# Patient Record
Sex: Male | Born: 1970
Health system: Southern US, Community
[De-identification: ages and names within clinical notes are randomized; demographics above are authoritative.]

## PROBLEM LIST (undated history)

## (undated) DIAGNOSIS — I10 Essential (primary) hypertension: Secondary | ICD-10-CM

## (undated) DIAGNOSIS — D696 Thrombocytopenia, unspecified: Secondary | ICD-10-CM

## (undated) DIAGNOSIS — I509 Heart failure, unspecified: Secondary | ICD-10-CM

## (undated) DIAGNOSIS — E669 Obesity, unspecified: Secondary | ICD-10-CM

## (undated) DIAGNOSIS — E119 Type 2 diabetes mellitus without complications: Secondary | ICD-10-CM

---

## 2006-12-29 ENCOUNTER — Emergency Department (HOSPITAL_COMMUNITY): Admission: EM | Admit: 2006-12-29 | Discharge: 2006-12-29 | Payer: Self-pay | Admitting: Emergency Medicine

## 2008-01-18 ENCOUNTER — Emergency Department (HOSPITAL_COMMUNITY): Admission: EM | Admit: 2008-01-18 | Discharge: 2008-01-18 | Payer: Self-pay | Admitting: Emergency Medicine

## 2008-08-02 ENCOUNTER — Emergency Department (HOSPITAL_COMMUNITY): Admission: EM | Admit: 2008-08-02 | Discharge: 2008-08-03 | Payer: Self-pay | Admitting: Emergency Medicine

## 2008-08-05 ENCOUNTER — Emergency Department (HOSPITAL_COMMUNITY): Admission: EM | Admit: 2008-08-05 | Discharge: 2008-08-05 | Payer: Self-pay | Admitting: Emergency Medicine

## 2008-08-06 ENCOUNTER — Emergency Department (HOSPITAL_COMMUNITY): Admission: EM | Admit: 2008-08-06 | Discharge: 2008-08-06 | Payer: Self-pay | Admitting: Emergency Medicine

## 2010-03-15 ENCOUNTER — Emergency Department (HOSPITAL_COMMUNITY)
Admission: EM | Admit: 2010-03-15 | Discharge: 2010-03-15 | Payer: Self-pay | Source: Home / Self Care | Admitting: Emergency Medicine

## 2010-04-22 ENCOUNTER — Emergency Department (HOSPITAL_COMMUNITY)
Admission: EM | Admit: 2010-04-22 | Discharge: 2010-04-22 | Payer: Self-pay | Source: Home / Self Care | Admitting: Emergency Medicine

## 2010-04-25 ENCOUNTER — Emergency Department (HOSPITAL_COMMUNITY)
Admission: EM | Admit: 2010-04-25 | Discharge: 2010-04-25 | Payer: Self-pay | Source: Home / Self Care | Admitting: Emergency Medicine

## 2010-04-27 ENCOUNTER — Emergency Department (HOSPITAL_COMMUNITY)
Admission: EM | Admit: 2010-04-27 | Discharge: 2010-04-27 | Payer: Self-pay | Source: Home / Self Care | Admitting: Emergency Medicine

## 2010-07-09 LAB — CBC
HCT: 43.2 % (ref 39.0–52.0)
MCHC: 34.9 g/dL (ref 30.0–36.0)
Platelets: 108 10*3/uL — ABNORMAL LOW (ref 150–400)
RDW: 13.2 % (ref 11.5–15.5)

## 2010-07-09 LAB — DIFFERENTIAL
Basophils Absolute: 0 10*3/uL (ref 0.0–0.1)
Basophils Relative: 0 % (ref 0–1)
Eosinophils Relative: 0 % (ref 0–5)
Lymphocytes Relative: 26 % (ref 12–46)
Neutro Abs: 4.9 10*3/uL (ref 1.7–7.7)

## 2010-07-09 LAB — POCT I-STAT, CHEM 8
BUN: 17 mg/dL (ref 6–23)
Calcium, Ion: 1.1 mmol/L — ABNORMAL LOW (ref 1.12–1.32)
Chloride: 105 mEq/L (ref 96–112)
Creatinine, Ser: 0.9 mg/dL (ref 0.4–1.5)
Glucose, Bld: 86 mg/dL (ref 70–99)
TCO2: 28 mmol/L (ref 0–100)

## 2010-07-09 LAB — WOUND CULTURE

## 2010-07-09 LAB — CULTURE, BLOOD (ROUTINE X 2)

## 2010-08-13 NOTE — Op Note (Signed)
Dave Arnold, Dave Arnold NO.:  [id number]   MEDICAL RECORD NO.:  [id number]          PATIENT TYPE:  EMS   LOCATION:  MAJO                         FACILITY:  [facility]   PHYSICIAN:  Dionne Ano. Gramig, M.D.DATE OF BIRTH:  1971/01/18   DATE OF PROCEDURE:  DATE OF DISCHARGE:  08/05/2008                               OPERATIVE REPORT   PREOPERATIVE DIAGNOSIS:  Status post dog bite on Aug 03, 2008, with  subsequent abscess formation and ascending cellulitis.   POSTOPERATIVE DIAGNOSIS:  Status post dog bite on Aug 03, 2008, with  subsequent abscess formation and ascending cellulitis.   PROCEDURE:  Incision and drainage abscess x3.  This is due to 3 deep  lacerations secondary to a dog bite with ascending cellulitis.   SURGEON:  Dionne Ano. [name], MD   ASSISTANT:  None.   COMPLICATIONS:  None.   INDICATIONS FOR THE PROCEDURE:  The patient is a 40 year old male who  presents with the above-mentioned diagnosis.  I have counseled him in  regards to risks and benefits of the surgery including risk of  infection, bleeding, anesthesia, damage to normal structures, and  failure of surgery to accomplish its intended goals of relieving  symptoms and restoring function.  With this in mind, he decided to  proceed.  All questions have been encouraged and answered  preoperatively.   OPERATIVE PROCEDURE:  The patient was seen by myself and counseled  extensively.  He was then taken to the procedure suite and underwent  anesthetic in the form of a field block anesthesia with lidocaine  without epinephrine.  He tolerated this well.  Once this was done, he  was scrubbed with Betadine scrub and painted x2 with separate scrubs.  Once this was done, I then performed I&D of skin, subcutaneous tissue  and muscle about 3 distinct and separate dog bites, abscess was noted,  this was cultured and the culture was sent for aerobic and anaerobic  cultures.  Following this, I irrigated with greater  than 2 L of saline  and then packed the wounds open with Iodoform gauze.  He tolerated the  procedure well.  He was dressed with Neosporin on the skin, gauze,  Kerlix, and an Ace wrap.  Following this, I discussed with him Augmentin  875 mg 1 p.o. b.i.d.  I do not feel that he should substitute this and  he clearly understands this.  This is specific organisms such as  Pasteurella multocida and other noxious bacteria in a dog's mouth.  I  have discussed with the patient.  He should return to see me immediately  tomorrow morning.  Given his childcare issues and other issues, we are  going to allow him to have IV Zosyn in the ER and then follow up with [place]  tomorrow.  I have his name and number and we will call him to arrange  for followup.  We have gone over these issues in detail, the do's and  dont's etc.   The patient tolerated the procedure well with no complicating features.  This was I&D of 3 separate deep abscesses including skin, subcutaneous  tissue and muscle.      Dionne Ano. [name], M.D.  Electronically Signed     WMG/MEDQ  D:  08/05/2008  T:  08/06/2008  Job:  [id number]

## 2010-08-13 NOTE — Consult Note (Signed)
NAMEMASTER, TOUCHET NO.:  [id number]   MEDICAL RECORD NO.:  [id number]          PATIENT TYPE:  EMS   LOCATION:  MAJO                         FACILITY:  [facility]   PHYSICIAN:  Dionne Ano. Gramig, M.D.DATE OF BIRTH:  08/14/1970   DATE OF CONSULTATION:  DATE OF DISCHARGE:  08/05/2008                                 CONSULTATION   I had the pleasure of seeing Dave Arnold in the emergency room.  The patient was seen by emergency room staff.  I was asked to see and  take over his care given his upper extremity predicament.  He was bitten  by a dog on Aug 03, 2008.  He was seen in the emergency room.  He was  given Augmentin at that time, but later called back and was changed to  doxycycline as he was trying to find a cheaper antibiotic.  This was  reviewed with the ER staff.  Today, Dr. [name] saw the patient,  was concerned about his arm and asked me to review this.  On review, the  patient had obvious purulence in the forearm and I have discussed this  with the patient.  He denies numbness in the fingertips.  He is alert  and oriented.  The dog was a Engineer, water and has been picked up for  observation.  The patient does complain of pain and worsening with  ascending erythema and cellulitis.   PAST MEDICAL HISTORY:  None.   PAST SURGICAL HISTORY:  None.   MEDICINES:  None.   ALLERGIES:  None.   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  He does not smoke  or drink.  He has a 40 and a 84 year old daughter.  He is not married.   On exam, he is alert and oriented in no acute distress.  Vital signs  stable.  The patient has normal sensation to the hand, left upper  extremity.  He has 3 large wounds with purulence about the volar forearm  and multiple abrasions and scratches.  He has no evidence of instability  about the bony architecture.  The patient has ascending erythema and  cellulitis of the arm to the area  just proximal to the  antecubital  crease.  I have reviewed this with him at length and its findings.  Elbow is stable with ligamentous exam.  Right upper extremity is  neurovascularly intact, normal alignment and good range of motion.   IMPRESSION:  Status post dog bite on Aug 03, 2008, with abscess, volar  left forearm x3, separate bite wounds.   PLAN:  I have discussed with the patient his findings.  I have consented  him for I&D and repair as necessary.  He understands the risk and  benefits, and desires to proceed.      Dionne Ano. [name], M.D.  Electronically Signed    WMG/MEDQ  D:  08/05/2008  T:  08/06/2008  Job:  [id number]

## 2010-08-13 NOTE — Op Note (Signed)
[name], [name] NO.:  [id number]   MEDICAL RECORD NO.:  [id number]          PATIENT TYPE:  EMS   LOCATION:  MAJO                         FACILITY:  [facility]   PHYSICIAN:  Dionne Ano. Gramig, M.D.DATE OF BIRTH:  Aug 24, 1970   DATE OF PROCEDURE:  DATE OF DISCHARGE:                               OPERATIVE REPORT   Dave Arnold was seen at the [facility].  He was seen  yesterday and underwent I and D'd of the dog bite, IV antibiotics  secondary to ascending cellulitis and abscess.  He presents to the  emergency room this morning for repeat evaluation and I and D.   On repeat evaluation, erythema is less than significantly.  He is taking  the Augmentin as prescribed.   I have gone ahead and cleansed 3 separate dog bite wounds with skin,  subcutaneous tissue.  Excisional debridement of all three wounds, packed  this with Iodoform and cleansed the area nicely.  He is going to see me  back in my office tomorrow morning for additional wound check at 11 a.m.  Continue his Augmentin, elevation, and general postop care plan has been  instituted.   We will continue aggressive course of care for him in hopes to rid him  of his infection.  He has made ample improvement over the last 24 hours  and thus we will continue with outpatient postsurgical treatment plan.       Dionne Ano. [name], M.D.  Electronically Signed     WMG/MEDQ  D:  08/06/2008  T:  08/07/2008  Job:  [id number]

## 2010-12-31 LAB — DIFFERENTIAL
Eosinophils Absolute: 0
Eosinophils Relative: 0
Lymphocytes Relative: 23
Lymphs Abs: 2.6
Monocytes Relative: 4
Neutrophils Relative %: 73

## 2010-12-31 LAB — URINALYSIS, ROUTINE W REFLEX MICROSCOPIC
Leukocytes, UA: NEGATIVE
Nitrite: NEGATIVE
Specific Gravity, Urine: 1.03
Urobilinogen, UA: 1
pH: 6

## 2010-12-31 LAB — COMPREHENSIVE METABOLIC PANEL
ALT: 21
AST: 18
CO2: 26
Calcium: 8.7
Creatinine, Ser: 1.07
GFR calc Af Amer: >60
GFR calc non Af Amer: >60
Sodium: 141
Total Protein: 6.4

## 2010-12-31 LAB — CBC
MCHC: 34.6
MCV: 87
RBC: 4.96
RDW: 13.2

## 2010-12-31 LAB — URINE MICROSCOPIC-ADD ON

## 2010-12-31 LAB — LIPASE, BLOOD: Lipase: 12

## 2011-06-30 DIAGNOSIS — I509 Heart failure, unspecified: Secondary | ICD-10-CM

## 2011-06-30 DIAGNOSIS — I5021 Acute systolic (congestive) heart failure: Secondary | ICD-10-CM | POA: Diagnosis present

## 2011-06-30 HISTORY — DX: Heart failure, unspecified: I50.9

## 2011-07-18 ENCOUNTER — Inpatient Hospital Stay (HOSPITAL_COMMUNITY)
Admission: EM | Admit: 2011-07-18 | Discharge: 2011-07-21 | DRG: 292 | Disposition: A | Discharge status: Home / Self Care | Payer: Self-pay | Attending: Internal Medicine | Admitting: Internal Medicine

## 2011-07-18 ENCOUNTER — Encounter (HOSPITAL_COMMUNITY): Payer: Self-pay | Admitting: Adult Health

## 2011-07-18 ENCOUNTER — Emergency Department (HOSPITAL_COMMUNITY): Payer: Self-pay

## 2011-07-18 DIAGNOSIS — Z6841 Body Mass Index (BMI) 40.0 and over, adult: Secondary | ICD-10-CM

## 2011-07-18 DIAGNOSIS — R7309 Other abnormal glucose: Secondary | ICD-10-CM | POA: Diagnosis present

## 2011-07-18 DIAGNOSIS — I509 Heart failure, unspecified: Secondary | ICD-10-CM | POA: Diagnosis present

## 2011-07-18 DIAGNOSIS — I110 Hypertensive heart disease with heart failure: Principal | ICD-10-CM | POA: Diagnosis present

## 2011-07-18 DIAGNOSIS — I10 Essential (primary) hypertension: Secondary | ICD-10-CM | POA: Diagnosis present

## 2011-07-18 DIAGNOSIS — I5021 Acute systolic (congestive) heart failure: Secondary | ICD-10-CM | POA: Diagnosis present

## 2011-07-18 DIAGNOSIS — E876 Hypokalemia: Secondary | ICD-10-CM | POA: Diagnosis present

## 2011-07-18 DIAGNOSIS — G4733 Obstructive sleep apnea (adult) (pediatric): Secondary | ICD-10-CM | POA: Diagnosis present

## 2011-07-18 DIAGNOSIS — D696 Thrombocytopenia, unspecified: Secondary | ICD-10-CM | POA: Diagnosis present

## 2011-07-18 DIAGNOSIS — E669 Obesity, unspecified: Secondary | ICD-10-CM | POA: Diagnosis present

## 2011-07-18 DIAGNOSIS — I517 Cardiomegaly: Secondary | ICD-10-CM | POA: Diagnosis present

## 2011-07-18 HISTORY — DX: Heart failure, unspecified: I50.9

## 2011-07-18 HISTORY — DX: Obesity, unspecified: E66.9

## 2011-07-18 HISTORY — DX: Essential (primary) hypertension: I10

## 2011-07-18 HISTORY — DX: Thrombocytopenia, unspecified: D69.6

## 2011-07-18 LAB — DIFFERENTIAL
Basophils Relative: 1 % (ref 0–1)
Eosinophils Absolute: 0.1 10*3/uL (ref 0.0–0.7)
Eosinophils Relative: 1 % (ref 0–5)
Lymphs Abs: 1.2 10*3/uL (ref 0.7–4.0)
Monocytes Absolute: 0.7 10*3/uL (ref 0.1–1.0)

## 2011-07-18 LAB — BASIC METABOLIC PANEL
CO2: 27 mEq/L (ref 19–32)
Calcium: 9 mg/dL (ref 8.4–10.5)
Creatinine, Ser: 0.95 mg/dL (ref 0.50–1.35)
GFR calc non Af Amer: >90 mL/min (ref >90)
Glucose, Bld: 144 mg/dL — ABNORMAL HIGH (ref 70–99)
Sodium: 140 mEq/L (ref 135–145)

## 2011-07-18 LAB — PRO B NATRIURETIC PEPTIDE: Pro B Natriuretic peptide (BNP): 1756 pg/mL — ABNORMAL HIGH (ref 0–125)

## 2011-07-18 LAB — COMPREHENSIVE METABOLIC PANEL
BUN: 13 mg/dL (ref 6–23)
CO2: 28 mEq/L (ref 19–32)
Calcium: 9.2 mg/dL (ref 8.4–10.5)
Creatinine, Ser: 0.95 mg/dL (ref 0.50–1.35)
GFR calc Af Amer: >90 mL/min (ref >90)
GFR calc non Af Amer: >90 mL/min (ref >90)
Glucose, Bld: 125 mg/dL — ABNORMAL HIGH (ref 70–99)
Total Protein: 7.3 g/dL (ref 6.0–8.3)

## 2011-07-18 LAB — CBC
HCT: 39.6 % (ref 39.0–52.0)
Hemoglobin: 13.6 g/dL (ref 13.0–17.0)
Hemoglobin: 13.5 g/dL (ref 13.0–17.0)
MCH: 27.7 pg (ref 26.0–34.0)
MCHC: 33.6 g/dL (ref 30.0–36.0)
MCV: 82.5 fL (ref 78.0–100.0)
MCV: 82.4 fL (ref 78.0–100.0)
RBC: 4.88 MIL/uL (ref 4.22–5.81)
WBC: 5.3 10*3/uL (ref 4.0–10.5)

## 2011-07-18 LAB — MAGNESIUM: Magnesium: 2 mg/dL (ref 1.5–2.5)

## 2011-07-18 LAB — POCT I-STAT TROPONIN I: Troponin i, poc: 0.06 ng/mL (ref 0.00–0.08)

## 2011-07-18 LAB — D-DIMER, QUANTITATIVE: D-Dimer, Quant: 0.22 ug/mL-FEU (ref 0.00–0.48)

## 2011-07-18 MED ORDER — POTASSIUM CHLORIDE 10 MEQ/100ML IV SOLN
10.0000 meq | Freq: Once | INTRAVENOUS | Status: AC
  Administered 2011-07-18: 10 meq via INTRAVENOUS
  Filled 2011-07-18: qty 100

## 2011-07-18 MED ORDER — FUROSEMIDE 10 MG/ML IJ SOLN
40.0000 mg | Freq: Once | INTRAMUSCULAR | Status: AC
  Administered 2011-07-18: 40 mg via INTRAVENOUS
  Filled 2011-07-18: qty 4

## 2011-07-18 MED ORDER — ASPIRIN 81 MG PO CHEW
324.0000 mg | CHEWABLE_TABLET | Freq: Once | ORAL | Status: AC
  Administered 2011-07-18: 324 mg via ORAL
  Filled 2011-07-18: qty 4

## 2011-07-18 MED ORDER — NITROGLYCERIN 2 % TD OINT
1.0000 [in_us] | TOPICAL_OINTMENT | Freq: Once | TRANSDERMAL | Status: AC
  Administered 2011-07-18: 1 [in_us] via TOPICAL
  Filled 2011-07-18: qty 30

## 2011-07-18 MED ORDER — POTASSIUM CHLORIDE CRYS ER 20 MEQ PO TBCR
20.0000 meq | EXTENDED_RELEASE_TABLET | Freq: Once | ORAL | Status: AC
  Administered 2011-07-18: 20 meq via ORAL
  Filled 2011-07-18: qty 1

## 2011-07-18 NOTE — H&P (Addendum)
PCP:  No primary provider on file.  He does not have any doctors   Chief Complaint:  Dyspnea on exertion  HPI: 40yoM with h/o obesity, HTN, now presents with new massive cardiomegaly.   Pt is decent historian, although requires frequent redirection. He states that for the  past several months he has progressively gotten more dyspneic with less and less  exertion, very wiped out and fatigued with minimal exertion like walking into and out of  a gas station. There is no clear exertional angina or chest pain, but more so a feeling  of fullness in his abdomen and lower chest, but not clear angina. He does get dizzy and  possibly presyncopal with exertion. He gets the sensation of choking in his throat when  he lays down flat and sleeps with 3 pillows to keep himself upright, or has to sleep on  his stomach. There has been minimal ankle swelling but his abdomen he notes feels very  distended and uncomfortable.   In the ED, HR max 107, HTN up to 178/95, otherwise stable. Labs with hypoK 2.9-3.2,  normal renal, normal LFT's. BNP 1756 and negative Trop. CBC normal except plts low at  103. DDimer was negative. CXR showed marked cardiomegaly and mild interstitial edema. He  was given 325 ASA, 40 mg IV Lasix, 1 inch nitro paste, 10 mEq KCl IV and 20 PO.   On further questioning, he endorses frequent sweatiness but no frank fevers and hasn't  measured his temp. He denies any flu-like symptoms in the past several months, denies any  n/v/d/abd pain, just abd distention. States when he was in prison years ago he was taking  his BP meds and potassium orally, but when released in 2008 he "let himself go" and got  more obese, not taking BP meds, not caring for himself. He has been under a lot of stress  with fighting with his wife. He denies any drinking or drugs, had negative HIV test years  ago and denies IVDU, risky sex behavior. Denies any prior AMI symptoms, no know cardiac  issues. ROS otherwise  negative.    Past Medical History  Diagnosis Date  . Hypertension   . Obesity   . Heart failure of unknown type 06/2011  . Thrombocytopenia     History reviewed. No pertinent past surgical history.  Medications:  HOME MEDS: He does not take any daily meds  Prior to Admission medications   Medication Sig Start Date End Date Taking? Authorizing Provider  dextromethorphan-guaiFENesin (MUCINEX DM) 30-600 MG per 12 hr tablet Take 1 tablet by mouth every 12 (twelve) hours.   Yes Historical Provider, MD  diphenhydrAMINE (BENADRYL) 25 MG tablet Take 25 mg by mouth every 6 (six) hours as needed. For seasonal allergies   Yes Historical Provider, MD  ibuprofen (ADVIL,MOTRIN) 400 MG tablet Take 400 mg by mouth every 6 (six) hours as needed. For pain   Yes Historical Provider, MD    Allergies:  No Known Allergies  Social History:   reports that he has never smoked. He does not have any smokeless tobacco history on file. He reports that he does not drink alcohol or use illicit drugs. He is married and has two daughters, was in prison years ago but released in 2008. Denies any smoking, drinking, or drugs.   Family History: History reviewed. No pertinent family history.  Physical Exam: Filed Vitals:   07/18/11 2130 07/18/11 2145 07/18/11 2200 07/18/11 2209  BP: 170/99 171/86 168/80 168/80  Pulse: 99 94 91 104  Temp:    98.9 F (37.2 C)  TempSrc:    Oral  Resp:    20  SpO2:    99%   Blood pressure 168/80, pulse 104, temperature 98.9 F (37.2 C), temperature source Oral, resp. rate 20, SpO2 99.00%. Gen: Squat, quite obese M who is diaphoretic, tachypneic but not distressed or using  accessory muscles, no increased WOB, just breathing fast, but can speak full sentences  without breathlessness. No distress, moderately good historian HEENT: Pupils round, reactive, EOMI, sclera clear and normal appearing,  irises/conjunctivae normal. Mouth moist but full appearing Neck: Thick and round.  Quite difficult to discern jugular pulsations, do note pulsations  about 7-8 cm up, but these are carotid Lungs: CTAB no w/c/r, good air movement, they do not sound frankly wet at all Heart: S1/2 appreciated and I do not heard adventitious S3/4 or any m/g, borderline  tachycardic and regular Abd: Soft, quite distended, but not rigid or very tight, no tenderness or facial  grimacing.  Extrem: Increased bulkiness, but normal tone. Wamr hands and feet, radials a bit hard to  palpate due to habitus but palpable. BLE's have small amt of hyperpigmented venous stasis  changes and minimal amt of peri-ankle soft edema but not much Neuro: Alert, attentive conversant, CN 2-12 intact, moves extremities on his own, grossly  non-focal    Labs & Imaging Results for orders placed during the hospital encounter of 07/18/11 (from the past 48 hour(s))  CBC     Status: Abnormal   Collection Time   07/18/11  7:00 PM      Component Value Range Comment   WBC 5.3  4.0 - 10.5 (K/uL)    RBC 4.80  4.22 - 5.81 (MIL/uL)    Hemoglobin 13.6  13.0 - 17.0 (g/dL)    HCT 16.1  09.6 - 04.5 (%)    MCV 82.5  78.0 - 100.0 (fL)    MCH 28.3  26.0 - 34.0 (pg)    MCHC 34.3  30.0 - 36.0 (g/dL)    RDW 40.9  81.1 - 91.4 (%)    Platelets 103 (*) 150 - 400 (K/uL)   BASIC METABOLIC PANEL     Status: Abnormal   Collection Time   07/18/11  7:00 PM      Component Value Range Comment   Sodium 140  135 - 145 (mEq/L)    Potassium 3.2 (*) 3.5 - 5.1 (mEq/L)    Chloride 103  96 - 112 (mEq/L)    CO2 27  19 - 32 (mEq/L)    Glucose, Bld 144 (*) 70 - 99 (mg/dL)    BUN 12  6 - 23 (mg/dL)    Creatinine, Ser 7.82  0.50 - 1.35 (mg/dL)    Calcium 9.0  8.4 - 10.5 (mg/dL)    GFR calc non Af Amer >90  >90 (mL/min)    GFR calc Af Amer >90  >90 (mL/min)   PRO B NATRIURETIC PEPTIDE     Status: Abnormal   Collection Time   07/18/11  7:00 PM      Component Value Range Comment   Pro B Natriuretic peptide (BNP) 1756.0 (*) 0 - 125 (pg/mL)   CBC      Status: Abnormal   Collection Time   07/18/11  7:38 PM      Component Value Range Comment   WBC 5.0  4.0 - 10.5 (K/uL)    RBC 4.88  4.22 - 5.81 (MIL/uL)  Hemoglobin 13.5  13.0 - 17.0 (g/dL)    HCT 40.9  81.1 - 91.4 (%)    MCV 82.4  78.0 - 100.0 (fL)    MCH 27.7  26.0 - 34.0 (pg)    MCHC 33.6  30.0 - 36.0 (g/dL)    RDW 78.2  95.6 - 21.3 (%)    Platelets 104 (*) 150 - 400 (K/uL) PLATELET COUNT CONFIRMED BY SMEAR  DIFFERENTIAL     Status: Abnormal   Collection Time   07/18/11  7:38 PM      Component Value Range Comment   Neutrophils Relative 61  43 - 77 (%)    Lymphocytes Relative 24  12 - 46 (%)    Monocytes Relative 13 (*) 3 - 12 (%)    Eosinophils Relative 1  0 - 5 (%)    Basophils Relative 1  0 - 1 (%)    Neutro Abs 2.9  1.7 - 7.7 (K/uL)    Lymphs Abs 1.2  0.7 - 4.0 (K/uL)    Monocytes Absolute 0.7  0.1 - 1.0 (K/uL)    Eosinophils Absolute 0.1  0.0 - 0.7 (K/uL)    Basophils Absolute 0.1  0.0 - 0.1 (K/uL)    Smear Review PLATELET COUNT CONFIRMED BY SMEAR     COMPREHENSIVE METABOLIC PANEL     Status: Abnormal   Collection Time   07/18/11  7:38 PM      Component Value Range Comment   Sodium 141  135 - 145 (mEq/L)    Potassium 2.9 (*) 3.5 - 5.1 (mEq/L)    Chloride 103  96 - 112 (mEq/L)    CO2 28  19 - 32 (mEq/L)    Glucose, Bld 125 (*) 70 - 99 (mg/dL)    BUN 13  6 - 23 (mg/dL)    Creatinine, Ser 0.86  0.50 - 1.35 (mg/dL)    Calcium 9.2  8.4 - 10.5 (mg/dL)    Total Protein 7.3  6.0 - 8.3 (g/dL)    Albumin 4.0  3.5 - 5.2 (g/dL)    AST 26  0 - 37 (U/L)    ALT 24  0 - 53 (U/L)    Alkaline Phosphatase 79  39 - 117 (U/L)    Total Bilirubin 0.7  0.3 - 1.2 (mg/dL)    GFR calc non Af Amer >90  >90 (mL/min)    GFR calc Af Amer >90  >90 (mL/min)   D-DIMER, QUANTITATIVE     Status: Normal   Collection Time   07/18/11  7:38 PM      Component Value Range Comment   D-Dimer, Quant 0.22  0.00 - 0.48 (ug/mL-FEU)   MAGNESIUM     Status: Normal   Collection Time   07/18/11  7:38 PM       Component Value Range Comment   Magnesium 2.0  1.5 - 2.5 (mg/dL)   POCT I-STAT TROPONIN I     Status: Normal   Collection Time   07/18/11  7:45 PM      Component Value Range Comment   Troponin i, poc 0.06  0.00 - 0.08 (ng/mL)    Comment 3             Dg Chest 2 View  07/18/2011  *RADIOLOGY REPORT*  Clinical Data: Right-sided chest pain for 1 week.  Shortness of breath, hypertension, pleurisy.  CHEST - 2 VIEW  Comparison: 01/18/2008  Findings: The heart is enlarged.  There is prominence of interstitial markings, consistent with  interstitial edema.  There are no focal consolidations.  No overt alveolar edema. Degenerative changes are seen in the spine.  IMPRESSION: Marked cardiomegaly and mild interstitial edema.  Original Report Authenticated By: Patterson Hammersmith, M.D.    ECG: NSR 113 bpm, LAD and LAFB, LAE in V1, normal PR, narrow QRS with late RWP. No Q  waves. No ischemic ST changes. Flat T waves laterally/high lateral. Otherwise normal.    Impression Present on Admission:  .Heart failure of unknown type .Obesity .Thrombocytopenia .Hypertension  40yoM with h/o obesity, HTN, now presents with new massive cardiomegaly.   1. New diagnosis of heart failure: Unclear if systolic or diastolic. Overall, suspect  etiology is diastolic due to body habitus/obesity, longstanding HTN, and probably  undiagnosed obstructive sleep apnea too. He may have diabetes as well. Alternatively he  may have systolic failure due to chronic coronary ischemia, but he does not have history  or ECG findings to suggest prior acute infarctions. Nothing overwhelming to suggest prior  myopericarditis or other viral syndromes. He denies any risk factors for HIV and had  prior negative test. He denies any alcohol intake. Does endorse a lot of stress, so  Takotsubo's considered.   - Admit CHF protocol. Diuresis, although pt without a lot of volume overload, may help  with cardiopulmonary decongestion, lasix 20 mg  IV q8 x3 doses, MD titrate after. Start  carvedilol and lisinopril. ASA 325 for now, decrease if rules out  - A1c, lipid panel, TSH, iron studies, HIV test, ANA, trend enzymes and ECG. Hold on SPEP  for now but can consider. 2d echo. Needs cardiology consultation and suspect he may need  catheterization to r/o coronary obstruction.   2. Hypokalemia: Not sure how this relates to above. He was taking PO potassium when he  was in prison, so this is apparently chronic. No apparent GI losses. Repletion with IV and PO - Urine potassium, if > 15 mEq/L suggests renal losses.   3. Thrombocytopenia: Not clear this etiology either. Doesn't take any daily medications.  Hepatic/splenic congestion from #1 possible. Trending for now, will get abdominal  ultrasound given complaints of abd distention.   Lovenox prophylaxis  Telemetry, [facility] team 5 Full code, discussed with pt   Other plans as per orders.  Andretta Ergle 07/18/2011, 11:18 PM

## 2011-07-18 NOTE — ED Notes (Signed)
PA, [place] at bedside.

## 2011-07-18 NOTE — ED Notes (Signed)
C/o right sided chest pain that is sharp, SOB and pain with deep inspiration ongoing for 3 months. EKG completed in triage. C/o inability to lay flat due to SOB.

## 2011-07-18 NOTE — ED Notes (Signed)
MD at bedside. Dr. Bono at bedside.  

## 2011-07-18 NOTE — ED Provider Notes (Signed)
History     CSN: [id number]  Arrival date & time 07/18/11  3086   First MD Initiated Contact with Patient 07/18/11 2002      Chief Complaint  Patient presents with  . Chest Pain  . Pleurisy    (Consider location/radiation/quality/duration/timing/severity/associated sxs/prior treatment) HPI Comments: Patient here with a several month history of right anterior chest wall pain - states has gotten much worse over the past 4 days with now the inability to lie flat without severe shortness of breath.  States the pain increases with deep inspiration and cough, reports progressive worsening of pain and lower extremity edema.  States that he has a history of HTN but has not been on medication for this for years.  No fever, chills, productive cough, long trip, calf pain.  States that he has been trying to work out to reduce his weight but his shortness of breath makes this difficult.  States that at night he awakens with acute shortness of breath.  Patient is a 41 y.o. male presenting with chest pain. The history is provided by the patient. No language interpreter was used.  Chest Pain The chest pain began more  than 1 month ago. Chest pain occurs constantly. The chest pain is unchanged. The pain is associated with breathing, coughing and exertion. At its most intense, the pain is at 5/10. The pain is currently at 5/10. The severity of the pain is moderate. The quality of the pain is described as sharp. Chest pain is worsened by certain positions. Primary symptoms include fatigue, shortness of breath and cough. Pertinent negatives for primary symptoms include no fever, no syncope, no wheezing, no palpitations, no abdominal pain, no nausea, no vomiting, no dizziness and no altered mental status.  Associated symptoms include lower extremity edema, near-syncope, orthopnea, paroxysmal nocturnal dyspnea and weakness.  Pertinent negatives for associated symptoms include no claudication, no diaphoresis and no  numbness. He tried nothing for the symptoms. Risk factors include lack of exercise, male gender and obesity.  His past medical history is significant for hypertension.     Past Medical History  Diagnosis Date  . Hypertension     History reviewed. No pertinent past surgical history.  History reviewed. No pertinent family history.  History  Substance Use Topics  . Smoking status: Never Smoker   . Smokeless tobacco: Not on file  . Alcohol Use: No      Review of Systems  Constitutional: Positive for fatigue. Negative for fever and diaphoresis.  Respiratory: Positive for cough and shortness of breath. Negative for wheezing.   Cardiovascular: Positive for chest pain, orthopnea, leg swelling and near-syncope. Negative for palpitations, claudication and syncope.  Gastrointestinal: Negative for nausea, vomiting and abdominal pain.  Neurological: Positive for weakness. Negative for dizziness and numbness.  Psychiatric/Behavioral: Negative for altered mental status.  All other systems reviewed and are negative.    Allergies  Review of patient's allergies indicates no known allergies.  Home Medications   Current Outpatient Rx  Name Route Sig Dispense Refill  . DM-GUAIFENESIN ER 30-600 MG PO TB12 Oral Take 1 tablet by mouth every 12 (twelve) hours.    Marland Kitchen DIPHENHYDRAMINE HCL 25 MG PO TABS Oral Take 25 mg by mouth every 6 (six) hours as needed. For seasonal allergies    . IBUPROFEN 400 MG PO TABS Oral Take 400 mg by mouth every 6 (six) hours as needed. For pain      There were no vitals taken for this visit.  Physical Exam  Nursing note and vitals reviewed. Constitutional: He is oriented to person, place, and time. He appears well-developed and well-nourished. No distress.  HENT:  Head: Normocephalic and atraumatic.  Right Ear: External ear normal.  Left Ear: External ear normal.  Nose: Nose normal.  Mouth/Throat: Oropharynx is clear and moist. No oropharyngeal exudate.    Eyes: Conjunctivae are normal. Pupils are equal, round, and reactive to light. No scleral icterus.  Neck: Normal range of motion. Neck supple. No JVD present.  Cardiovascular: Normal rate, regular rhythm and normal heart sounds.  Exam reveals no gallop and no friction rub.   No murmur heard. Pulmonary/Chest: Effort normal and breath sounds normal. No respiratory distress. He has no wheezes. He has no rales. He exhibits no tenderness.  Abdominal: Soft. Bowel sounds are normal. He exhibits no distension. There is no tenderness.  Musculoskeletal: Normal range of motion. He exhibits no edema and no tenderness.  Lymphadenopathy:    He has no cervical adenopathy.  Neurological: He is alert and oriented to person, place, and time. No cranial nerve deficit.  Skin: Skin is warm and dry. No rash noted. No erythema. No pallor.  Psychiatric: He has a normal mood and affect. His behavior is normal. Judgment and thought content normal.    ED Course  Procedures (including critical care time)  Labs Reviewed  CBC - Abnormal; Notable for the following:    Platelets 103 (*)    All other components within normal limits  BASIC METABOLIC PANEL - Abnormal; Notable for the following:    Potassium 3.2 (*)    Glucose, Bld 144 (*)    All other components within normal limits  PRO B NATRIURETIC PEPTIDE - Abnormal; Notable for the following:    Pro B Natriuretic peptide (BNP) 1756.0 (*)    All other components within normal limits  CBC - Abnormal; Notable for the following:    Platelets 104 (*) PLATELET COUNT CONFIRMED BY SMEAR   All other components within normal limits  COMPREHENSIVE METABOLIC PANEL - Abnormal; Notable for the following:    Potassium 2.9 (*)    Glucose, Bld 125 (*)    All other components within normal limits  POCT I-STAT TROPONIN I  DIFFERENTIAL  D-DIMER, QUANTITATIVE  MAGNESIUM   Dg Chest 2 View  07/18/2011  *RADIOLOGY REPORT*  Clinical Data: Right-sided chest pain for 1 week.   Shortness of breath, hypertension, pleurisy.  CHEST - 2 VIEW  Comparison: 01/18/2008  Findings: The heart is enlarged.  There is prominence of interstitial markings, consistent with interstitial edema.  There are no focal consolidations.  No overt alveolar edema. Degenerative changes are seen in the spine.  IMPRESSION: Marked cardiomegaly and mild interstitial edema.  Original Report Authenticated By: Patterson Hammersmith, M.D.   Results for orders placed during the hospital encounter of 07/18/11  CBC      Component Value Range   WBC 5.3  4.0 - 10.5 (K/uL)   RBC 4.80  4.22 - 5.81 (MIL/uL)   Hemoglobin 13.6  13.0 - 17.0 (g/dL)   HCT 40.9  81.1 - 91.4 (%)   MCV 82.5  78.0 - 100.0 (fL)   MCH 28.3  26.0 - 34.0 (pg)   MCHC 34.3  30.0 - 36.0 (g/dL)   RDW 78.2  95.6 - 21.3 (%)   Platelets 103 (*) 150 - 400 (K/uL)  BASIC METABOLIC PANEL      Component Value Range   Sodium 140  135 - 145 (mEq/L)   Potassium 3.2 (*)  3.5 - 5.1 (mEq/L)   Chloride 103  96 - 112 (mEq/L)   CO2 27  19 - 32 (mEq/L)   Glucose, Bld 144 (*) 70 - 99 (mg/dL)   BUN 12  6 - 23 (mg/dL)   Creatinine, Ser 1.61  0.50 - 1.35 (mg/dL)   Calcium 9.0  8.4 - 09.6 (mg/dL)   GFR calc non Af Amer >90  >90 (mL/min)   GFR calc Af Amer >90  >90 (mL/min)  PRO B NATRIURETIC PEPTIDE      Component Value Range   Pro B Natriuretic peptide (BNP) 1756.0 (*) 0 - 125 (pg/mL)  POCT I-STAT TROPONIN I      Component Value Range   Troponin i, poc 0.06  0.00 - 0.08 (ng/mL)   Comment 3           CBC      Component Value Range   WBC 5.0  4.0 - 10.5 (K/uL)   RBC 4.88  4.22 - 5.81 (MIL/uL)   Hemoglobin 13.5  13.0 - 17.0 (g/dL)   HCT 04.5  40.9 - 81.1 (%)   MCV 82.4  78.0 - 100.0 (fL)   MCH 27.7  26.0 - 34.0 (pg)   MCHC 33.6  30.0 - 36.0 (g/dL)   RDW 91.4  78.2 - 95.6 (%)   Platelets 104 (*) 150 - 400 (K/uL)  DIFFERENTIAL      Component Value Range   Neutrophils Relative 61  43 - 77 (%)   Lymphocytes Relative 24  12 - 46 (%)   Monocytes Relative  13 (*) 3 - 12 (%)   Eosinophils Relative 1  0 - 5 (%)   Basophils Relative 1  0 - 1 (%)   Neutro Abs 2.9  1.7 - 7.7 (K/uL)   Lymphs Abs 1.2  0.7 - 4.0 (K/uL)   Monocytes Absolute 0.7  0.1 - 1.0 (K/uL)   Eosinophils Absolute 0.1  0.0 - 0.7 (K/uL)   Basophils Absolute 0.1  0.0 - 0.1 (K/uL)   Smear Review PLATELET COUNT CONFIRMED BY SMEAR    COMPREHENSIVE METABOLIC PANEL      Component Value Range   Sodium 141  135 - 145 (mEq/L)   Potassium 2.9 (*) 3.5 - 5.1 (mEq/L)   Chloride 103  96 - 112 (mEq/L)   CO2 28  19 - 32 (mEq/L)   Glucose, Bld 125 (*) 70 - 99 (mg/dL)   BUN 13  6 - 23 (mg/dL)   Creatinine, Ser 2.13  0.50 - 1.35 (mg/dL)   Calcium 9.2  8.4 - 08.6 (mg/dL)   Total Protein 7.3  6.0 - 8.3 (g/dL)   Albumin 4.0  3.5 - 5.2 (g/dL)   AST 26  0 - 37 (U/L)   ALT 24  0 - 53 (U/L)   Alkaline Phosphatase 79  39 - 117 (U/L)   Total Bilirubin 0.7  0.3 - 1.2 (mg/dL)   GFR calc non Af Amer >90  >90 (mL/min)   GFR calc Af Amer >90  >90 (mL/min)  D-DIMER, QUANTITATIVE      Component Value Range   D-Dimer, Quant 0.22  0.00 - 0.48 (ug/mL-FEU)  MAGNESIUM      Component Value Range   Magnesium 2.0  1.5 - 2.5 (mg/dL)   Dg Chest 2 View  5/78/4696  *RADIOLOGY REPORT*  Clinical Data: Right-sided chest pain for 1 week.  Shortness of breath, hypertension, pleurisy.  CHEST - 2 VIEW  Comparison: 01/18/2008  Findings: The heart is  enlarged.  There is prominence of interstitial markings, consistent with interstitial edema.  There are no focal consolidations.  No overt alveolar edema. Degenerative changes are seen in the spine.  IMPRESSION: Marked cardiomegaly and mild interstitial edema.  Original Report Authenticated By: Patterson Hammersmith, M.D.     Date: 07/18/2011  Rate: 113  Rhythm: sinus tachycardia  QRS Axis: left  Intervals: normal  ST/T Wave abnormalities: nonspecific T wave changes  Conduction Disutrbances:none  Narrative Interpretation: Reviewed by Dr. Karma Ganja  Old EKG Reviewed: changes  noted    New onset CHF Hypokalemia     MDM  Patient here with worsening shortness of breath with pleuritic chest pain, though the chest pain may be significant, we are more concerned regarding the new congestive failure.  I have spoken with Dr. Angus Palms with [facility] and we will admit the patient for new onset CHF.  I have ordered lasix and potassium replacement, as well as, placing the patient on an inch of nitropaste.        Izola Price [place], [place] 07/18/11 2145

## 2011-07-18 NOTE — ED Notes (Signed)
Patient returned from X-ray 

## 2011-07-19 LAB — IRON AND TIBC
Iron: 36 ug/dL — ABNORMAL LOW (ref 42–135)
Saturation Ratios: 13 % — ABNORMAL LOW (ref 20–55)
UIBC: 246 ug/dL (ref 125–400)

## 2011-07-19 LAB — HIV ANTIBODY (ROUTINE TESTING W REFLEX): HIV: NONREACTIVE

## 2011-07-19 LAB — BASIC METABOLIC PANEL
BUN: 13 mg/dL (ref 6–23)
Creatinine, Ser: 1.04 mg/dL (ref 0.50–1.35)
GFR calc Af Amer: >90 mL/min (ref >90)
GFR calc non Af Amer: 90 mL/min — ABNORMAL LOW (ref >90)

## 2011-07-19 LAB — NA AND K (SODIUM & POTASSIUM), RAND UR
Potassium Urine: 62 meq/L
Sodium, Ur: 101 meq/L

## 2011-07-19 LAB — CARDIAC PANEL(CRET KIN+CKTOT+MB+TROPI)
CK, MB: 3.4 ng/mL (ref 0.3–4.0)
CK, MB: 3.3 ng/mL (ref 0.3–4.0)
Total CK: 114 U/L (ref 7–232)
Troponin I: <0.3 ng/mL (ref <0.30)

## 2011-07-19 LAB — FERRITIN: Ferritin: 107 ng/mL (ref 22–322)

## 2011-07-19 LAB — HEMOGLOBIN A1C: Mean Plasma Glucose: 111 mg/dL (ref <117)

## 2011-07-19 MED ORDER — CARVEDILOL 6.25 MG PO TABS
6.2500 mg | ORAL_TABLET | Freq: Two times a day (BID) | ORAL | Status: DC
  Administered 2011-07-19 (×2): 6.25 mg via ORAL
  Filled 2011-07-19 (×3): qty 1

## 2011-07-19 MED ORDER — SODIUM CHLORIDE 0.9 % IJ SOLN: 3.0000 mL | INTRAMUSCULAR | Status: DC | PRN

## 2011-07-19 MED ORDER — POTASSIUM CHLORIDE 10 MEQ/100ML IV SOLN
10.0000 meq | INTRAVENOUS | Status: AC
  Administered 2011-07-19 (×6): 10 meq via INTRAVENOUS
  Filled 2011-07-19 (×6): qty 100

## 2011-07-19 MED ORDER — CARVEDILOL 12.5 MG PO TABS
12.5000 mg | ORAL_TABLET | Freq: Two times a day (BID) | ORAL | Status: DC
  Administered 2011-07-19 – 2011-07-20 (×3): 12.5 mg via ORAL
  Filled 2011-07-19 (×7): qty 1

## 2011-07-19 MED ORDER — SPIRONOLACTONE 25 MG PO TABS
25.0000 mg | ORAL_TABLET | Freq: Two times a day (BID) | ORAL | Status: DC
  Administered 2011-07-20: 25 mg via ORAL
  Filled 2011-07-19 (×3): qty 1

## 2011-07-19 MED ORDER — ENOXAPARIN SODIUM 40 MG/0.4ML ~~LOC~~ SOLN
40.0000 mg | SUBCUTANEOUS | Status: DC
  Administered 2011-07-19 – 2011-07-21 (×3): 40 mg via SUBCUTANEOUS
  Filled 2011-07-19 (×4): qty 0.4

## 2011-07-19 MED ORDER — ASPIRIN EC 325 MG PO TBEC
325.0000 mg | DELAYED_RELEASE_TABLET | Freq: Every day | ORAL | Status: DC
  Administered 2011-07-19 – 2011-07-20 (×2): 325 mg via ORAL
  Filled 2011-07-19 (×4): qty 1

## 2011-07-19 MED ORDER — POTASSIUM CHLORIDE CRYS ER 20 MEQ PO TBCR
40.0000 meq | EXTENDED_RELEASE_TABLET | Freq: Two times a day (BID) | ORAL | Status: AC
  Administered 2011-07-19 (×2): 40 meq via ORAL
  Filled 2011-07-19 (×2): qty 2

## 2011-07-19 MED ORDER — ONDANSETRON HCL 4 MG/2ML IJ SOLN: 4.0000 mg | Freq: Four times a day (QID) | INTRAMUSCULAR | Status: DC | PRN

## 2011-07-19 MED ORDER — SODIUM CHLORIDE 0.9 % IV SOLN: 250.0000 mL | INTRAVENOUS | Status: DC | PRN

## 2011-07-19 MED ORDER — ACETAMINOPHEN 325 MG PO TABS
ORAL_TABLET | ORAL | Status: AC
  Administered 2011-07-19: 650 mg via ORAL
  Filled 2011-07-19: qty 2

## 2011-07-19 MED ORDER — FUROSEMIDE 10 MG/ML IJ SOLN
20.0000 mg | Freq: Three times a day (TID) | INTRAMUSCULAR | Status: DC
  Administered 2011-07-19: 20 mg via INTRAVENOUS
  Filled 2011-07-19 (×3): qty 2

## 2011-07-19 MED ORDER — ACETAMINOPHEN 325 MG PO TABS
650.0000 mg | ORAL_TABLET | ORAL | Status: DC | PRN
  Administered 2011-07-19: 650 mg via ORAL

## 2011-07-19 MED ORDER — LISINOPRIL 10 MG PO TABS
10.0000 mg | ORAL_TABLET | Freq: Every day | ORAL | Status: DC
  Administered 2011-07-19 – 2011-07-20 (×2): 10 mg via ORAL
  Filled 2011-07-19 (×4): qty 1

## 2011-07-19 MED ORDER — SODIUM CHLORIDE 0.9 % IJ SOLN
3.0000 mL | Freq: Two times a day (BID) | INTRAMUSCULAR | Status: DC
  Administered 2011-07-19: 3 mL via INTRAVENOUS

## 2011-07-19 NOTE — ED Provider Notes (Signed)
Medical screening examination/treatment/procedure(s) were performed by non-physician practitioner and as supervising physician I was immediately available for consultation/collaboration.  Ethelda Chick, MD 07/19/11 0001

## 2011-07-19 NOTE — Consult Note (Signed)
Reason for Consult: Chest pain/congestive heart failure Referring Physician: Patient is 41 year old male with past medical history significant for hypertension hypertensive heart disease with systolic dysfunction history of congestive heart failure secondary to systolic dysfunction morbid obesity chronic thrombocytopenia was admitted yesterday because of progressive increasing shortness of breath associated with right-sided chest pain for last few months. States his breathing has become worst lately . Patient denies any anginal chest pain nausea or vomiting diaphoresis. States chest pain occasionally increases with deep breathing. Denies any palpitation lightheadedness or syncope. Patient had 2-D echo done today which showed EF of approximately 35% with mild MR and AR and TR. Wall motion abnormalities could not be evaluated because of poor windows and body habitus. Patient denies any cardiac workup in the past. States he was incarcerated for approximately 8 years and was taking her blood pressure medication but has stopped on his own for last for 6 years. Patient denies any history of the PND orthopnea or leg swelling but states uses 2-3 pillows at night and occasionally feels choking sensation in the throat. Patient denies any cough fever chills. States feeling better since her yesterday    Past Medical History  Diagnosis Date  . Hypertension   . Obesity   . Heart failure of unknown type 06/2011  . Thrombocytopenia     History reviewed. No pertinent past surgical history.  History reviewed. No pertinent family history.  Social History:  reports that he has never smoked. He does not have any smokeless tobacco history on file. He reports that he does not drink alcohol or use illicit drugs.  Allergies: No Known Allergies  Medications: I have reviewed the patient's current medications.  Results for orders placed during the hospital encounter of 07/18/11 (from the past 48 hour(s))  CBC     Status:  Abnormal   Collection Time   07/18/11  7:00 PM      Component Value Range Comment   WBC 5.3  4.0 - 10.5 (K/uL)    RBC 4.80  4.22 - 5.81 (MIL/uL)    Hemoglobin 13.6  13.0 - 17.0 (g/dL)    HCT 62.9  52.8 - 41.3 (%)    MCV 82.5  78.0 - 100.0 (fL)    MCH 28.3  26.0 - 34.0 (pg)    MCHC 34.3  30.0 - 36.0 (g/dL)    RDW 24.4  01.0 - 27.2 (%)    Platelets 103 (*) 150 - 400 (K/uL)   BASIC METABOLIC PANEL     Status: Abnormal   Collection Time   07/18/11  7:00 PM      Component Value Range Comment   Sodium 140  135 - 145 (mEq/L)    Potassium 3.2 (*) 3.5 - 5.1 (mEq/L)    Chloride 103  96 - 112 (mEq/L)    CO2 27  19 - 32 (mEq/L)    Glucose, Bld 144 (*) 70 - 99 (mg/dL)    BUN 12  6 - 23 (mg/dL)    Creatinine, Ser 5.36  0.50 - 1.35 (mg/dL)    Calcium 9.0  8.4 - 10.5 (mg/dL)    GFR calc non Af Amer >90  >90 (mL/min)    GFR calc Af Amer >90  >90 (mL/min)   PRO B NATRIURETIC PEPTIDE     Status: Abnormal   Collection Time   07/18/11  7:00 PM      Component Value Range Comment   Pro B Natriuretic peptide (BNP) 1756.0 (*) 0 - 125 (pg/mL)  CBC     Status: Abnormal   Collection Time   07/18/11  7:38 PM      Component Value Range Comment   WBC 5.0  4.0 - 10.5 (K/uL)    RBC 4.88  4.22 - 5.81 (MIL/uL)    Hemoglobin 13.5  13.0 - 17.0 (g/dL)    HCT 40.9  81.1 - 91.4 (%)    MCV 82.4  78.0 - 100.0 (fL)    MCH 27.7  26.0 - 34.0 (pg)    MCHC 33.6  30.0 - 36.0 (g/dL)    RDW 78.2  95.6 - 21.3 (%)    Platelets 104 (*) 150 - 400 (K/uL) PLATELET COUNT CONFIRMED BY SMEAR  DIFFERENTIAL     Status: Abnormal   Collection Time   07/18/11  7:38 PM      Component Value Range Comment   Neutrophils Relative 61  43 - 77 (%)    Lymphocytes Relative 24  12 - 46 (%)    Monocytes Relative 13 (*) 3 - 12 (%)    Eosinophils Relative 1  0 - 5 (%)    Basophils Relative 1  0 - 1 (%)    Neutro Abs 2.9  1.7 - 7.7 (K/uL)    Lymphs Abs 1.2  0.7 - 4.0 (K/uL)    Monocytes Absolute 0.7  0.1 - 1.0 (K/uL)    Eosinophils Absolute  0.1  0.0 - 0.7 (K/uL)    Basophils Absolute 0.1  0.0 - 0.1 (K/uL)    Smear Review PLATELET COUNT CONFIRMED BY SMEAR     COMPREHENSIVE METABOLIC PANEL     Status: Abnormal   Collection Time   07/18/11  7:38 PM      Component Value Range Comment   Sodium 141  135 - 145 (mEq/L)    Potassium 2.9 (*) 3.5 - 5.1 (mEq/L)    Chloride 103  96 - 112 (mEq/L)    CO2 28  19 - 32 (mEq/L)    Glucose, Bld 125 (*) 70 - 99 (mg/dL)    BUN 13  6 - 23 (mg/dL)    Creatinine, Ser 0.86  0.50 - 1.35 (mg/dL)    Calcium 9.2  8.4 - 10.5 (mg/dL)    Total Protein 7.3  6.0 - 8.3 (g/dL)    Albumin 4.0  3.5 - 5.2 (g/dL)    AST 26  0 - 37 (U/L)    ALT 24  0 - 53 (U/L)    Alkaline Phosphatase 79  39 - 117 (U/L)    Total Bilirubin 0.7  0.3 - 1.2 (mg/dL)    GFR calc non Af Amer >90  >90 (mL/min)    GFR calc Af Amer >90  >90 (mL/min)   D-DIMER, QUANTITATIVE     Status: Normal   Collection Time   07/18/11  7:38 PM      Component Value Range Comment   D-Dimer, Quant 0.22  0.00 - 0.48 (ug/mL-FEU)   MAGNESIUM     Status: Normal   Collection Time   07/18/11  7:38 PM      Component Value Range Comment   Magnesium 2.0  1.5 - 2.5 (mg/dL)   POCT I-STAT TROPONIN I     Status: Normal   Collection Time   07/18/11  7:45 PM      Component Value Range Comment   Troponin i, poc 0.06  0.00 - 0.08 (ng/mL)    Comment 3  BASIC METABOLIC PANEL     Status: Abnormal   Collection Time   07/19/11  2:17 AM      Component Value Range Comment   Sodium 137  135 - 145 (mEq/L)    Potassium 3.0 (*) 3.5 - 5.1 (mEq/L)    Chloride 99  96 - 112 (mEq/L)    CO2 28  19 - 32 (mEq/L)    Glucose, Bld 145 (*) 70 - 99 (mg/dL)    BUN 13  6 - 23 (mg/dL)    Creatinine, Ser 1.30  0.50 - 1.35 (mg/dL)    Calcium 9.0  8.4 - 10.5 (mg/dL)    GFR calc non Af Amer 90 (*) >90 (mL/min)    GFR calc Af Amer >90  >90 (mL/min)   TSH     Status: Normal   Collection Time   07/19/11  2:17 AM      Component Value Range Comment   TSH 1.853  0.350 - 4.500  (uIU/mL)   CARDIAC PANEL(CRET KIN+CKTOT+MB+TROPI)     Status: Abnormal   Collection Time   07/19/11  2:17 AM      Component Value Range Comment   Total CK 148  7 - 232 (U/L)    CK, MB 3.1  0.3 - 4.0 (ng/mL)    Troponin I 0.31 (*) <0.30 (ng/mL)    Relative Index 2.1  0.0 - 2.5    MAGNESIUM     Status: Normal   Collection Time   07/19/11  2:17 AM      Component Value Range Comment   Magnesium 1.9  1.5 - 2.5 (mg/dL)   PROTIME-INR     Status: Normal   Collection Time   07/19/11  2:17 AM      Component Value Range Comment   Prothrombin Time 14.5  11.6 - 15.2 (seconds)    INR 1.11  0.00 - 1.49    HEMOGLOBIN A1C     Status: Normal   Collection Time   07/19/11  2:17 AM      Component Value Range Comment   Hemoglobin A1C 5.5  <5.7 (%)    Mean Plasma Glucose 111  <117 (mg/dL)   IRON AND TIBC     Status: Abnormal   Collection Time   07/19/11  2:17 AM      Component Value Range Comment   Iron 36 (*) 42 - 135 (ug/dL)    TIBC 865  784 - 696 (ug/dL)    Saturation Ratios 13 (*) 20 - 55 (%)    UIBC 246  125 - 400 (ug/dL)   FERRITIN     Status: Normal   Collection Time   07/19/11  2:17 AM      Component Value Range Comment   Ferritin 107  22 - 322 (ng/mL)   NA AND K (SODIUM & POTASSIUM), RAND UR     Status: Normal   Collection Time   07/19/11  5:12 AM      Component Value Range Comment   Sodium, Ur 101      Potassium Urine Timed 62     CARDIAC PANEL(CRET KIN+CKTOT+MB+TROPI)     Status: Normal   Collection Time   07/19/11  9:40 AM      Component Value Range Comment   Total CK 137  7 - 232 (U/L)    CK, MB 3.4  0.3 - 4.0 (ng/mL)    Troponin I <0.30  <0.30 (ng/mL)    Relative Index 2.5  0.0 -  2.5      Dg Chest 2 View  07/18/2011  *RADIOLOGY REPORT*  Clinical Data: Right-sided chest pain for 1 week.  Shortness of breath, hypertension, pleurisy.  CHEST - 2 VIEW  Comparison: 01/18/2008  Findings: The heart is enlarged.  There is prominence of interstitial markings, consistent with interstitial  edema.  There are no focal consolidations.  No overt alveolar edema. Degenerative changes are seen in the spine.  IMPRESSION: Marked cardiomegaly and mild interstitial edema.  Original Report Authenticated By: Patterson Hammersmith, M.D.    Review of Systems  Constitutional: Negative for fever, chills and weight loss.  HENT: Negative for neck pain.   Eyes: Negative for blurred vision, double vision and photophobia.  Respiratory: Positive for cough and shortness of breath. Negative for hemoptysis and sputum production.   Cardiovascular: Positive for chest pain. Negative for palpitations, orthopnea and claudication.  Gastrointestinal: Negative for nausea, vomiting and abdominal pain.  Genitourinary: Negative for dysuria and urgency.  Neurological: Negative for dizziness and headaches.   Blood pressure 130/84, pulse 80, temperature 99.3 F (37.4 C), temperature source Oral, resp. rate 20, height 6' (1.829 m), weight 138.891 kg (306 lb 3.2 oz), SpO2 97.00%. Physical Exam  Constitutional: He is oriented to person, place, and time. He appears well-developed and well-nourished. No distress.  HENT:  Head: Normocephalic and atraumatic.  Eyes: Conjunctivae are normal. Pupils are equal, round, and reactive to light. Left eye exhibits no discharge. No scleral icterus.  Neck: Normal range of motion. Neck supple. No JVD present. No tracheal deviation present. No thyromegaly present.  Cardiovascular: Normal rate and regular rhythm.        Soft systolic and diastolic murmur at left lower sternal border and faint S3 gallop noted  Respiratory: Effort normal and breath sounds normal. No respiratory distress. He has no wheezes. He has no rales.  GI: Soft. Bowel sounds are normal. There is no tenderness. There is no rebound.  Musculoskeletal:       No clubbing cyanosis trace edema noted  Lymphadenopathy:    He has no cervical adenopathy.  Neurological: He is alert and oriented to person, place, and time.     Assessment/Plan: Status post atypical chest pain MI ruled out Resolving decompensated systolic heart failure rule out ischemia Hypertensive heart disease with systolic dysfunction Morbid obesity Probable obstructive sleep apnea/obesity hypoventilation syndrome Elevated blood sugar rule out diabetes mellitus Hypokalemia Plan DC IV Lasix changed to by mouth Aldactone per orders  Agree with beta blockers and ACE inhibitor use Check lipid panel Schedule for nuclear stress test today protocol resting tomorrow Consider dietitian consult Discussed with patient regarding diet and weight loss and strict monitoring of weight blood pressure and compliance with medication etc.  Robynn Pane 07/19/2011, 2:51 PM

## 2011-07-19 NOTE — Progress Notes (Signed)
Subjective: Patient feels slightly improved with diuresis.  Reports breathing is improved from yesterday.  Objective: Vital signs in last 24 hours: Filed Vitals:   07/19/11 0045 07/19/11 0100 07/19/11 0149 07/19/11 0459  BP: 142/78 152/78 161/97 150/94  Pulse: 90  90 78  Temp:   98.7 F (37.1 C) 98 F (36.7 C)  TempSrc:   Oral Oral  Resp: 27 22 20 20   Height:   6' (1.829 m)   Weight:   138.891 kg (306 lb 3.2 oz)   SpO2:   90% 97%   Weight change:   Intake/Output Summary (Last 24 hours) at 07/19/11 1114 Last data filed at 07/19/11 0653  Gross per 24 hour  Intake    400 ml  Output   2525 ml  Net  -2125 ml    Physical Exam: General: Awake, Oriented, No acute distress. HEENT: EOMI. Neck: Supple CV: S1 and S2 Lungs: Clear to ascultation bilaterally Abdomen: Soft, Nontender, Nondistended, +bowel sounds. Ext: Good pulses. 1+ LE edema.  Lab Results:  [facility] 07/19/11 0217 07/18/11 1938  NA 137 141  K 3.0* 2.9*  CL 99 103  CO2 28 28  GLUCOSE 145* 125*  BUN 13 13  CREATININE 1.04 0.95  CALCIUM 9.0 9.2  MG 1.9 2.0  PHOS -- --    Basename 07/18/11 1938  AST 26  ALT 24  ALKPHOS 79  BILITOT 0.7  PROT 7.3  ALBUMIN 4.0   No results found for this basename: LIPASE:2,AMYLASE:2 in the last 72 hours  Basename 07/18/11 1938 07/18/11 1900  WBC 5.0 5.3  NEUTROABS 2.9 --  HGB 13.5 13.6  HCT 40.2 39.6  MCV 82.4 82.5  PLT 104* 103*    Basename 07/19/11 0940 07/19/11 0217  CKTOTAL 137 148  CKMB 3.4 3.1  CKMBINDEX -- --  TROPONINI <0.30 0.31*   No components found with this basename: POCBNP:3  Basename 07/18/11 1938  DDIMER 0.22   No results found for this basename: HGBA1C:2 in the last 72 hours No results found for this basename: CHOL:2,HDL:2,LDLCALC:2,TRIG:2,CHOLHDL:2,LDLDIRECT:2 in the last 72 hours No results found for this basename: TSH,T4TOTAL,FREET3,T3FREE,THYROIDAB in the last 72 hours No results found for this basename:  VITAMINB12:2,FOLATE:2,FERRITIN:2,TIBC:2,IRON:2,RETICCTPCT:2 in the last 72 hours  Micro Results: No results found for this or any previous visit (from the past 240 hour(s)).  Studies/Results: Dg Chest 2 View  07/18/2011  *RADIOLOGY REPORT*  Clinical Data: Right-sided chest pain for 1 week.  Shortness of breath, hypertension, pleurisy.  CHEST - 2 VIEW  Comparison: 01/18/2008  Findings: The heart is enlarged.  There is prominence of interstitial markings, consistent with interstitial edema.  There are no focal consolidations.  No overt alveolar edema. Degenerative changes are seen in the spine.  IMPRESSION: Marked cardiomegaly and mild interstitial edema.  Original Report Authenticated By: Patterson Hammersmith, M.D.    Medications: I have reviewed the patient's current medications. Scheduled Meds:   . aspirin  324 mg Oral Once  . aspirin EC  325 mg Oral Daily  . carvedilol  12.5 mg Oral Q12H  . enoxaparin  40 mg Subcutaneous Q24H  . furosemide  20 mg Intravenous Q8H  . furosemide  40 mg Intravenous Once  . lisinopril  10 mg Oral Daily  . nitroGLYCERIN  1 inch Topical Once  . potassium chloride  10 mEq Intravenous Once  . potassium chloride  10 mEq Intravenous Q1 Hr x 6  . potassium chloride  20 mEq Oral Once  . potassium chloride  40  mEq Oral BID  . sodium chloride  3 mL Intravenous Q12H  . DISCONTD: carvedilol  6.25 mg Oral Q12H   Continuous Infusions:  PRN Meds:.sodium chloride, acetaminophen, ondansetron (ZOFRAN) IV, sodium chloride  Assessment/Plan: Presumed new diagnosis of heart failure, unclear if systolic or diastolic Suspect patient's obesity, long-standing hypertension and possible underlying sleep apnea may be causing cor pulmonale.  Workup pending.  2-D echocardiogram pending.  HIV pending.  ANA pending.  Continue diuresis on IV Lasix 20 mg every 8 hours x3 doses.  Start the patient on carvedilol and lisinopril.  D-dimer normal at the time of admission.  Initial troponin only  mildly elevated, subsequent troponin negative.  TSH normal at 1.853.  Continue aspirin.  Appreciate Dr. Sharyn Lull, cardiology, input.  Uncontrolled hypertension Started on carvedilol and lisinopril.  Further titration depending on patient's blood pressures.  Hyperglycemia Hemoglobin A1c pending.  Suspect patient may have underlying diabetes.  Morbid obesity Discussed diet and exercise.  Will request dietary consultation for diet education.  Suspected sleep apnea Will arrange for outpatient sleep study.  Hypokalemia Replace as needed.  Patient has had history of requiring potassium replacements the past.  Thrombocytopenia Etiology unclear.  Ultrasound of abdomen pending to evaluate for hepatic/splenic congestion.  Prophylaxis Lovenox  CODE STATUS Full code  Disposition Pending   LOS: 1 day  Daron Breeding A, MD 07/19/2011, 11:14 AM

## 2011-07-19 NOTE — ED Notes (Signed)
Attempted to call report. RN unable to take report at this time. Will call back.  

## 2011-07-19 NOTE — Progress Notes (Signed)
[name] spoke with pt concerning CM consult for HF home screen. Per pt choice [facility] to provide [facility] services. Pt eligible for indigent funds. Heart Failure Medication Assistance program application placed on shadow chart for MD. Pt given information concerning [name], [facility], and [facility] generic drug list.    Dave Arnold [phone]

## 2011-07-19 NOTE — Progress Notes (Signed)
*  PRELIMINARY RESULTS* Echocardiogram 2D Echocardiogram has been performed.  Dave Arnold 07/19/2011, 9:44 AM

## 2011-07-20 ENCOUNTER — Inpatient Hospital Stay (HOSPITAL_COMMUNITY): Payer: Self-pay

## 2011-07-20 LAB — CBC
Hemoglobin: 12.5 g/dL — ABNORMAL LOW (ref 13.0–17.0)
MCHC: 32.9 g/dL (ref 30.0–36.0)
Platelets: 98 10*3/uL — ABNORMAL LOW (ref 150–400)
RDW: 13.9 % (ref 11.5–15.5)

## 2011-07-20 LAB — LIPID PANEL
HDL: 20 mg/dL — ABNORMAL LOW (ref >39)
LDL Cholesterol: 98 mg/dL (ref 0–99)
VLDL: 24 mg/dL (ref 0–40)

## 2011-07-20 LAB — BASIC METABOLIC PANEL
BUN: 16 mg/dL (ref 6–23)
CO2: 28 mEq/L (ref 19–32)
GFR calc non Af Amer: >90 mL/min (ref >90)
Glucose, Bld: 103 mg/dL — ABNORMAL HIGH (ref 70–99)
Potassium: 3.6 mEq/L (ref 3.5–5.1)

## 2011-07-20 LAB — PRO B NATRIURETIC PEPTIDE: Pro B Natriuretic peptide (BNP): 692.1 pg/mL — ABNORMAL HIGH (ref 0–125)

## 2011-07-20 MED ORDER — SPIRONOLACTONE 50 MG PO TABS
50.0000 mg | ORAL_TABLET | Freq: Two times a day (BID) | ORAL | Status: DC
  Administered 2011-07-20 – 2011-07-21 (×2): 50 mg via ORAL
  Filled 2011-07-20 (×5): qty 1

## 2011-07-20 NOTE — Progress Notes (Signed)
Chart reviewed.  Pt with BMI 42.5 Meets criteria for extreme obesity.  Weight gain over the last 5 years.  Eats out often and other processed foods.  Increased amount of caffeine containing soda.    Instructed pt on low sodium, weight loss diet.  Nutrition dx:  Nutrition-related knowledge deficit r/t diet therapy AEB MD/nursing request  Intervention:  Brief education;  Provided.  Goals of nutrition therapy discussed.  Understanding confirmed.  RD contact information provided.  Monitoring:  Knowledge; for questions.  Please consult RD if new questions present.  Pager:  [phone] Jeoffrey Massed

## 2011-07-20 NOTE — Progress Notes (Signed)
Subjective:  Patient denies any chest pain states breathing is improved  Objective:  Vital Signs in the last 24 hours: Temp:  [98 F (36.7 C)-99.3 F (37.4 C)] 98 F (36.7 C) (04/21 0500) Pulse Rate:  [72-92] 72  (04/21 0500) Resp:  [19-20] 19  (04/21 0500) BP: (124-154)/(78-100) 145/87 mmHg (04/21 0905) SpO2:  [95 %-98 %] 95 % (04/21 0500) Weight:  [142.52 kg (314 lb 3.2 oz)] 142.52 kg (314 lb 3.2 oz) (04/21 0500)  Intake/Output from previous day: 04/20 0701 - 04/21 0700 In: 920 [P.O.:720; IV Piggyback:200] Out: 720 [Urine:720] Intake/Output from this shift:    Physical Exam: Neck supple no JVD or bruit  Lungs clear to auscultation without rhonchi or rales Cardiovascular exam normal rate and rhythm soft systolic murmur and diastolic murmur noted and faint S3 gallop noted Extremities there is no clubbing cyanosis and trace edema noted   Basename 07/20/11 0508 07/18/11 1938  WBC 5.9 5.0  HGB 12.5* 13.5  PLT 98* 104*    Basename 07/20/11 0508 07/19/11 0217  NA 141 137  K 3.6 3.0*  CL 105 99  CO2 28 28  GLUCOSE 103* 145*  BUN 16 13  CREATININE 0.96 1.04    Basename 07/19/11 1752 07/19/11 0940  TROPONINI <0.30 <0.30   Hepatic Function Panel  Basename 07/18/11 1938  PROT 7.3  ALBUMIN 4.0  AST 26  ALT 24  ALKPHOS 79  BILITOT 0.7  BILIDIR --  IBILI --   No results found for this basename: CHOL in the last 72 hours No results found for this basename: PROTIME in the last 72 hours  Imaging: Imaging results have been reviewed and Dg Chest 2 View  07/18/2011  *RADIOLOGY REPORT*  Clinical Data: Right-sided chest pain for 1 week.  Shortness of breath, hypertension, pleurisy.  CHEST - 2 VIEW  Comparison: 01/18/2008  Findings: The heart is enlarged.  There is prominence of interstitial markings, consistent with interstitial edema.  There are no focal consolidations.  No overt alveolar edema. Degenerative changes are seen in the spine.  IMPRESSION: Marked cardiomegaly  and mild interstitial edema.  Original Report Authenticated By: Patterson Hammersmith, M.D.    Cardiac Studies:  Assessment/Plan:  Status post atypical chest pain MI ruled out  Resolving decompensated systolic heart failure rule out ischemia  Hypertensive heart disease with systolic dysfunction  Morbid obesity  Probable obstructive sleep apnea/obesity hypoventilation syndrome  Elevated blood sugar rule out diabetes mellitus  Hypokalemia Plan Increase Aldactone per orders Scheduled for nuclear stress test today   LOS: 2 days    Dave Arnold N 07/20/2011, 9:59 AM
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
## 2011-07-21 ENCOUNTER — Inpatient Hospital Stay (HOSPITAL_COMMUNITY): Payer: Self-pay

## 2011-07-21 ENCOUNTER — Ambulatory Visit (HOSPITAL_COMMUNITY): Payer: Self-pay

## 2011-07-21 LAB — BASIC METABOLIC PANEL
Calcium: 9.3 mg/dL (ref 8.4–10.5)
GFR calc Af Amer: >90 mL/min (ref >90)
GFR calc non Af Amer: >90 mL/min (ref >90)
Glucose, Bld: 114 mg/dL — ABNORMAL HIGH (ref 70–99)
Potassium: 3.5 mEq/L (ref 3.5–5.1)
Sodium: 142 mEq/L (ref 135–145)

## 2011-07-21 LAB — CBC
Hemoglobin: 13 g/dL (ref 13.0–17.0)
Platelets: 132 10*3/uL — ABNORMAL LOW (ref 150–400)
RBC: 4.76 MIL/uL (ref 4.22–5.81)
WBC: 6.8 10*3/uL (ref 4.0–10.5)

## 2011-07-21 LAB — ANA: Anti Nuclear Antibody(ANA): NEGATIVE

## 2011-07-21 MED ORDER — REGADENOSON 0.4 MG/5ML IV SOLN
0.4000 mg | Freq: Once | INTRAVENOUS | Status: DC
  Filled 2011-07-21: qty 5

## 2011-07-21 MED ORDER — ASPIRIN 325 MG PO TBEC: 325.0000 mg | DELAYED_RELEASE_TABLET | Freq: Every day | ORAL | Status: AC

## 2011-07-21 MED ORDER — REGADENOSON 0.4 MG/5ML IV SOLN: 0.4000 mg | Freq: Once | INTRAVENOUS | Status: DC

## 2011-07-21 MED ORDER — TECHNETIUM TC 99M TETROFOSMIN IV KIT
30.0000 | PACK | Freq: Once | INTRAVENOUS | Status: AC | PRN
  Administered 2011-07-21: 30 via INTRAVENOUS

## 2011-07-21 MED ORDER — TECHNETIUM TC 99M TETROFOSMIN IV KIT
30.0000 | PACK | Freq: Once | INTRAVENOUS | Status: AC | PRN
  Administered 2011-07-20: 30 via INTRAVENOUS

## 2011-07-21 MED ORDER — CARVEDILOL 12.5 MG PO TABS: 12.5000 mg | ORAL_TABLET | Freq: Two times a day (BID) | ORAL | Status: DC

## 2011-07-21 MED ORDER — REGADENOSON 0.4 MG/5ML IV SOLN
0.4000 mg | Freq: Once | INTRAVENOUS | Status: AC
  Administered 2011-07-21: 0.4 mg via INTRAVENOUS

## 2011-07-21 MED ORDER — SPIRONOLACTONE 50 MG PO TABS: 50.0000 mg | ORAL_TABLET | Freq: Two times a day (BID) | ORAL | Status: DC

## 2011-07-21 MED ORDER — LISINOPRIL 10 MG PO TABS: 10.0000 mg | ORAL_TABLET | Freq: Every day | ORAL | Status: DC

## 2011-07-21 NOTE — Progress Notes (Signed)
Subjective: Patient breathing better.  Wondering if he can be discharged home today.  Objective: Vital signs in last 24 hours: Filed Vitals:   07/20/11 0905 07/20/11 1422 07/20/11 2154 07/21/11 0555  BP: 145/87 137/87 149/96 129/84  Pulse:  78 81 66  Temp:  98.1 F (36.7 C) 97.6 F (36.4 C) 97.4 F (36.3 C)  TempSrc:  Oral Oral Oral  Resp:  20 20 20   Height:      Weight:    143.019 kg (315 lb 4.8 oz)  SpO2:  95% 94% 95%   Weight change: 0.499 kg (1 lb 1.6 oz)  Intake/Output Summary (Last 24 hours) at 07/21/11 1610 Last data filed at 07/20/11 2145  Gross per 24 hour  Intake   1200 ml  Output    400 ml  Net    800 ml    Physical Exam: General: Awake, Oriented, No acute distress. HEENT: EOMI. Neck: Supple CV: S1 and S2 Lungs: Clear to ascultation bilaterally Abdomen: Soft, Nontender, Nondistended, +bowel sounds. Ext: Good pulses. 1+ LE edema.  Lab Results:  Basename 07/21/11 0430 07/20/11 0508 07/19/11 0217 07/18/11 1938  NA 142 141 -- --  K 3.5 3.6 -- --  CL 105 105 -- --  CO2 27 28 -- --  GLUCOSE 114* 103* -- --  BUN 20 16 -- --  CREATININE 0.97 0.96 -- --  CALCIUM 9.3 9.1 -- --  MG -- -- 1.9 2.0  PHOS -- -- -- --    Basename 07/18/11 1938  AST 26  ALT 24  ALKPHOS 79  BILITOT 0.7  PROT 7.3  ALBUMIN 4.0   No results found for this basename: LIPASE:2,AMYLASE:2 in the last 72 hours  Basename 07/21/11 0430 07/20/11 0508 07/18/11 1938  WBC 6.8 5.9 --  NEUTROABS -- -- 2.9  HGB 13.0 12.5* --  HCT 39.5 38.0* --  MCV 83.0 84.4 --  PLT 132* 98* --    Basename 07/19/11 1752 07/19/11 0940 07/19/11 0217  CKTOTAL 114 137 148  CKMB 3.3 3.4 3.1  CKMBINDEX -- -- --  TROPONINI <0.30 <0.30 0.31*   No components found with this basename: POCBNP:3  Basename 07/18/11 1938  DDIMER 0.22    Basename 07/19/11 0217  HGBA1C 5.5    Basename 07/20/11 0508  CHOL 142  HDL 20*  LDLCALC 98  TRIG 960  CHOLHDL 7.1  LDLDIRECT --    Basename 07/19/11 0217    TSH 1.853  T4TOTAL --  T3FREE --  THYROIDAB --    Basename 07/19/11 0217  VITAMINB12 --  FOLATE --  FERRITIN 107  TIBC 282  IRON 36*  RETICCTPCT --    Micro Results: No results found for this or any previous visit (from the past 240 hour(s)).  Studies/Results: US Abdomen Complete  07/20/2011  *RADIOLOGY REPORT*  Clinical Data:  Abdominal distention.  Thrombocytopenia.  Evaluate liver/spleen.  Question ascites.  History of obesity. Hypertension.  Heart failure.  COMPLETE ABDOMINAL ULTRASOUND  Comparison:  Acute abdomen series of 01/18/2008  Findings:  Gallbladder:  Gallstones, measuring up to 1.6 cm.  No wall thickening or pericholecystic fluid. Sonographic Murphy's sign was not elicited.  Common bile duct: Partially obscured by bowel gas.  Upper normal, 6 mm.  Liver: Increasing echogenicity.  No focal lesion.  IVC: Negative  Pancreas:  Poorly visualized due to overlying bowel gas.  Spleen:  Upper normal in size.  13.1 cm maximal cranial caudal dimension.  Right Kidney:  15.6 cm. No hydronephrosis.  Left Kidney:  15.9 cm. No hydronephrosis.  Abdominal aorta:  Partially obscured distally.  No proximal aneurysm.  No ascites.  Exam degraded by patient body habitus and overlying bowel gas.  IMPRESSION:  1.  Degraded exam secondary to patient body habitus and overlying bowel gas. 2.  Cholelithiasis without cholecystitis. 3.  Hepatic steatosis. 4.  Splenic size upper normal. 5.  No ascites.  Original Report Authenticated By: Consuello Bossier, M.D.    Medications: I have reviewed the patient's current medications. Scheduled Meds:    . aspirin EC  325 mg Oral Daily  . carvedilol  12.5 mg Oral Q12H  . enoxaparin  40 mg Subcutaneous Q24H  . lisinopril  10 mg Oral Daily  . sodium chloride  3 mL Intravenous Q12H  . spironolactone  50 mg Oral BID  . DISCONTD: spironolactone  25 mg Oral BID   Continuous Infusions:  PRN Meds:.sodium chloride, acetaminophen, ondansetron (ZOFRAN) IV, sodium  chloride  Assessment/Plan: Acute systolic congestive heart failure 2-D echocardiogram on 07/19/2011 showed cavity size was mildly dilated, systolic function was moderately to severely reduced, ejection fraction 30-35%, aortic valve showed mild regurgitation, left atrium dilated, tricuspid valve showed mild regurgitation.  HIV nonreactive.  ANA negative.  Initially diuresed on furosemide which has been transitioned to spironolactone twice a day.  Continue carvedilol and lisinopril.  D-dimer normal at the time of admission.  Initial troponin only mildly elevated, troponin negative x3.  TSH normal at 1.853.  Continue aspirin.  Myocardial perfusion scan on April 22 of 2013 showed no evidence for inducible ischemia.  Appreciate Dr. Sharyn Lull, cardiology, input.  Discharge the patient today.  Uncontrolled hypertension Improved.  Continue carvedilol, lisinopril, spironolactone.  Further titration depending on patient's blood pressures.  Hyperglycemia Hemoglobin A1c 5.5.   Morbid obesity Discussed diet and exercise.  Appreciate dietary input in patient education.  Suspected sleep apnea Will arrange for outpatient sleep study.  Hypokalemia Resolved with replacement.  Patient has had history of requiring potassium replacements in the past.  Thrombocytopenia Etiology unclear.  Ultrasound of abdomen pending to evaluate for hepatic/splenic congestion.  Further workup/management as outpatient.  Prophylaxis Lovenox  CODE STATUS Full code  Disposition Discharge the patient today.   LOS: 3 days  Gunda Maqueda A, MD 07/21/2011, 9:23 AM

## 2011-07-21 NOTE — Progress Notes (Signed)
   CARE MANAGEMENT NOTE 07/21/2011  Patient:  Dave Arnold, Dave Arnold   Account Number:  [id number]  Date Initiated:  07/21/2011  Documentation initiated by:  Jiles Crocker  Subjective/Objective Assessment:   ADMITTED WITH CHF, CHEST PAIN, CARDIOMYOPATHY     Action/Plan:   NO PCP; INDEPENDENT PRIOR TO ADMISSION   Anticipated DC Date:  07/28/2011   Anticipated DC Plan:  HOME/SELF CARE           Status of service:  In process, will continue to follow Medicare Important Message given?  NA - LOS <3 / Initial given by admissions (If response is "NO", the following Medicare IM given date fields will be blank)  Discharge Disposition:  HOME/SELF CARE  Per UR Regulation:  Reviewed for med. necessity/level of care/duration of stay  Comments:  07/21/2011- PATIENT WAS SEEN BY THE WEEKEND CM- INFORMATION GIVEN TO PATIENT FOR THE [facility]; PATIENT IS FOR OUTPATIENT STRESS TEST- CLINICAL INFORMATION FAXED AS REQUESTED; OUTPATIENT DEPT TO CONTACT CM WITH DATE OR FOR FURTHER INFORMATION; CURRENTLY RECEIVING 2ND PART OF HIS STRESS TEST; B Kiona Blume RN, BSN, MHA

## 2011-07-21 NOTE — Progress Notes (Signed)
Subjective:  Patient denies any chest pain or shortness of breath states feels better  Objective:  Vital Signs in the last 24 hours: Temp:  [97.4 F (36.3 C)-98.5 F (36.9 C)] 98.5 F (36.9 C) (04/22 0900) Pulse Rate:  [66-92] 88  (04/22 1153) Resp:  [19-20] 19  (04/22 0900) BP: (126-168)/(51-100) 158/99 mmHg (04/22 1153) SpO2:  [94 %-98 %] 98 % (04/22 0900) Weight:  [143.019 kg (315 lb 4.8 oz)] 143.019 kg (315 lb 4.8 oz) (04/22 0555)  Intake/Output from previous day: 04/21 0701 - 04/22 0700 In: 1200 [P.O.:960; I.V.:240] Out: 400 [Urine:400] Intake/Output from this shift:    Physical Exam: Neck: no adenopathy, no carotid bruit, no JVD and supple, symmetrical, trachea midline Lungs: clear to auscultation bilaterally Heart: regular rate and rhythm, S1, S2 normal and Soft systolic and diastolic murmur noted Abdomen: soft, non-tender; bowel sounds normal; no masses,  no organomegaly Extremities: extremities normal, atraumatic, no cyanosis or edema  Lab Results:  Basename 07/21/11 0430 07/20/11 0508  WBC 6.8 5.9  HGB 13.0 12.5*  PLT 132* 98*    Basename 07/21/11 0430 07/20/11 0508  NA 142 141  K 3.5 3.6  CL 105 105  CO2 27 28  GLUCOSE 114* 103*  BUN 20 16  CREATININE 0.97 0.96    Basename 07/19/11 1752 07/19/11 0940  TROPONINI <0.30 <0.30   Hepatic Function Panel  Basename 07/18/11 1938  PROT 7.3  ALBUMIN 4.0  AST 26  ALT 24  ALKPHOS 79  BILITOT 0.7  BILIDIR --  IBILI --    Basename 07/20/11 0508  CHOL 142   No results found for this basename: PROTIME in the last 72 hours  Imaging: Imaging results have been reviewed and US Abdomen Complete  07/20/2011  *RADIOLOGY REPORT*  Clinical Data:  Abdominal distention.  Thrombocytopenia.  Evaluate liver/spleen.  Question ascites.  History of obesity. Hypertension.  Heart failure.  COMPLETE ABDOMINAL ULTRASOUND  Comparison:  Acute abdomen series of 01/18/2008  Findings:  Gallbladder:  Gallstones, measuring up to  1.6 cm.  No wall thickening or pericholecystic fluid. Sonographic Murphy's sign was not elicited.  Common bile duct: Partially obscured by bowel gas.  Upper normal, 6 mm.  Liver: Increasing echogenicity.  No focal lesion.  IVC: Negative  Pancreas:  Poorly visualized due to overlying bowel gas.  Spleen:  Upper normal in size.  13.1 cm maximal cranial caudal dimension.  Right Kidney:  15.6 cm. No hydronephrosis.  Left Kidney:  15.9 cm. No hydronephrosis.  Abdominal aorta:  Partially obscured distally.  No proximal aneurysm.  No ascites.  Exam degraded by patient body habitus and overlying bowel gas.  IMPRESSION:  1.  Degraded exam secondary to patient body habitus and overlying bowel gas. 2.  Cholelithiasis without cholecystitis. 3.  Hepatic steatosis. 4.  Splenic size upper normal. 5.  No ascites.  Original Report Authenticated By: Consuello Bossier, M.D.    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

## 2011-07-21 NOTE — Progress Notes (Signed)
Clinical information faxed to Outpatient [facility] Study for an apt. Patient completing the second half of the stress test; Information left in room for medication assistance program and the [facility]; Outpatient sleep study will call CM with a date and time for the test. B Ave Filter RN, BSN, [place]

## 2011-07-21 NOTE — Discharge Summary (Addendum)
Discharge Summary  Dave Arnold MR#: [id number]  DOB:1970-09-25  Date of Admission: 07/18/2011 Date of Discharge: 07/21/2011  Patient's PCP: No primary provider on file. Patient was given resources to followup with [facility].  Attending Physician:Kaysea Raya A  Consults: Dr. Sharyn Lull, Cardiology  Discharge Diagnoses: Principal Problem:  *Acute systolic heart failure Active Problems:  Obesity  Hypertension  Thrombocytopenia  Brief Admitting History and Physical Dave Arnold is a 41 y/o with h/o obesity, HTN, now presents with new massive cardiomegaly who presented on 07/18/2011 with dyspnea on exertion.   Discharge Medications Medication List  As of 07/21/2011  3:28 PM   STOP taking these medications         ibuprofen 400 MG tablet         TAKE these medications         aspirin 325 MG EC tablet   Take 1 tablet (325 mg total) by mouth daily.      carvedilol 12.5 MG tablet   Commonly known as: COREG   Take 1 tablet (12.5 mg total) by mouth every 12 (twelve) hours.      dextromethorphan-guaiFENesin 30-600 MG per 12 hr tablet   Commonly known as: MUCINEX DM   Take 1 tablet by mouth every 12 (twelve) hours.      diphenhydrAMINE 25 MG tablet   Commonly known as: BENADRYL   Take 25 mg by mouth every 6 (six) hours as needed. For seasonal allergies      lisinopril 10 MG tablet   Commonly known as: PRINIVIL,ZESTRIL   Take 1 tablet (10 mg total) by mouth daily.      spironolactone 50 MG tablet   Commonly known as: ALDACTONE   Take 1 tablet (50 mg total) by mouth 2 (two) times daily.            Hospital Course: Acute systolic congestive heart failure  Initially diuresed on furosemide which has been transitioned to spironolactone twice a day by Dr. Sharyn Lull. 2-D echocardiogram on 07/19/2011 showed cavity size was mildly dilated, systolic function was moderately to severely reduced, ejection fraction 30-35%, aortic valve showed mild regurgitation, left  atrium dilated, tricuspid valve showed mild regurgitation. HIV nonreactive. ANA negative. Patient was started on carvedilol and lisinopril and titrated up as tolerated with improvement in blood pressure. D-dimer normal at the time of admission. Initial troponin only mildly elevated, troponin negative x3. TSH normal at 1.853. Continue aspirin at discharge. Myocardial perfusion scan on April 22 of 2013 showed no evidence for inducible ischemia.  Patient to followup with Dr. Sharyn Lull, cardiology, as outpatient for further care and management.   Uncontrolled hypertension  Improved. Continue carvedilol, lisinopril, spironolactone. Further titration depending on patient's blood pressures as outpatient.   Hyperglycemia  Hemoglobin A1c 5.5.  Not suggestive of diabetes.  Morbid obesity  Discussed diet and exercise.  Dietary met with the patient and discussed appropriate diet options for the patient.    Suspected sleep apnea  Will arrange for outpatient sleep study.   Hypokalemia  Resolved with replacement. Patient has had history of requiring potassium replacements in the past.   Thrombocytopenia  Etiology unclear. Ultrasound of abdomen pending to evaluate for hepatic/splenic congestion. Further workup/management as outpatient.   Day of Discharge BP 158/99  Pulse 88  Temp(Src) 98.5 F (36.9 C) (Oral)  Resp 19  Ht 6' (1.829 m)  Wt 143.019 kg (315 lb 4.8 oz)  BMI 42.76 kg/m2  SpO2 98%  Results for orders placed during the hospital encounter of 07/18/11 (  from the past 48 hour(s))  CARDIAC PANEL(CRET KIN+CKTOT+MB+TROPI)     Status: Abnormal   Collection Time   07/19/11  5:52 PM      Component Value Range Comment   Total CK 114  7 - 232 (U/L)    CK, MB 3.3  0.3 - 4.0 (ng/mL)    Troponin I <0.30  <0.30 (ng/mL)    Relative Index 2.9 (*) 0.0 - 2.5    BASIC METABOLIC PANEL     Status: Abnormal   Collection Time   07/20/11  5:08 AM      Component Value Range Comment   Sodium 141  135 - 145  (mEq/L)    Potassium 3.6  3.5 - 5.1 (mEq/L)    Chloride 105  96 - 112 (mEq/L)    CO2 28  19 - 32 (mEq/L)    Glucose, Bld 103 (*) 70 - 99 (mg/dL)    BUN 16  6 - 23 (mg/dL)    Creatinine, Ser 1.61  0.50 - 1.35 (mg/dL)    Calcium 9.1  8.4 - 10.5 (mg/dL)    GFR calc non Af Amer >90  >90 (mL/min)    GFR calc Af Amer >90  >90 (mL/min)   LIPID PANEL     Status: Abnormal   Collection Time   07/20/11  5:08 AM      Component Value Range Comment   Cholesterol 142  0 - 200 (mg/dL)    Triglycerides 096  <150 (mg/dL)    HDL 20 (*) >04 (mg/dL)    Total CHOL/HDL Ratio 7.1      VLDL 24  0 - 40 (mg/dL)    LDL Cholesterol 98  0 - 99 (mg/dL)   PRO B NATRIURETIC PEPTIDE     Status: Abnormal   Collection Time   07/20/11  5:08 AM      Component Value Range Comment   Pro B Natriuretic peptide (BNP) 692.1 (*) 0 - 125 (pg/mL)   CBC     Status: Abnormal   Collection Time   07/20/11  5:08 AM      Component Value Range Comment   WBC 5.9  4.0 - 10.5 (K/uL)    RBC 4.50  4.22 - 5.81 (MIL/uL)    Hemoglobin 12.5 (*) 13.0 - 17.0 (g/dL)    HCT 54.0 (*) 98.1 - 52.0 (%)    MCV 84.4  78.0 - 100.0 (fL)    MCH 27.8  26.0 - 34.0 (pg)    MCHC 32.9  30.0 - 36.0 (g/dL)    RDW 19.1  47.8 - 29.5 (%)    Platelets 98 (*) 150 - 400 (K/uL) CONSISTENT WITH PREVIOUS RESULT  BASIC METABOLIC PANEL     Status: Abnormal   Collection Time   07/21/11  4:30 AM      Component Value Range Comment   Sodium 142  135 - 145 (mEq/L)    Potassium 3.5  3.5 - 5.1 (mEq/L)    Chloride 105  96 - 112 (mEq/L)    CO2 27  19 - 32 (mEq/L)    Glucose, Bld 114 (*) 70 - 99 (mg/dL)    BUN 20  6 - 23 (mg/dL)    Creatinine, Ser 6.21  0.50 - 1.35 (mg/dL)    Calcium 9.3  8.4 - 10.5 (mg/dL)    GFR calc non Af Amer >90  >90 (mL/min)    GFR calc Af Amer >90  >90 (mL/min)   CBC     Status:  Abnormal   Collection Time   07/21/11  4:30 AM      Component Value Range Comment   WBC 6.8  4.0 - 10.5 (K/uL)    RBC 4.76  4.22 - 5.81 (MIL/uL)    Hemoglobin 13.0   13.0 - 17.0 (g/dL)    HCT 62.1  30.8 - 65.7 (%)    MCV 83.0  78.0 - 100.0 (fL)    MCH 27.3  26.0 - 34.0 (pg)    MCHC 32.9  30.0 - 36.0 (g/dL)    RDW 84.6  96.2 - 95.2 (%)    Platelets 132 (*) 150 - 400 (K/uL)     Dg Chest 2 View  07/18/2011  *RADIOLOGY REPORT*  Clinical Data: Right-sided chest pain for 1 week.  Shortness of breath, hypertension, pleurisy.  CHEST - 2 VIEW  Comparison: 01/18/2008  Findings: The heart is enlarged.  There is prominence of interstitial markings, consistent with interstitial edema.  There are no focal consolidations.  No overt alveolar edema. Degenerative changes are seen in the spine.  IMPRESSION: Marked cardiomegaly and mild interstitial edema.  Original Report Authenticated By: Patterson Hammersmith, M.D.   US Abdomen Complete  07/20/2011  *RADIOLOGY REPORT*  Clinical Data:  Abdominal distention.  Thrombocytopenia.  Evaluate liver/spleen.  Question ascites.  History of obesity. Hypertension.  Heart failure.  COMPLETE ABDOMINAL ULTRASOUND  Comparison:  Acute abdomen series of 01/18/2008  Findings:  Gallbladder:  Gallstones, measuring up to 1.6 cm.  No wall thickening or pericholecystic fluid. Sonographic Murphy's sign was not elicited.  Common bile duct: Partially obscured by bowel gas.  Upper normal, 6 mm.  Liver: Increasing echogenicity.  No focal lesion.  IVC: Negative  Pancreas:  Poorly visualized due to overlying bowel gas.  Spleen:  Upper normal in size.  13.1 cm maximal cranial caudal dimension.  Right Kidney:  15.6 cm. No hydronephrosis.  Left Kidney:  15.9 cm. No hydronephrosis.  Abdominal aorta:  Partially obscured distally.  No proximal aneurysm.  No ascites.  Exam degraded by patient body habitus and overlying bowel gas.  IMPRESSION:  1.  Degraded exam secondary to patient body habitus and overlying bowel gas. 2.  Cholelithiasis without cholecystitis. 3.  Hepatic steatosis. 4.  Splenic size upper normal. 5.  No ascites.  Original Report Authenticated By: Consuello Bossier, M.D.   Nm Myocar Multi W/spect W/wall Motion / Ef  07/21/2011  *RADIOLOGY REPORT*  Clinical Data:  Chest pain  MYOCARDIAL IMAGING WITH SPECT (REST AND PHARMACOLOGIC-STRESS) GATED LEFT VENTRICULAR WALL MOTION STUDY LEFT VENTRICULAR EJECTION FRACTION  Technique:  Standard myocardial SPECT imaging was performed after resting intravenous injection of 30 mCi Tc-29m tetrofosmin. Subsequently, intravenous infusion of Lexiscan was performed under the supervision of the Cardiology staff.  At peak effect of the drug, 30 mCi Tc-49m tetrofosmin was injected intravenously and standard myocardial SPECT  imaging was performed.  Quantitative gated imaging was also performed to evaluate left ventricular wall motion, and estimate left ventricular ejection fraction.  Comparison:  None.  Findings: Multiplanar SPECT imaging shows no fixed or reversible perfusion defect within the left ventricle myocardium.  Left ventricular cavity size appears grossly increased.  Images obtained using cardiac gating displayed using a surface rendering algorithm show diffuse hypokinesia.  Septum is akinetic with marked hypokinesia of the distal inferior and inferolateral wall with akinetic lateral apex.  Left ventricular end-diastolic volume is calculated at 339 ml. Left ventricular end systolic volume is calculated at 257 ml.  The derived left ventricular  ejection fraction is 24%.  IMPRESSION: No evidence for inducible ischemia.  Enlarged left ventricle with decreased left ventricular ejection fraction of 24%.  Original Report Authenticated By: ERIC A. MANSELL, M.D.   Disposition: Home, patient to arrange followup with [facility], resources provided by case management.  Diet: Heart healthy diet  Activity: Resume as tolerated   Follow-up Appts: Discharge Orders    Future Orders Please Complete By Expires   Diet - low sodium heart healthy      Increase activity slowly      Heart Failure patients record your daily weight using  the same scale at the same time of day      Discharge instructions      Comments:   Followup with [facility] clinic with results as provided by case manager to establish primary care physician. Followup with Dr. Sharyn Lull (cardiology) in 1 week.   ACE Inhibitor / ARB already ordered         TESTS THAT NEED FOLLOW-UP None  Time spent on discharge, talking to the patient, and coordinating care: 35 mins.   Signed: Cristal Ford, MD 07/21/2011, 3:28 PM

## 2011-08-13 ENCOUNTER — Ambulatory Visit (HOSPITAL_BASED_OUTPATIENT_CLINIC_OR_DEPARTMENT_OTHER): Payer: Self-pay

## 2011-08-27 ENCOUNTER — Emergency Department (HOSPITAL_COMMUNITY)
Admission: EM | Admit: 2011-08-27 | Discharge: 2011-08-27 | Disposition: A | Discharge status: Home / Self Care | Payer: Self-pay | Attending: Emergency Medicine | Admitting: Emergency Medicine

## 2011-08-27 ENCOUNTER — Encounter (HOSPITAL_COMMUNITY): Payer: Self-pay | Admitting: Emergency Medicine

## 2011-08-27 DIAGNOSIS — E669 Obesity, unspecified: Secondary | ICD-10-CM | POA: Insufficient documentation

## 2011-08-27 DIAGNOSIS — I10 Essential (primary) hypertension: Secondary | ICD-10-CM

## 2011-08-27 DIAGNOSIS — I509 Heart failure, unspecified: Secondary | ICD-10-CM | POA: Insufficient documentation

## 2011-08-27 DIAGNOSIS — Z760 Encounter for issue of repeat prescription: Secondary | ICD-10-CM | POA: Insufficient documentation

## 2011-08-27 MED ORDER — LISINOPRIL 20 MG PO TABS: 10.0000 mg | ORAL_TABLET | Freq: Every day | ORAL | Status: DC

## 2011-08-27 MED ORDER — CARVEDILOL 12.5 MG PO TABS: 12.5000 mg | ORAL_TABLET | Freq: Two times a day (BID) | ORAL | Status: DC

## 2011-08-27 MED ORDER — SPIRONOLACTONE 50 MG PO TABS: 50.0000 mg | ORAL_TABLET | Freq: Two times a day (BID) | ORAL | Status: DC

## 2011-08-27 NOTE — ED Provider Notes (Signed)
History   This chart was scribed for Flint Melter, MD by Brooks Sailors. The patient was seen in room STRE4/STRE4. Patient's care was started at 1550.   CSN: [id number]  Arrival date & time 08/27/11  1550   None     Chief Complaint  Patient presents with  . Medication Refill    (Consider location/radiation/quality/duration/timing/severity/associated sxs/prior treatment) HPI  DYON ROTERT is a 41 y.o. male who presents to the Emergency Department needing medications refilled for his heart problems. Pt admitted to [facility] one month ago with diagnosis of acute systolic heart failure, HTN, obesity. Pt says he cant get into a physician's group to be seen.    Past Medical History  Diagnosis Date  . Hypertension   . Obesity   . Heart failure of unknown type 06/2011  . Thrombocytopenia     History reviewed. No pertinent past surgical history.  No family history on file.  History  Substance Use Topics  . Smoking status: Never Smoker   . Smokeless tobacco: Not on file  . Alcohol Use: No      Review of Systems  All other systems reviewed and are negative.    Allergies  Review of patient's allergies indicates no known allergies.  Home Medications   Current Outpatient Rx  Name Route Sig Dispense Refill  . ASPIRIN EC 325 MG PO TBEC Oral Take 325 mg by mouth daily.    Marland Kitchen CARVEDILOL 12.5 MG PO TABS Oral Take 12.5 mg by mouth every 12 (twelve) hours.    Marland Kitchen LISINOPRIL 10 MG PO TABS Oral Take 10 mg by mouth daily.    Marland Kitchen SPIRONOLACTONE 50 MG PO TABS Oral Take 50 mg by mouth 2 (two) times daily.    Marland Kitchen CARVEDILOL 12.5 MG PO TABS Oral Take 1 tablet (12.5 mg total) by mouth 2 (two) times daily with a meal. 180 tablet 0  . LISINOPRIL 20 MG PO TABS Oral Take 0.5 tablets (10 mg total) by mouth daily. 90 tablet 0  . SPIRONOLACTONE 50 MG PO TABS Oral Take 1 tablet (50 mg total) by mouth 2 (two) times daily. 180 tablet 0    BP 169/72  Pulse 65  Temp(Src) 97.4 F (36.3 C) (Oral)   Resp 20  SpO2 97%  Physical Exam  Nursing note and vitals reviewed. Constitutional: He is oriented to person, place, and time. He appears well-developed and well-nourished. No distress.  HENT:  Head: Normocephalic and atraumatic.  Eyes: EOM are normal.  Neck: Neck supple. No tracheal deviation present.  Cardiovascular: Normal rate.   Pulmonary/Chest: No respiratory distress.  Abdominal: He exhibits no distension.  Musculoskeletal: Normal range of motion. He exhibits no edema.  Neurological: He is alert and oriented to person, place, and time. No sensory deficit.  Skin: Skin is warm and dry.  Psychiatric: He has a normal mood and affect. His behavior is normal.    ED Course  Procedures (including critical care time)  Pt seen at 1657 to have prescriptions written here.    Labs Reviewed - No data to display No results found.   1. Hypertension       MDM  Patient here for medication refill.Doubt metabolic instability, serious bacterial infection or impending vascular collapse; the patient is stable for discharge.      I personally performed the services described in this documentation, which was scribed in my presence. The recorded information has been reviewed and considered.     Flint Melter, MD 08/27/11  2131 

## 2011-08-27 NOTE — ED Notes (Signed)
Pt was admitted to [facility] approx 1 month ago with dx of Acute systolic heart failure, HTN, obesity, and decreased platelet count. Pt was given Rx's for same and had them filled.  Pt st's he has tried to see MD that he was referred to but was told that he is not taking new pt's.  Pt st's he feels good just needs Rx's.

## 2011-08-27 NOTE — ED Notes (Signed)
Patient state he was seen at [facility] x 1 month ago and was prescribed Carvedilol 12.5 mg and lisinopril 10 mg and spironolactone 50 mg. Patient was referred to [facility] and they are not taking any new patient until August and he needs his medication [facility]. Patient states he has been talking with Jaynee Eagles with [facility].

## 2011-08-27 NOTE — Discharge Instructions (Signed)
See the Dr. of your choice for continued primary care treatments.   Arterial Hypertension Arterial hypertension (high blood pressure) is a condition of elevated pressure in your blood vessels. Hypertension over a long period of time is a risk factor for strokes, heart attacks, and heart failure. It is also the leading cause of kidney (renal) failure.  CAUSES   In Adults -- Over 90% of all hypertension has no known cause. This is called essential or primary hypertension. In the other 10% of people with hypertension, the increase in blood pressure is caused by another disorder. This is called secondary hypertension. Important causes of secondary hypertension are:   Heavy alcohol use.   Obstructive sleep apnea.   Hyperaldosterosim (Conn's syndrome).   Steroid use.   Chronic kidney failure.   Hyperparathyroidism.   Medications.   Renal artery stenosis.   Pheochromocytoma.   Cushing's disease.   Coarctation of the aorta.   Scleroderma renal crisis.   Licorice (in excessive amounts).   Drugs (cocaine, methamphetamine).  Your caregiver can explain any items above that apply to you.  In Children -- Secondary hypertension is more common and should always be considered.   Pregnancy -- Few women of childbearing age have high blood pressure. However, up to 10% of them develop hypertension of pregnancy. Generally, this will not harm the woman. It may be a sign of 3 complications of pregnancy: preeclampsia, HELLP syndrome, and eclampsia. Follow up and control with medication is necessary.  SYMPTOMS   This condition normally does not produce any noticeable symptoms. It is usually found during a routine exam.   Malignant hypertension is a late problem of high blood pressure. It may have the following symptoms:   Headaches.   Blurred vision.   End-organ damage (this means your kidneys, heart, lungs, and other organs are being damaged).   Stressful situations can increase the blood  pressure. If a person with normal blood pressure has their blood pressure go up while being seen by their caregiver, this is often termed "white coat hypertension." Its importance is not known. It may be related with eventually developing hypertension or complications of hypertension.   Hypertension is often confused with mental tension, stress, and anxiety.  DIAGNOSIS  The diagnosis is made by 3 separate blood pressure measurements. They are taken at least 1 week apart from each other. If there is organ damage from hypertension, the diagnosis may be made without repeat measurements. Hypertension is usually identified by having blood pressure readings:  Above 140/90 mmHg measured in both arms, at 3 separate times, over a couple weeks.   Over 130/80 mmHg should be considered a risk factor and may require treatment in patients with diabetes.  Blood pressure readings over 120/80 mmHg are called "pre-hypertension" even in non-diabetic patients. To get a true blood pressure measurement, use the following guidelines. Be aware of the factors that can alter blood pressure readings.  Take measurements at least 1 hour after caffeine.   Take measurements 30 minutes after smoking and without any stress. This is another reason to quit smoking - it raises your blood pressure.   Use a proper cuff size. Ask your caregiver if you are not sure about your cuff size.   Most home blood pressure cuffs are automatic. They will measure systolic and diastolic pressures. The systolic pressure is the pressure reading at the start of sounds. Diastolic pressure is the pressure at which the sounds disappear. If you are elderly, measure pressures in multiple postures. Try  sitting, lying or standing.   Sit at rest for a minimum of 5 minutes before taking measurements.   You should not be on any medications like decongestants. These are found in many cold medications.   Record your blood pressure readings and review them  with your caregiver.  If you have hypertension:  Your caregiver may do tests to be sure you do not have secondary hypertension (see "causes" above).   Your caregiver may also look for signs of metabolic syndrome. This is also called Syndrome X or Insulin Resistance Syndrome. You may have this syndrome if you have type 2 diabetes, abdominal obesity, and abnormal blood lipids in addition to hypertension.   Your caregiver will take your medical and family history and perform a physical exam.   Diagnostic tests may include blood tests (for glucose, cholesterol, potassium, and kidney function), a urinalysis, or an EKG. Other tests may also be necessary depending on your condition.  PREVENTION  There are important lifestyle issues that you can adopt to reduce your chance of developing hypertension:  Maintain a normal weight.   Limit the amount of salt (sodium) in your diet.   Exercise often.   Limit alcohol intake.   Get enough potassium in your diet. Discuss specific advice with your caregiver.   Follow a DASH diet (dietary approaches to stop hypertension). This diet is rich in fruits, vegetables, and low-fat dairy products, and avoids certain fats.  PROGNOSIS  Essential hypertension cannot be cured. Lifestyle changes and medical treatment can lower blood pressure and reduce complications. The prognosis of secondary hypertension depends on the underlying cause. Many people whose hypertension is controlled with medicine or lifestyle changes can live a normal, healthy life.  RISKS AND COMPLICATIONS  While high blood pressure alone is not an illness, it often requires treatment due to its short- and long-term effects on many organs. Hypertension increases your risk for:  CVAs or strokes (cerebrovascular accident).   Heart failure due to chronically high blood pressure (hypertensive cardiomyopathy).   Heart attack (myocardial infarction).   Damage to the retina (hypertensive retinopathy).     Kidney failure (hypertensive nephropathy).  Your caregiver can explain list items above that apply to you. Treatment of hypertension can significantly reduce the risk of complications. TREATMENT   For overweight patients, weight loss and regular exercise are recommended. Physical fitness lowers blood pressure.   Mild hypertension is usually treated with diet and exercise. A diet rich in fruits and vegetables, fat-free dairy products, and foods low in fat and salt (sodium) can help lower blood pressure. Decreasing salt intake decreases blood pressure in a 1/3 of people.   Stop smoking if you are a smoker.  The steps above are highly effective in reducing blood pressure. While these actions are easy to suggest, they are difficult to achieve. Most patients with moderate or severe hypertension end up requiring medications to bring their blood pressure down to a normal level. There are several classes of medications for treatment. Blood pressure pills (antihypertensives) will lower blood pressure by their different actions. Lowering the blood pressure by 10 mmHg may decrease the risk of complications by as much as 25%. The goal of treatment is effective blood pressure control. This will reduce your risk for complications. Your caregiver will help you determine the best treatment for you according to your lifestyle. What is excellent treatment for one person, may not be for you. HOME CARE INSTRUCTIONS   Do not smoke.   Follow the lifestyle changes outlined  in the "Prevention" section.   If you are on medications, follow the directions carefully. Blood pressure medications must be taken as prescribed. Skipping doses reduces their benefit. It also puts you at risk for problems.   Follow up with your caregiver, as directed.   If you are asked to monitor your blood pressure at home, follow the guidelines in the "Diagnosis" section above.  SEEK MEDICAL CARE IF:   You think you are having medication  side effects.   You have recurrent headaches or lightheadedness.   You have swelling in your ankles.   You have trouble with your vision.  SEEK IMMEDIATE MEDICAL CARE IF:   You have sudden onset of ch   RESOURCE GUIDE  Dental Problems  Patients with Medicaid: [facility] [phone] [address]                                           [phone] [address] Phone:  [phone]                                                  Phone:  [phone]  If unable to pay or uninsured, contact:  [facility] or [facility]. to become qualified for the adult dental clinic.  Chronic Pain Problems Contact [facility]  [phone] Patients need to be referred by their primary care doctor.  Insufficient Money for Medicine Contact [employer]:  call "211" or [facility] [phone].  No Primary Care Doctor Call [facility]  [phone] Other agencies that provide inexpensive medical care    [facility]  [phone]    [facility]  [phone]    [facility]  [phone]    [facility]  [phone]    [facility]  [phone]    [facility]  [phone]  Psychological Services [facility]  [phone] [facility]  [phone] [facility]   [phone] (emergency services [phone])  Substance Abuse Resources [facility]  510-671-9611 [facility] (516)194-8269 [facility] [phone] Floydene Flock [phone] Residential & Outpatient Substance Abuse Program  (225)117-2020  Abuse/Neglect [facility] Child Abuse Hotline 8186985940 [facility] Child Abuse Hotline (907)268-6336 (After Hours)  Emergency Shelter [facility] 616-554-7496  [facility] [place] [facility] [place] 425-752-7844 [facility] [phone]  MRSA Hotline #:    (562)589-6123    [facility] Resources  [facility]     [employer]                          [facility] Dept. [address] [place]                       [address]      [address]  [address]  Cristobal Goldmann Phone:  [phone]                                   Phone:  [phone]                 Phone:  [phone]  [facility] Phone:  [phone]  [facility] Child Abuse Hotline 205-346-6730 (832)626-7648 (After Hours)  est pain or pressure, difficulty breathing, or other symptoms of a heart attack.   You have a severe headache.   You have symptoms of a stroke (such as sudden weakness, difficulty speaking, difficulty walking).  MAKE SURE YOU:   Understand these instructions.   Will watch your condition.   Will get help right away if you are not doing well or get worse.  Document Released: 03/17/2005 Document Revised: 03/06/2011 Document Reviewed: 10/15/2006 [facility] Patient Information 2012 [place], [place].

## 2011-08-27 NOTE — ED Notes (Signed)
Discharge instructions reviewed with pt; verbalizes understanding.  No questions asked; no further c/o's voiced.  Pt ambulatory to lobby.  NAD noted. 

## 2012-10-23 ENCOUNTER — Encounter (HOSPITAL_COMMUNITY): Payer: Self-pay | Admitting: Emergency Medicine

## 2012-10-23 ENCOUNTER — Emergency Department (INDEPENDENT_AMBULATORY_CARE_PROVIDER_SITE_OTHER): Payer: 59

## 2012-10-23 ENCOUNTER — Emergency Department (INDEPENDENT_AMBULATORY_CARE_PROVIDER_SITE_OTHER)
Admission: EM | Admit: 2012-10-23 | Discharge: 2012-10-23 | Disposition: A | Discharge status: Home / Self Care | Payer: Self-pay | Source: Home / Self Care | Attending: Emergency Medicine | Admitting: Emergency Medicine

## 2012-10-23 DIAGNOSIS — I509 Heart failure, unspecified: Secondary | ICD-10-CM

## 2012-10-23 DIAGNOSIS — E119 Type 2 diabetes mellitus without complications: Secondary | ICD-10-CM

## 2012-10-23 DIAGNOSIS — I5022 Chronic systolic (congestive) heart failure: Secondary | ICD-10-CM

## 2012-10-23 DIAGNOSIS — I10 Essential (primary) hypertension: Secondary | ICD-10-CM

## 2012-10-23 DIAGNOSIS — E669 Obesity, unspecified: Secondary | ICD-10-CM

## 2012-10-23 LAB — POCT I-STAT, CHEM 8
Creatinine, Ser: 0.9 mg/dL (ref 0.50–1.35)
HCT: 44 % (ref 39.0–52.0)
Hemoglobin: 15 g/dL (ref 13.0–17.0)
Potassium: 3.4 mEq/L — ABNORMAL LOW (ref 3.5–5.1)
Sodium: 142 mEq/L (ref 135–145)

## 2012-10-23 MED ORDER — LISINOPRIL 20 MG PO TABS: 20.0000 mg | ORAL_TABLET | Freq: Every day | ORAL | Status: DC

## 2012-10-23 MED ORDER — CARVEDILOL 12.5 MG PO TABS: 12.5000 mg | ORAL_TABLET | Freq: Two times a day (BID) | ORAL | Status: DC

## 2012-10-23 MED ORDER — SPIRONOLACTONE 25 MG PO TABS: 25.0000 mg | ORAL_TABLET | Freq: Two times a day (BID) | ORAL | Status: DC

## 2012-10-23 MED ORDER — METFORMIN HCL 500 MG PO TABS: 500.0000 mg | ORAL_TABLET | Freq: Two times a day (BID) | ORAL | Status: DC

## 2012-10-23 NOTE — ED Provider Notes (Signed)
Chief Complaint:   Chief Complaint  Patient presents with  . Shortness of Breath    History of Present Illness:   Dave Arnold is a 42 year old male with chronic systolic heart failure, obesity, and hypertension. He comes in today for refills on some medications. He is a very discursive and repetitive historian, but can be redirected and he actually can give a fairly good history. He was seeing Dr. Sharyn Lull, but because of an outstanding bill that he had incurred there, Dr. Sharyn Lull did not see him any further or refill his medications. He's been off of all his meds about 3 months and is concerned that his blood pressure might be high. He's also had a number of symptoms which she attributes to being off his medication. For about the past month he's experienced epigastric pain after meals, his abdomen is bloated, for the past week he's had chest pains that come and go. He attributes this to stress. They're located in the left submammary area. The chest pains not exertional, not associated with meals, and he denies any associated nausea or diaphoresis. He's also had pain in his lower back, and feels a little bit more short of breath than usual. He says his ankles are swollen, but to me they don't look edematous. He denies any PND orthopnea. He's had no coughing or wheezing. No fever, nausea, vomiting, diarrhea, blood in the stool, or urinary symptoms. He was hospitalized at [facility] about a year ago with congestive heart failure. He's been followed by Dr. Sharyn Lull since then up until 3 months ago. The patient states that he was doing well when he was incarcerated from [place]. He worked out every day, kept his weight under control, and got his medications. Since then he let himself go, gained weight, stopped exercising, and his diet is not as good as he would like.  Review of Systems:  Other than noted above, the patient denies any of the following symptoms. Systemic:  No fever, chills,  sweats, fatigue, myalgias, headache, or anorexia. Eye:  No redness, pain or drainage. ENT:  No earache, nasal congestion, rhinorrhea, sinus pressure, or sore throat. Lungs:  No cough, sputum production, wheezing, shortness of breath.  Cardiovascular:  No chest pain, palpitations, or syncope. GI:  No nausea, vomiting, abdominal pain or diarrhea. GU:  No dysuria, frequency, or hematuria. Skin:  No rash or pruritis.  PMFSH:  Past medical history, family history, social history, meds, and allergies were reviewed.  He has no history of diabetes. He has been taking carvedilol 12.5 mg twice a day, lisinopril 20 mg per day, and spironolactone 50 mg twice a day.  Physical Exam:   Vital signs:  BP 198/98  Pulse 69  Temp(Src) 98.7 F (37.1 C) (Oral)  Resp 18  SpO2 95% General:  Alert, in no distress. Eye:  PERRL, full EOMs.  Lids and conjunctivas were normal. ENT:  TMs and canals were normal, without erythema or inflammation.  Nasal mucosa was clear and uncongested, without drainage.  Mucous membranes were moist.  Pharynx was clear, without exudate or drainage.  There were no oral ulcerations or lesions. Neck:  Supple, no adenopathy, tenderness or mass. Thyroid was normal. No JVD. Lungs:  No respiratory distress.  Lungs were clear to auscultation, without wheezes, rales or rhonchi.  Breath sounds were clear and equal bilaterally. Heart:  Regular rhythm, without gallops, murmers or rubs. Abdomen:  Soft, flat, and non-tender to palpation.  No hepatosplenomagaly or mass. Extremities: No edema, pulses  full, foot exam was negative. Skin:  Clear, warm, and dry, without rash or lesions.  Labs:   Results for orders placed during the hospital encounter of 10/23/12  POCT I-STAT, CHEM 8      Result Value Range   Sodium 142  135 - 145 mEq/L   Potassium 3.4 (*) 3.5 - 5.1 mEq/L   Chloride 102  96 - 112 mEq/L   BUN 9  6 - 23 mg/dL   Creatinine, Ser 1.61  0.50 - 1.35 mg/dL   Glucose, Bld 096 (*) 70 - 99  mg/dL   Calcium, Ion 0.45  4.09 - 1.23 mmol/L   TCO2 27  0 - 100 mmol/L   Hemoglobin 15.0  13.0 - 17.0 g/dL   HCT 81.1  91.4 - 78.2 %     Radiology:  Dg Chest 2 View  10/23/2012   *RADIOLOGY REPORT*  Clinical Data: Shortness of breath  CHEST - 2 VIEW  Comparison: July 18, 2011  Findings: There is no edema or consolidation.  Heart is moderately enlarged with normal pulmonary vascularity.  No adenopathy.  No bone lesions.  IMPRESSION: Generalized cardiomegaly.  No edema or consolidation.   Original Report Authenticated By: Bretta Bang, M.D.   EKG Results:  Date: 10/23/2012  Rate: 69  Rhythm: normal sinus rhythm  QRS Axis: normal  Intervals: normal  ST/T Wave abnormalities: nonspecific T wave changes  Conduction Disutrbances:none  Narrative Interpretation: Normal sinus rhythm, nonspecific T wave abnormalities. These are unchanged from his previous EKGs.  Old EKG Reviewed: unchanged  Assessment:  The primary encounter diagnosis was Obesity. Diagnoses of Hypertension, CHF (congestive heart failure), chronic, systolic, and Type 2 diabetes mellitus were also pertinent to this visit.  His blood pressure is elevated and he also appears to have new-onset type 2 diabetes. I have started him on metformin and suggested a followup at our [facility] next week. I do not think that the chest pain is anginal or cardiac related in any way.  Plan:   1.  The following meds were prescribed:   Discharge Medication List as of 10/23/2012 11:16 AM    START taking these medications   Details  metFORMIN (GLUCOPHAGE) 500 MG tablet Take 1 tablet (500 mg total) by mouth 2 (two) times daily with a meal., Starting 10/23/2012, Until Discontinued, Normal       He was also given refills on his carvedilol 12.5 mg twice daily, lisinopril 20 mg once daily, and spironolactone 25 mg twice daily.  2.  The patient was instructed in symptomatic care and handouts were given. 3.  The patient  was told to return if becoming worse in any way, if no better in 3 or 4 days, and given some red flag symptoms such as worsening difficulty breathing or chest pain that would indicate earlier return. 4.  [facility] next [facility].       Reuben Likes, MD 10/23/12 [phone]

## 2012-10-23 NOTE — ED Notes (Signed)
Patient reports recurrent history of todays complaints. Reports todays complaints started about one month ago.  Patient reports feeling like he did the first time he had issues.  Patient c/o chest pain, stomach pain, back pain, bloated with eating, sob.  Patient non compliant with pcp recommendations, did not follow up with pcp, therefore meds not refilled, off meds approx 3 months.

## 2013-04-04 ENCOUNTER — Emergency Department (HOSPITAL_COMMUNITY)
Admission: EM | Admit: 2013-04-04 | Discharge: 2013-04-05 | Disposition: A | Discharge status: Home / Self Care | Payer: Self-pay | Attending: Emergency Medicine | Admitting: Emergency Medicine

## 2013-04-04 ENCOUNTER — Encounter (HOSPITAL_COMMUNITY): Payer: Self-pay | Admitting: Emergency Medicine

## 2013-04-04 ENCOUNTER — Emergency Department (HOSPITAL_COMMUNITY): Payer: Self-pay

## 2013-04-04 DIAGNOSIS — E669 Obesity, unspecified: Secondary | ICD-10-CM | POA: Insufficient documentation

## 2013-04-04 DIAGNOSIS — S0990XA Unspecified injury of head, initial encounter: Secondary | ICD-10-CM | POA: Insufficient documentation

## 2013-04-04 DIAGNOSIS — Z23 Encounter for immunization: Secondary | ICD-10-CM | POA: Insufficient documentation

## 2013-04-04 DIAGNOSIS — S069X9A Unspecified intracranial injury with loss of consciousness of unspecified duration, initial encounter: Secondary | ICD-10-CM

## 2013-04-04 DIAGNOSIS — S02109B Fracture of base of skull, unspecified side, initial encounter for open fracture: Secondary | ICD-10-CM | POA: Insufficient documentation

## 2013-04-04 DIAGNOSIS — Y990 Civilian activity done for income or pay: Secondary | ICD-10-CM | POA: Insufficient documentation

## 2013-04-04 DIAGNOSIS — S0291XB Unspecified fracture of skull, initial encounter for open fracture: Secondary | ICD-10-CM

## 2013-04-04 DIAGNOSIS — Y9389 Activity, other specified: Secondary | ICD-10-CM | POA: Insufficient documentation

## 2013-04-04 DIAGNOSIS — E119 Type 2 diabetes mellitus without complications: Secondary | ICD-10-CM | POA: Insufficient documentation

## 2013-04-04 DIAGNOSIS — S0100XA Unspecified open wound of scalp, initial encounter: Secondary | ICD-10-CM | POA: Insufficient documentation

## 2013-04-04 DIAGNOSIS — S0101XA Laceration without foreign body of scalp, initial encounter: Secondary | ICD-10-CM

## 2013-04-04 DIAGNOSIS — Z79899 Other long term (current) drug therapy: Secondary | ICD-10-CM | POA: Insufficient documentation

## 2013-04-04 DIAGNOSIS — Z7982 Long term (current) use of aspirin: Secondary | ICD-10-CM | POA: Insufficient documentation

## 2013-04-04 DIAGNOSIS — Z862 Personal history of diseases of the blood and blood-forming organs and certain disorders involving the immune mechanism: Secondary | ICD-10-CM | POA: Insufficient documentation

## 2013-04-04 DIAGNOSIS — Y9229 Other specified public building as the place of occurrence of the external cause: Secondary | ICD-10-CM | POA: Insufficient documentation

## 2013-04-04 DIAGNOSIS — W2209XA Striking against other stationary object, initial encounter: Secondary | ICD-10-CM | POA: Insufficient documentation

## 2013-04-04 DIAGNOSIS — I10 Essential (primary) hypertension: Secondary | ICD-10-CM | POA: Insufficient documentation

## 2013-04-04 HISTORY — DX: Type 2 diabetes mellitus without complications: E11.9

## 2013-04-04 LAB — GLUCOSE, CAPILLARY: GLUCOSE-CAPILLARY: 322 mg/dL — AB (ref 70–99)

## 2013-04-04 MED ORDER — METFORMIN HCL 500 MG PO TABS: 1000.0000 mg | ORAL_TABLET | Freq: Once | ORAL | Status: DC

## 2013-04-04 MED ORDER — METFORMIN HCL 500 MG PO TABS
500.0000 mg | ORAL_TABLET | Freq: Once | ORAL | Status: AC
  Administered 2013-04-05: 500 mg via ORAL
  Filled 2013-04-04: qty 1

## 2013-04-04 MED ORDER — TETANUS-DIPHTH-ACELL PERTUSSIS 5-2.5-18.5 LF-MCG/0.5 IM SUSP
0.5000 mL | Freq: Once | INTRAMUSCULAR | Status: AC
  Administered 2013-04-04: 0.5 mL via INTRAMUSCULAR
  Filled 2013-04-04: qty 0.5

## 2013-04-04 NOTE — ED Provider Notes (Signed)
CSN: [id number]     Arrival date & time 04/04/13  1957 History   First MD Initiated Contact with Patient 04/04/13 2219     Chief Complaint  Patient presents with  . Head Laceration   (Consider location/radiation/quality/duration/timing/severity/associated sxs/prior Treatment) HPI Dave Arnold is a 43 y.o. male who presents to emergency department complaining of head injury. He states that he was in the garage and a chain broke a door hit him on the head. Patient had brief loss of consciousness. He states this happened 24 hours ago. He reports large laceration to the left head with constant bleeding since. Pressure was applied at home. He states he also has had nausea, dizziness, headaches since then. He states he is slow to answer and move around. He states he "does not feel like himself." He also reports being off of his blood pressure and diabetes medicine for last 2 weeks. He states he ran out of refills and is currently does not have a primary care Dr. Patient denies any vomiting. He denies any memory loss. He denies any confusion. He did not take any medications prior to coming in. No difficulty with speech or walking.  Past Medical History  Diagnosis Date  . Hypertension   . Obesity   . Heart failure of unknown type 06/2011  . Thrombocytopenia   . Diabetes mellitus without complication    History reviewed. No pertinent past surgical history. History reviewed. No pertinent family history. History  Substance Use Topics  . Smoking status: Never Smoker   . Smokeless tobacco: Not on file  . Alcohol Use: No    Review of Systems  Constitutional: Positive for activity change and fatigue. Negative for fever and chills.  HENT: Negative for ear discharge.   Eyes: Negative for photophobia and visual disturbance.  Respiratory: Negative for cough, chest tightness and shortness of breath.   Cardiovascular: Negative for chest pain, palpitations and leg swelling.  Gastrointestinal:  Negative for nausea, vomiting, abdominal pain, diarrhea and abdominal distention.  Genitourinary: Negative for dysuria, urgency, frequency and hematuria.  Musculoskeletal: Negative for arthralgias, myalgias, neck pain and neck stiffness.  Skin: Positive for wound. Negative for rash.  Allergic/Immunologic: Negative for immunocompromised state.  Neurological: Positive for dizziness, light-headedness and headaches. Negative for weakness and numbness.  Psychiatric/Behavioral: Negative for confusion.    Allergies  Review of patient's allergies indicates no known allergies.  Home Medications   Current Outpatient Rx  Name  Route  Sig  Dispense  Refill  . aspirin EC 325 MG tablet   Oral   Take 325 mg by mouth daily.         . carvedilol (COREG) 12.5 MG tablet   Oral   Take 1 tablet (12.5 mg total) by mouth 2 (two) times daily with a meal.   60 tablet   2   . lisinopril (PRINIVIL,ZESTRIL) 20 MG tablet   Oral   Take 1 tablet (20 mg total) by mouth daily.   30 tablet   2   . metFORMIN (GLUCOPHAGE) 500 MG tablet   Oral   Take 1 tablet (500 mg total) by mouth 2 (two) times daily with a meal.   60 tablet   2   . spironolactone (ALDACTONE) 25 MG tablet   Oral   Take 1 tablet (25 mg total) by mouth 2 (two) times daily.   60 tablet   2   . EXPIRED: carvedilol (COREG) 12.5 MG tablet   Oral   Take 12.5 mg by  mouth every 12 (twelve) hours.         Marland Kitchen. EXPIRED: carvedilol (COREG) 12.5 MG tablet   Oral   Take 1 tablet (12.5 mg total) by mouth 2 (two) times daily with a meal.   180 tablet   0   . EXPIRED: lisinopril (PRINIVIL,ZESTRIL) 10 MG tablet   Oral   Take 10 mg by mouth daily.         Marland Kitchen. EXPIRED: lisinopril (PRINIVIL,ZESTRIL) 20 MG tablet   Oral   Take 0.5 tablets (10 mg total) by mouth daily.   90 tablet   0   . EXPIRED: spironolactone (ALDACTONE) 50 MG tablet   Oral   Take 50 mg by mouth 2 (two) times daily.         Marland Kitchen. EXPIRED: spironolactone (ALDACTONE) 50 MG  tablet   Oral   Take 1 tablet (50 mg total) by mouth 2 (two) times daily.   180 tablet   0    BP 149/84  Pulse 71  Temp(Src) 98.2 F (36.8 C) (Oral)  Resp 16  Ht 6' (1.829 m)  Wt 300 lb (136.079 kg)  BMI 40.68 kg/m2  SpO2 96% Physical Exam  Nursing note and vitals reviewed. Constitutional: He is oriented to person, place, and time. He appears well-developed and well-nourished. No distress.  HENT:  Head: Normocephalic.  Right Ear: External ear normal.  Left Ear: External ear normal.  Nose: Nose normal.  Mouth/Throat: Oropharynx is clear and moist.  TMs normal bilaterally. 4cm laceration to the left temple. No bleeding.   Eyes: Conjunctivae are normal. Pupils are equal, round, and reactive to light.  Neck: Neck supple.  Cardiovascular: Normal rate, regular rhythm and normal heart sounds.   Pulmonary/Chest: Effort normal. No respiratory distress. He has no wheezes. He has no rales.  Abdominal: Soft. Bowel sounds are normal. He exhibits no distension. There is no tenderness. There is no rebound.  Musculoskeletal: He exhibits no edema.  Neurological: He is alert and oriented to person, place, and time. No cranial nerve deficit. Coordination normal.  5/5 and equal upper and lower extremity strength bilaterally. Equal grip strength bilaterally. Normal finger to nose and heel to shin. No pronator drift. gait normal.  Skin: Skin is warm and dry.    ED Course  Procedures (including critical care time) Labs Review Labs Reviewed  GLUCOSE, CAPILLARY - Abnormal; Notable for the following:    Glucose-Capillary 322 (*)    All other components within normal limits  CBC WITH DIFFERENTIAL - Abnormal; Notable for the following:    Platelets 119 (*)    All other components within normal limits  COMPREHENSIVE METABOLIC PANEL - Abnormal; Notable for the following:    Sodium 136 (*)    Chloride 95 (*)    Glucose, Bld 305 (*)    All other components within normal limits   Imaging  Review Dg Cervical Spine Complete  04/05/2013   CLINICAL DATA:  Pain post trauma  EXAM: CERVICAL SPINE  4+ VIEWS  COMPARISON:  None.  FINDINGS: Frontal, lateral, open-mouth odontoid, and bilateral oblique views were obtained. There is no fracture or spondylolisthesis. Prevertebral soft tissues and predental space regions are normal. Disc spaces appear intact. There is no appreciable facet arthropathy on the oblique views. There is slight upper thoracic dextroscoliosis.  IMPRESSION: Mild upper thoracic dextroscoliosis. No fracture or spondylolisthesis.   Electronically Signed   By: Bretta BangWilliam  Woodruff M.D.   On: 04/05/2013 01:41   Ct Head Wo Contrast  04/05/2013  CLINICAL DATA:  Pain and dizziness post trauma  EXAM: CT HEAD WITHOUT CONTRAST  TECHNIQUE: Contiguous axial images were obtained from the base of the skull through the vertex without intravenous contrast.  COMPARISON:  None.  FINDINGS: There is a focal skull fracture involving the left frontal -temporal junction. There is 4 mm of depression of this fracture focally, best seen on axial slice 39, series 3. There is minimal hemorrhage in this area without mass effect from the hemorrhage. There is no intra-axial hemorrhage. There is no mass, midline shift, or focal infarct. Gray-white compartments appear normal on this study. Elsewhere, the bony calvarium is intact. The mastoid air cells are clear. There are retention cysts in the inferior maxillary antra bilaterally. There is mild mucosal thickening in the left sphenoid sinus.  IMPRESSION: There is a depressed skull fracture at the level of the left frontal -temporal junction with minimal hemorrhage in this immediate vicinity. There is no other extra-axial fluid. There is no intra-axial hemorrhage. No mass or focal infarct. Gray-white compartments appear normal. There is mild paranasal sinus disease.   Electronically Signed   By: Bretta Bang M.D.   On: 04/05/2013 00:09    EKG Interpretation   None        MDM  No diagnosis found.  Pt's CBG 322. He is not on metformin at this time. Will restart. Will also refill BP medications. Given syncope, dizziness, "not acting like himself" will get CT head. Laceration repaired with staples. Tetanus updated.   LACERATION REPAIR Performed by: Lottie Mussel Authorized by: Jaynie Crumble A Consent: Verbal consent obtained. Risks and benefits: risks, benefits and alternatives were discussed Consent given by: patient Patient identity confirmed: provided demographic data Prepped and Draped in normal sterile fashion Wound explored  Laceration Location: left temple  Laceration Length: 4cm  No Foreign Bodies seen or palpated  Anesthesia: local infiltration  Local anesthetic: lidocaine 2% w epinephrine  Anesthetic total: 3 ml  Irrigation method: syringe Amount of cleaning: standard  Skin closure: staples  Number of sutures: 5   Patient tolerance: Patient tolerated the procedure well with no immediate complications.   Is patient with depressed skull fracture as above. This incident occurred 24 hours ago. I spoke with Dr. Lovell Sheehan neurosurgery, states that patient is safe for discharge home and he will see him in the office in the morning. Patient given vancomycin and Zosyn in emergency department for open fracture. He'll be discharged home with Keflex and doxycycline. He'll be discharged home with pain medications. I discussed with patient and his family the importance of following up in the morning and taking the medications. Instructed to watch for close signs of infection. At this time he is afebrile and nontoxic appearing. He doesn't have any neuro deficits on my exam. I will refill his blood pressure medications and his metformin. He is instructed to followup.  Filed Vitals:   04/04/13 2012 04/04/13 2231 04/05/13 0207  BP: 142/66 149/84 145/68  Pulse: 61 71 79  Temp: 98.2 F (36.8 C)  98.7 F (37.1 C)  TempSrc: Oral   Oral  Resp: 20 16 20   Height: 6' (1.829 m)    Weight: 300 lb (136.079 kg)    SpO2: 97% 96% 98%     Lottie Mussel, PA-C 04/05/13 0219

## 2013-04-04 NOTE — ED Notes (Signed)
Pt states he was working in the garage and the chain came off the door and the door hit him in the head  Pt states he blacked out for a little bit  Pt has about a one inch laceration noted to the left side of his head  Bleeding controlled at this time

## 2013-04-05 ENCOUNTER — Emergency Department (HOSPITAL_COMMUNITY): Payer: Self-pay

## 2013-04-05 LAB — CBC WITH DIFFERENTIAL/PLATELET
BASOS PCT: 0 % (ref 0–1)
Basophils Absolute: 0 10*3/uL (ref 0.0–0.1)
EOS PCT: 2 % (ref 0–5)
Eosinophils Absolute: 0.2 10*3/uL (ref 0.0–0.7)
HEMATOCRIT: 41.3 % (ref 39.0–52.0)
HEMOGLOBIN: 14.7 g/dL (ref 13.0–17.0)
LYMPHS ABS: 2.8 10*3/uL (ref 0.7–4.0)
Lymphocytes Relative: 32 % (ref 12–46)
MCH: 30 pg (ref 26.0–34.0)
MCHC: 35.6 g/dL (ref 30.0–36.0)
MCV: 84.3 fL (ref 78.0–100.0)
MONO ABS: 0.6 10*3/uL (ref 0.1–1.0)
MONOS PCT: 7 % (ref 3–12)
NEUTROS ABS: 5.3 10*3/uL (ref 1.7–7.7)
Neutrophils Relative %: 59 % (ref 43–77)
PLATELETS: 119 10*3/uL — AB (ref 150–400)
RBC: 4.9 MIL/uL (ref 4.22–5.81)
RDW: 12.8 % (ref 11.5–15.5)
WBC: 8.9 10*3/uL (ref 4.0–10.5)

## 2013-04-05 LAB — COMPREHENSIVE METABOLIC PANEL
ALBUMIN: 3.8 g/dL (ref 3.5–5.2)
ALK PHOS: 101 U/L (ref 39–117)
ALT: 37 U/L (ref 0–53)
AST: 35 U/L (ref 0–37)
BILIRUBIN TOTAL: 0.7 mg/dL (ref 0.3–1.2)
BUN: 11 mg/dL (ref 6–23)
CHLORIDE: 95 meq/L — AB (ref 96–112)
CO2: 28 meq/L (ref 19–32)
CREATININE: 0.74 mg/dL (ref 0.50–1.35)
Calcium: 9.3 mg/dL (ref 8.4–10.5)
GFR calc Af Amer: >90 mL/min (ref >90)
GLUCOSE: 305 mg/dL — AB (ref 70–99)
POTASSIUM: 3.7 meq/L (ref 3.7–5.3)
Sodium: 136 mEq/L — ABNORMAL LOW (ref 137–147)
Total Protein: 7.2 g/dL (ref 6.0–8.3)

## 2013-04-05 MED ORDER — METFORMIN HCL 500 MG PO TABS
500.0000 mg | ORAL_TABLET | Freq: Two times a day (BID) | ORAL | Status: DC
Start: 2013-04-05 — End: 2013-04-15

## 2013-04-05 MED ORDER — HYDROCODONE-ACETAMINOPHEN 5-325 MG PO TABS
1.0000 | ORAL_TABLET | ORAL | Status: DC | PRN
Start: 2013-04-05 — End: 2013-07-06

## 2013-04-05 MED ORDER — DOXYCYCLINE HYCLATE 100 MG PO CAPS: 100.0000 mg | ORAL_CAPSULE | Freq: Two times a day (BID) | ORAL | Status: DC

## 2013-04-05 MED ORDER — CEPHALEXIN 500 MG PO CAPS: 500.0000 mg | ORAL_CAPSULE | Freq: Four times a day (QID) | ORAL | Status: DC

## 2013-04-05 MED ORDER — PIPERACILLIN-TAZOBACTAM 3.375 G IVPB
3.3750 g | Freq: Once | INTRAVENOUS | Status: AC
  Administered 2013-04-05: 3.375 g via INTRAVENOUS
  Filled 2013-04-05: qty 50

## 2013-04-05 MED ORDER — SPIRONOLACTONE 50 MG PO TABS: 50.0000 mg | ORAL_TABLET | Freq: Every day | ORAL | Status: DC

## 2013-04-05 MED ORDER — CARVEDILOL 12.5 MG PO TABS: 12.5000 mg | ORAL_TABLET | Freq: Two times a day (BID) | ORAL | Status: DC

## 2013-04-05 MED ORDER — LISINOPRIL 20 MG PO TABS: 20.0000 mg | ORAL_TABLET | Freq: Every day | ORAL | Status: DC

## 2013-04-05 MED ORDER — FENTANYL CITRATE 0.05 MG/ML IJ SOLN
50.0000 ug | Freq: Once | INTRAMUSCULAR | Status: AC
  Administered 2013-04-05: 50 ug via INTRAVENOUS
  Filled 2013-04-05: qty 2

## 2013-04-05 MED ORDER — VANCOMYCIN HCL IN DEXTROSE 1-5 GM/200ML-% IV SOLN
1000.0000 mg | Freq: Once | INTRAVENOUS | Status: AC
  Administered 2013-04-05: 1000 mg via INTRAVENOUS
  Filled 2013-04-05: qty 200

## 2013-04-05 NOTE — ED Provider Notes (Signed)
Medical screening examination/treatment/procedure(s) were performed by non-physician practitioner and as supervising physician I was immediately available for consultation/collaboration.  EKG Interpretation   None         Junius ArgyleForrest S Dianelys Scinto, MD 04/05/13 1124

## 2013-04-05 NOTE — Discharge Instructions (Signed)
Take doxycycline and keflex for infection. Take norco for severe pain. Ice your head several times a day. Apply bacitracin topically twice a day. See Dr. Lovell Sheehan in the office tomorrow morning. Return to ER if any problems.   Skull Fracture, Uncomplicated, Adult You have a skull fracture. This happens when one of the bones of your head cracks or breaks. Your injury does not appear serious at this time and we feel you can be observed safely at home. An injury to the head that causes a skull fracture may also cause a concussion. With a concussion you may be knocked out for a brief moment (loss of consciousness). A concussion is the mildest form of traumatic brain injury. The symptoms of a concussion are short-lived and resolve on their own. The most common symptoms are confusion and forgetting what happened (amnesia). SYMPTOMS These minor symptoms may be experienced after discharge:  Memory difficulties.  Dizziness.  Headaches.  Hearing difficulties.  Vertigo or trouble with balance.  Depression.  Tiredness.  Difficulty with concentration.  Nausea.  Vomiting. A bruise on the brain (concussion) requires a few days for recovery the same as a bruise elsewhere on your body. It is common for people with injuries such as yours to experience such problems. Usually these problems disappear without medical care. If symptoms remain for more than a few days, notify your caregiver. See your caregiver sooner if symptoms are becoming worse rather than better. HOME CARE INSTRUCTIONS   During the next 24 hours you should stay with an adult who can watch you for the above warning signs.  This person should wake you up every 02-03 hours to check on your condition, noting any of the above signs or symptoms. Problems which are getting worse mean you should call or return immediately to the facility where you were just seen, or to the nearest emergency department. In case of emergency or unconsciousness,  call for local emergency medical help.  You should take clear liquids for the rest of the day and then resume your regular diet.  You should not take sedatives or alcoholic beverages for 48 hours after discharge.  After injuries such as yours, most serious problems happen within the first 24 hours.  If x-rays or other testing were done, make sure you know how you are to get those results. It is your responsibility to call back for results when your caregiver suggests. Do not assume everything is fine if your caregiver has not been able to reach you.  Skull fractures usually do not need follow up x-rays. These fractures are not like broken arms. The position of the skull stays the same as when it was broken and usually heals without problems.  If you have a concussion be aware that symptoms may last up to a week or longer.  It is very important to keep any follow-up appointments after a head injury.  It is unlikely that serious side effects will occur. If they do occur it is usually soon after the accident but may occur as long as a week after the accident or injury. SEEK IMMEDIATE MEDICAL CARE IF:   Confusion or drowsiness develops; children frequently become drowsy after any type of trauma or injury.  A person cannot arouse the injured person.  You feel sick to your stomach (nausea) or persistent, forceful vomiting (projectile in nature).  Rapid back and forth movement of the eyes. This is called nystagmus.  Convulsions, seizures, or unconsciousness.  Severe persistent headaches not relieved by  medication. Only take over-the-counter or prescription medicines for pain, discomfort, or fever as directed by your caregiver. (Do not take aspirin as this weakens blood clotting abilities).  Inability to use arms or legs appropriately.  Changes in pupil sizes.  Clear or bloody discharge from nose or ears. Document Released: 01/16/2004 Document Revised: 06/09/2011 Document Reviewed:  05/23/2008 [facility] Patient Information 2014 [place], [place].  Concussion, Adult A concussion, or closed-head injury, is a brain injury caused by a direct blow to the head or by a quick and sudden movement (jolt) of the head or neck. Concussions are usually not life-threatening. Even so, the effects of a concussion can be serious. If you have had a concussion before, you are more likely to experience concussion-like symptoms after a direct blow to the head.  CAUSES   Direct blow to the head, such as from running into another player during a soccer game, being hit in a fight, or hitting your head on a hard surface.  A jolt of the head or neck that causes the brain to move back and forth inside the skull, such as in a car crash. SIGNS AND SYMPTOMS  The signs of a concussion can be hard to notice. Early on, they may be missed by you, family members, and health care providers. You may look fine but act or feel differently. Symptoms are usually temporary, but they may last for days, weeks, or even longer. Some symptoms may appear right away while others may not show up for hours or days. Every head injury is different. Symptoms include:   Mild to moderate headaches that will not go away.  A feeling of pressure inside your head.  Having more trouble than usual:   Learning or remembering things you have heard.  Answering questions.  Paying attention or concentrating.   Organizing daily tasks.   Making decisions and solving problems.   Slowness in thinking, acting or reacting, speaking, or reading.   Getting lost or being easily confused.   Feeling tired all the time or lacking energy (fatigued).   Feeling drowsy.   Sleep disturbances.   Sleeping more than usual.   Sleeping less than usual.   Trouble falling asleep.   Trouble sleeping (insomnia).   Loss of balance or feeling lightheaded or dizzy.   Nausea or vomiting.   Numbness or tingling.   Increased  sensitivity to:   Sounds.   Lights.   Distractions.   Vision problems or eyes that tire easily.   Diminished sense of taste or smell.   Ringing in the ears.   Mood changes such as feeling sad or anxious.   Becoming easily irritated or angry for little or no reason.   Lack of motivation.  Seeing or hearing things other people do not see or hear (hallucinations). DIAGNOSIS  Your health care provider can usually diagnose a concussion based on a description of your injury and symptoms. He or she will ask whether you passed out (lost consciousness) and whether you are having trouble remembering events that happened right before and during your injury.  Your evaluation might include:   A brain scan to look for signs of injury to the brain. Even if the test shows no injury, you may still have a concussion.   Blood tests to be sure other problems are not present. TREATMENT   Concussions are usually treated in an emergency department, in urgent care, or at a clinic. You may need to stay in the hospital overnight for further  treatment.   Tell your health care provider if you are taking any medicines, including prescription medicines, over-the-counter medicines, and natural remedies. Some medicines, such as blood thinners (anticoagulants) and aspirin, may increase the chance of complications. Also tell your health care provider whether you have had alcohol or are taking illegal drugs. This information may affect treatment.  Your health care provider will send you home with important instructions to follow.  How fast you will recover from a concussion depends on many factors. These factors include how severe your concussion is, what part of your brain was injured, your age, and how healthy you were before the concussion.  Most people with mild injuries recover fully. Recovery can take time. In general, recovery is slower in older persons. Also, persons who have had a concussion  in the past or have other medical problems may find that it takes longer to recover from their current injury. HOME CARE INSTRUCTIONS  General Instructions  Carefully follow the directions your health care provider gave you.  Only take over-the-counter or prescription medicines for pain, discomfort, or fever as directed by your health care provider.  Take only those medicines that your health care provider has approved.  Do not drink alcohol until your health care provider says you are well enough to do so. Alcohol and certain other drugs may slow your recovery and can put you at risk of further injury.  If it is harder than usual to remember things, write them down.  If you are easily distracted, try to do one thing at a time. For example, do not try to watch TV while fixing dinner.  Talk with family members or close friends when making important decisions.  Keep all follow-up appointments. Repeated evaluation of your symptoms is recommended for your recovery.  Watch your symptoms and tell others to do the same. Complications sometimes occur after a concussion. Older adults with a brain injury may have a higher risk of serious complications such as of a blood clot on the brain.  Tell your teachers, school nurse, school counselor, coach, athletic trainer, or work Production designer, theatre/television/film about your injury, symptoms, and restrictions. Tell them about what you can or cannot do. They should watch for:   Increased problems with attention or concentration.   Increased difficulty remembering or learning new information.   Increased time needed to complete tasks or assignments.   Increased irritability or decreased ability to cope with stress.   Increased symptoms.   Rest. Rest helps the brain to heal. Make sure you:  Get plenty of sleep at night. Avoid staying up late at night.  Keep the same bedtime hours on weekends and weekdays.  Rest during the day. Take daytime naps or rest breaks when  you feel tired.  Limit activities that require a lot of thought or concentration. These includes   Doing homework or job-related work.   Watching TV.   Working on the computer.  Avoid any situation where there is potential for another head injury (football, hockey, soccer, basketball, martial arts, downhill snow sports and horseback riding). Your condition will get worse every time you experience a concussion. You should avoid these activities until you are evaluated by the appropriate follow-up caregivers. Returning To Your Regular Activities You will need to return to your normal activities slowly, not all at once. You must give your body and brain enough time for recovery.  Do not return to sports or other athletic activities until your health care provider tells you it  is safe to do so.  Ask your health care provider when you can drive, ride a bicycle, or operate heavy machinery. Your ability to react may be slower after a brain injury. Never do these activities if you are dizzy.  Ask your health care provider about when you can return to work or school. Preventing Another Concussion It is very important to avoid another brain injury, especially before you have recovered. In rare cases, another injury can lead to permanent brain damage, brain swelling, or death. The risk of this is greatest during the first 7 10 days after a head injury. Avoid injuries by:   Wearing a seat belt when riding in a car.   Drinking alcohol only in moderation.   Wearing a helmet when biking, skiing, skateboarding, skating, or doing similar activities.  Avoiding activities that could lead to a second concussion, such as contact or recreational sports, until your health care provider says it is OK.  Taking safety measures in your home.   Remove clutter and tripping hazards from floors and stairways.   Use grab bars in bathrooms and handrails by stairs.   Place non-slip mats on floors and in  bathtubs.   Improve lighting in dim areas. SEEK MEDICAL CARE IF:   You have increased problems paying attention or concentrating.   You have increased difficulty remembering or learning new information.   You need more time to complete tasks or assignments than before.   You have increased irritability or decreased ability to cope with stress.  You have more symptoms than before. Seek medical care if you have any of the following symptoms for more than 2 weeks after your injury:   Lasting (chronic) headaches.   Dizziness or balance problems.   Nausea.  Vision problems.   Increased sensitivity to noise or light.   Depression or mood swings.   Anxiety or irritability.   Memory problems.   Difficulty concentrating or paying attention.   Sleep problems.   Feeling tired all the time. SEEK IMMEDIATE MEDICAL CARE IF:   You have severe or worsening headaches. These may be a sign of a blood clot in the brain.  You have weakness (even if only in one hand, leg, or part of the face).  You have numbness.  You have decreased coordination.   You vomit repeatedly.  You have increased sleepiness.  One pupil is larger than the other.   You have convulsions.   You have slurred speech.   You have increased confusion. This may be a sign of a blood clot in the brain.  You have increased restlessness, agitation, or irritability.   You are unable to recognize people or places.   You have neck pain.   It is difficult to wake you up.   You have unusual behavior changes.   You lose consciousness. MAKE SURE YOU:   Understand these instructions.  Will watch your condition.  Will get help right away if you are not doing well or get worse. Document Released: 06/07/2003 Document Revised: 11/17/2012 Document Reviewed: 10/07/2012 [facility] Patient Information 2014 [place], [place].

## 2013-04-05 NOTE — ED Notes (Signed)
Patient is alert and oriented x3.  He was given DC instructions and follow up visit instructions.  Patient gave verbal understanding.  He was DC ambulatory under his own power to home.  V/S stable.  He was not showing any signs of distress on DC 

## 2013-04-15 ENCOUNTER — Emergency Department (HOSPITAL_COMMUNITY)
Admission: EM | Admit: 2013-04-15 | Discharge: 2013-04-15 | Disposition: A | Discharge status: Home / Self Care | Payer: Self-pay | Attending: Emergency Medicine | Admitting: Emergency Medicine

## 2013-04-15 DIAGNOSIS — Z792 Long term (current) use of antibiotics: Secondary | ICD-10-CM | POA: Insufficient documentation

## 2013-04-15 DIAGNOSIS — E119 Type 2 diabetes mellitus without complications: Secondary | ICD-10-CM | POA: Insufficient documentation

## 2013-04-15 DIAGNOSIS — Z4802 Encounter for removal of sutures: Secondary | ICD-10-CM | POA: Insufficient documentation

## 2013-04-15 DIAGNOSIS — I252 Old myocardial infarction: Secondary | ICD-10-CM | POA: Insufficient documentation

## 2013-04-15 DIAGNOSIS — Z79899 Other long term (current) drug therapy: Secondary | ICD-10-CM | POA: Insufficient documentation

## 2013-04-15 DIAGNOSIS — I10 Essential (primary) hypertension: Secondary | ICD-10-CM | POA: Insufficient documentation

## 2013-04-15 DIAGNOSIS — E669 Obesity, unspecified: Secondary | ICD-10-CM | POA: Insufficient documentation

## 2013-04-15 DIAGNOSIS — Z862 Personal history of diseases of the blood and blood-forming organs and certain disorders involving the immune mechanism: Secondary | ICD-10-CM | POA: Insufficient documentation

## 2013-04-15 NOTE — ED Provider Notes (Signed)
CSN: [id number]     Arrival date & time 04/15/13  1657 History  This chart was scribed for Fayrene Helper, PA,  working with Gavin Pound. Oletta Lamas, MD, by Ardelia Mems ED Scribe. This patient was seen in room WTR6/WTR6 and the patient's care was started at 6:07 PM.    Chief Complaint  Patient presents with  . Suture / Staple Removal    The history is provided by the patient. No language interpreter was used.    HPI Comments: Dave Arnold is a 43 y.o. male who presents to the Emergency Department requesting removal of staples form his forehead. He states that a garage door hit him in the head about 9 days ago, when he sustained a laceration to the left side of his forehead. He states that he was seen for this 9 days ago, and had staples placed in his forehead. He states that the area has been healing normally. He denies worsening pain or pus drainage.   Past Medical History  Diagnosis Date  . Hypertension   . Obesity   . Heart failure of unknown type 06/2011  . Thrombocytopenia   . Diabetes mellitus without complication    No past surgical history on file. No family history on file. History  Substance Use Topics  . Smoking status: Never Smoker   . Smokeless tobacco: Not on file  . Alcohol Use: No    Review of Systems  Skin: Positive for wound.  All other systems reviewed and are negative.   Allergies  Review of patient's allergies indicates no known allergies.  Home Medications   Current Outpatient Rx  Name  Route  Sig  Dispense  Refill  . carvedilol (COREG) 12.5 MG tablet   Oral   Take 1 tablet (12.5 mg total) by mouth 2 (two) times daily with a meal.   60 tablet   2   . cephALEXin (KEFLEX) 500 MG capsule   Oral   Take 1 capsule (500 mg total) by mouth 4 (four) times daily.   28 capsule   0   . doxycycline (VIBRAMYCIN) 100 MG capsule   Oral   Take 1 capsule (100 mg total) by mouth 2 (two) times daily.   14 capsule   0   . HYDROcodone-acetaminophen  (NORCO/VICODIN) 5-325 MG per tablet   Oral   Take 1-2 tablets by mouth every 4 (four) hours as needed.   15 tablet   0   . lisinopril (PRINIVIL,ZESTRIL) 20 MG tablet   Oral   Take 1 tablet (20 mg total) by mouth daily.   30 tablet   2   . metFORMIN (GLUCOPHAGE) 500 MG tablet   Oral   Take 1 tablet (500 mg total) by mouth 2 (two) times daily with a meal.   60 tablet   2   . spironolactone (ALDACTONE) 25 MG tablet   Oral   Take 25 mg by mouth daily.          Triage Vitals: BP 137/77  Pulse 66  Temp(Src) 98 F (36.7 C) (Oral)  Resp 16  SpO2 95%  Physical Exam  Nursing note and vitals reviewed. Constitutional: He is oriented to person, place, and time. He appears well-developed and well-nourished. No distress.  HENT:  Head: Normocephalic.  2 cm superficial, well-healing laceration noted to the left temporal region with well-healing scab. Staples have been removed. No drainage, no signs of infection.  Eyes: EOM are normal.  Neck: Neck supple. No tracheal deviation present.  Cardiovascular: Normal rate.   Pulmonary/Chest: Effort normal. No respiratory distress.  Musculoskeletal: Normal range of motion.  Neurological: He is alert and oriented to person, place, and time.  Skin: Skin is warm and dry.  Psychiatric: He has a normal mood and affect. His behavior is normal.    ED Course  Procedures (including critical care time)  DIAGNOSTIC STUDIES: Oxygen Saturation is 95% on RA, normal by my interpretation.    COORDINATION OF CARE: 6:11 PM- Pt advised of plan for treatment and pt agrees.  SUTURE REMOVAL Performed by: Fayrene HelperRAN,Rusty Villella  Consent: Verbal consent obtained. Consent given by: patient Required items: required blood products, implants, devices, and special equipment available Time out: Immediately prior to procedure a "time out" was called to verify the correct patient, procedure, equipment, support staff and site/side marked as required.  Location: L  forehead  Wound Appearance: clean  Sutures/Staples Removed: staples  Patient tolerance: Patient tolerated the procedure well with no immediate complications.     Labs Review Labs Reviewed - No data to display Imaging Review No results found.  EKG Interpretation   None       MDM   1. Encounter for staple removal    BP 137/77  Pulse 66  Temp(Src) 98 F (36.7 C) (Oral)  Resp 16  SpO2 95%  I personally performed the services described in this documentation, which was scribed in my presence. The recorded information has been reviewed and is accurate.     Fayrene HelperBowie Luticia Tadros, PA-C 04/15/13 [id number]

## 2013-04-15 NOTE — Discharge Instructions (Signed)
Staple Care and Removal  Your caregiver has used staples today to repair your wound. Staples are used to help a wound heal faster by holding the edges of the wound together. The staples can be removed when the wound has healed well enough to stay together after the staples are removed. A dressing (wound covering), depending on the location of the wound, may have been applied. This may be changed once per day or as instructed. If the dressing sticks, it may be soaked off with soapy water or hydrogen peroxide.  Only take over-the-counter or prescription medicines for pain, discomfort, or fever as directed by your caregiver.   If you did not receive a tetanus shot today because you did not recall when your last one was given, check with your caregiver when you have your staples removed to determine if one is needed.  Return to your caregiver's office in 1 week or as suggested to have your staples removed.  SEEK IMMEDIATE MEDICAL CARE IF:    You have redness, swelling, or increasing pain in the wound.   You have pus coming from the wound.   You have a fever.   You notice a bad smell coming from the wound or dressing.   Your wound edges break open after staples have been removed.  Document Released: 12/10/2000 Document Revised: 06/09/2011 Document Reviewed: 12/25/2004  [employer] Patient Information 2014 [employer].

## 2013-04-15 NOTE — ED Notes (Signed)
Removed 5 staples for L side of head near forehead. Pt tolerated well. Wound edges approximated well. No signs of infection noted.

## 2013-04-15 NOTE — ED Notes (Signed)
Pt here for removal of staples to L side of head near forehead. No redness, swelling or pus drainage from wound. Pt has 5 staples. Pt has no other complaints at this time. Pt alert, with no acute distress.

## 2013-04-16 NOTE — ED Provider Notes (Signed)
Medical screening examination/treatment/procedure(s) were performed by non-physician practitioner and as supervising physician I was immediately available for consultation/collaboration.  EKG Interpretation   None         Gavin PoundMichael Y. Murle Otting, MD 04/16/13 1320

## 2013-06-01 IMAGING — CR DG CHEST 2V
3 series · 3 of 3 positions shown · non-contrast
Comparison: 01/18/2008

CLINICAL DATA: Right-sided chest pain for 1 week.  Shortness of
breath, hypertension, pleurisy.

CHEST - 2 VIEW

[w chest pa]
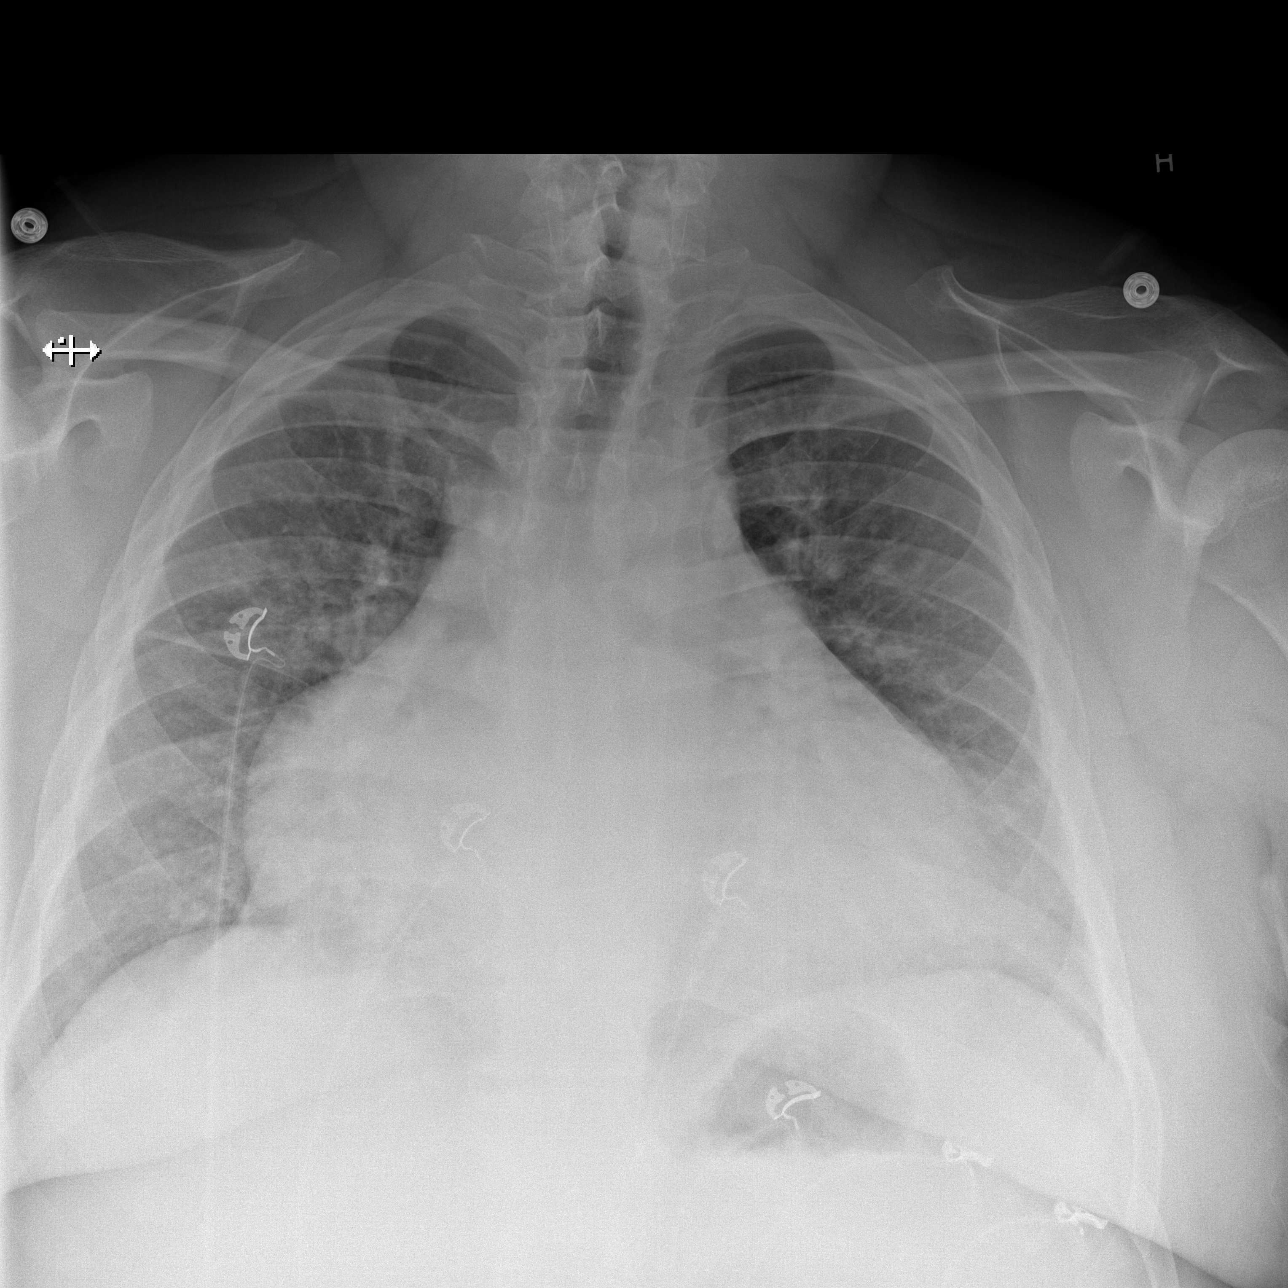

[w chest lat (1 of 2)]
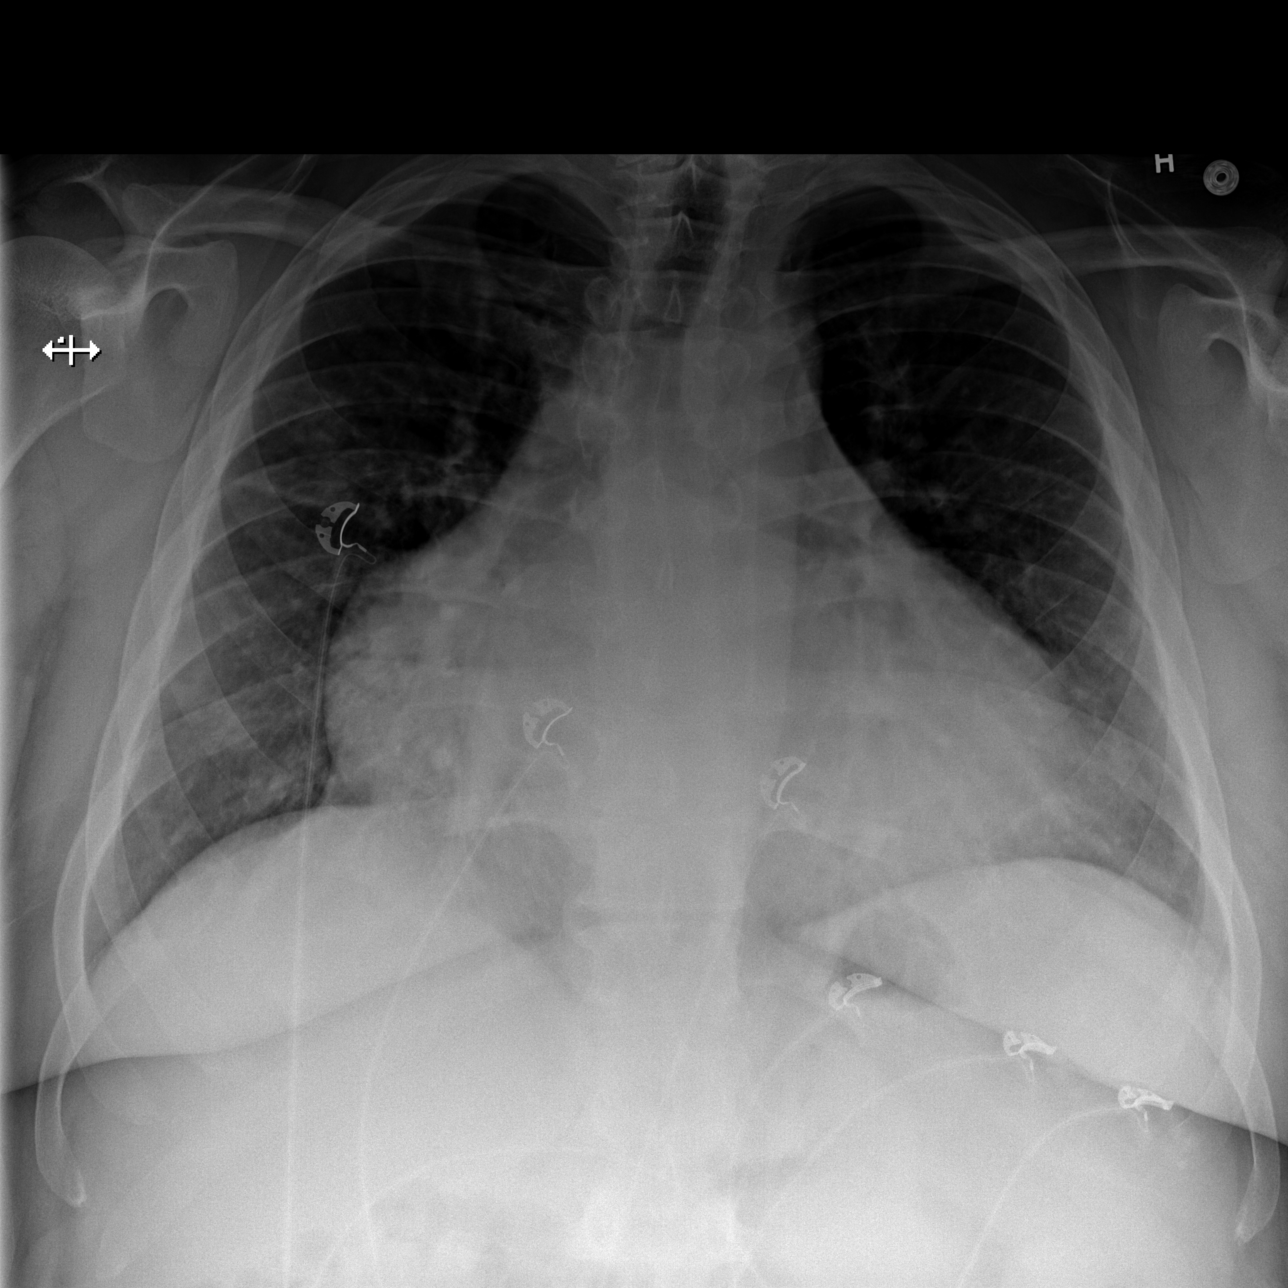

[w chest lat (2 of 2)]
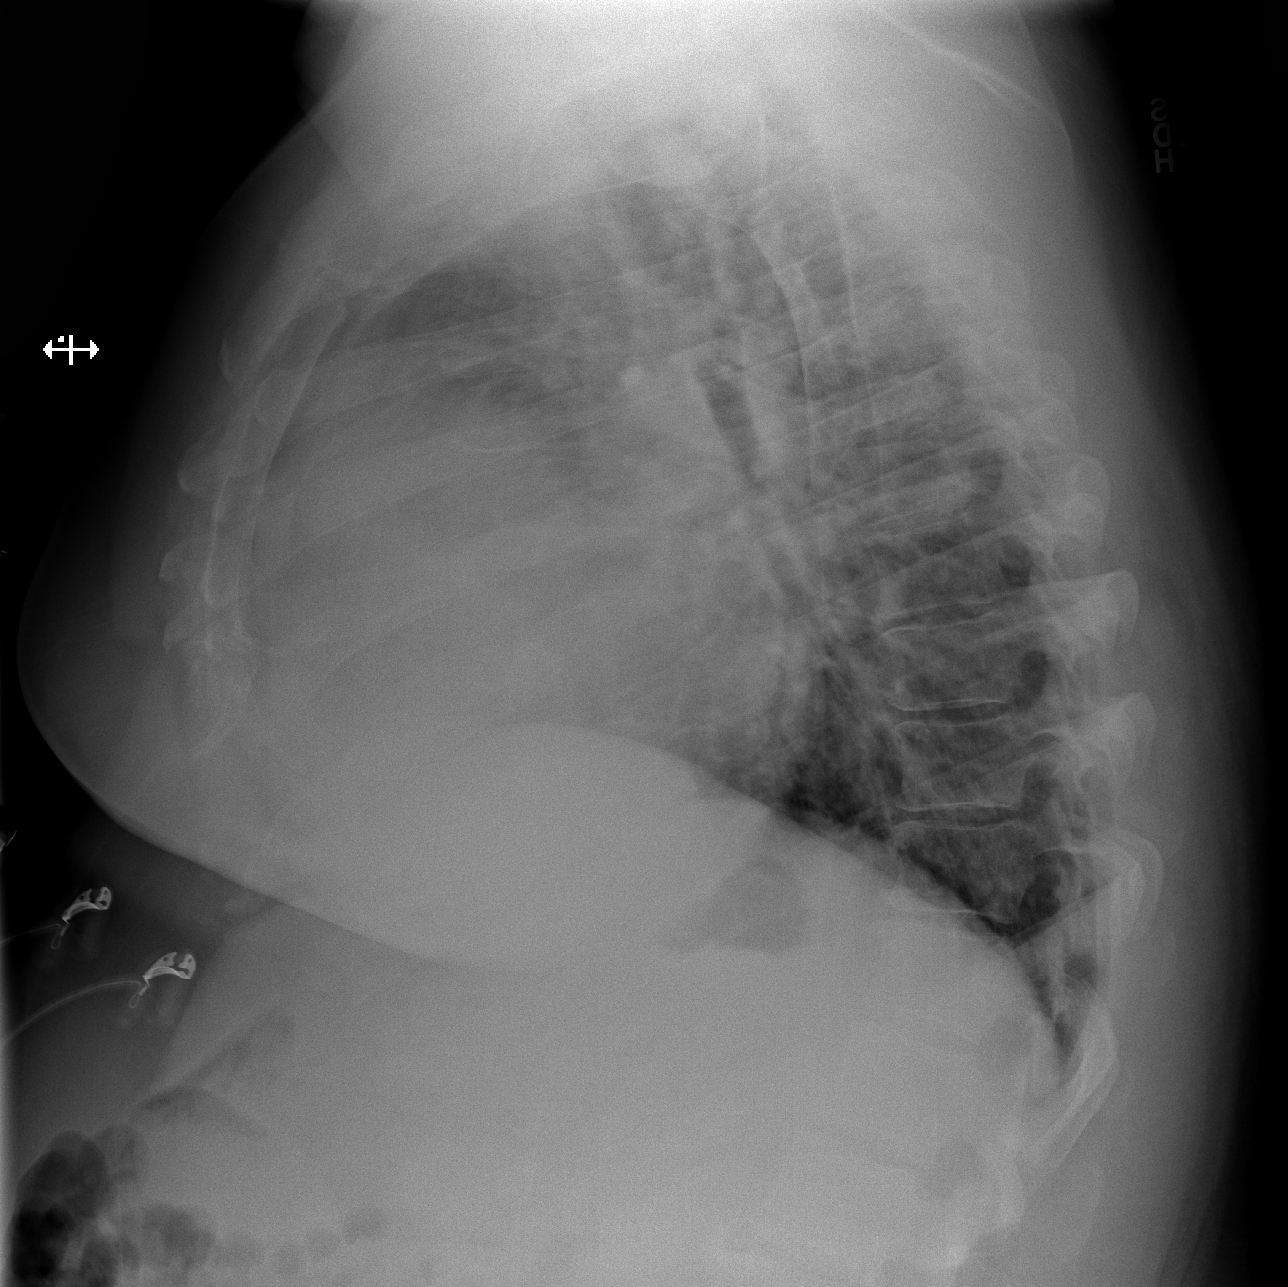

[3 of 3 positions shown; findings below may reference images not displayed]

FINDINGS: The heart is enlarged.  There is prominence of
interstitial markings, consistent with interstitial edema.  There
are no focal consolidations.  No overt alveolar edema.
Degenerative changes are seen in the spine.
IMPRESSION: Marked cardiomegaly and mild interstitial edema.

## 2013-07-06 ENCOUNTER — Observation Stay (HOSPITAL_COMMUNITY)
Admission: EM | Admit: 2013-07-06 | Discharge: 2013-07-08 | Disposition: A | Discharge status: Home / Self Care | Payer: Self-pay | Attending: Internal Medicine | Admitting: Internal Medicine

## 2013-07-06 ENCOUNTER — Emergency Department (HOSPITAL_COMMUNITY): Payer: Self-pay

## 2013-07-06 ENCOUNTER — Encounter (HOSPITAL_COMMUNITY): Payer: Self-pay | Admitting: Emergency Medicine

## 2013-07-06 DIAGNOSIS — R51 Headache: Secondary | ICD-10-CM | POA: Insufficient documentation

## 2013-07-06 DIAGNOSIS — Z79899 Other long term (current) drug therapy: Secondary | ICD-10-CM | POA: Insufficient documentation

## 2013-07-06 DIAGNOSIS — R7309 Other abnormal glucose: Secondary | ICD-10-CM

## 2013-07-06 DIAGNOSIS — R0789 Other chest pain: Secondary | ICD-10-CM | POA: Diagnosis present

## 2013-07-06 DIAGNOSIS — R739 Hyperglycemia, unspecified: Secondary | ICD-10-CM

## 2013-07-06 DIAGNOSIS — R079 Chest pain, unspecified: Secondary | ICD-10-CM

## 2013-07-06 DIAGNOSIS — D696 Thrombocytopenia, unspecified: Secondary | ICD-10-CM

## 2013-07-06 DIAGNOSIS — Z7982 Long term (current) use of aspirin: Secondary | ICD-10-CM | POA: Insufficient documentation

## 2013-07-06 DIAGNOSIS — Z91199 Patient's noncompliance with other medical treatment and regimen due to unspecified reason: Secondary | ICD-10-CM | POA: Insufficient documentation

## 2013-07-06 DIAGNOSIS — I5021 Acute systolic (congestive) heart failure: Secondary | ICD-10-CM

## 2013-07-06 DIAGNOSIS — E669 Obesity, unspecified: Secondary | ICD-10-CM | POA: Insufficient documentation

## 2013-07-06 DIAGNOSIS — I10 Essential (primary) hypertension: Secondary | ICD-10-CM | POA: Insufficient documentation

## 2013-07-06 DIAGNOSIS — E1100 Type 2 diabetes mellitus with hyperosmolarity without nonketotic hyperglycemic-hyperosmolar coma (NKHHC): Principal | ICD-10-CM | POA: Insufficient documentation

## 2013-07-06 DIAGNOSIS — R42 Dizziness and giddiness: Secondary | ICD-10-CM | POA: Insufficient documentation

## 2013-07-06 DIAGNOSIS — E870 Hyperosmolality and hypernatremia: Secondary | ICD-10-CM | POA: Diagnosis present

## 2013-07-06 DIAGNOSIS — Z9119 Patient's noncompliance with other medical treatment and regimen: Secondary | ICD-10-CM | POA: Insufficient documentation

## 2013-07-06 LAB — I-STAT TROPONIN, ED: TROPONIN I, POC: 0 ng/mL (ref 0.00–0.08)

## 2013-07-06 LAB — CBC
HEMATOCRIT: 41.1 % (ref 39.0–52.0)
Hemoglobin: 14.7 g/dL (ref 13.0–17.0)
MCH: 30.6 pg (ref 26.0–34.0)
MCHC: 35.8 g/dL (ref 30.0–36.0)
MCV: 85.4 fL (ref 78.0–100.0)
PLATELETS: 123 10*3/uL — AB (ref 150–400)
RBC: 4.81 MIL/uL (ref 4.22–5.81)
RDW: 12.6 % (ref 11.5–15.5)
WBC: 9.5 10*3/uL (ref 4.0–10.5)

## 2013-07-06 LAB — URINALYSIS, ROUTINE W REFLEX MICROSCOPIC
BILIRUBIN URINE: NEGATIVE
Hgb urine dipstick: NEGATIVE
Ketones, ur: NEGATIVE mg/dL
Leukocytes, UA: NEGATIVE
Nitrite: NEGATIVE
Protein, ur: NEGATIVE mg/dL
Specific Gravity, Urine: 1.034 — ABNORMAL HIGH (ref 1.005–1.030)
UROBILINOGEN UA: 0.2 mg/dL (ref 0.0–1.0)
pH: 5 (ref 5.0–8.0)

## 2013-07-06 LAB — CBG MONITORING, ED
GLUCOSE-CAPILLARY: 462 mg/dL — AB (ref 70–99)
Glucose-Capillary: 389 mg/dL — ABNORMAL HIGH (ref 70–99)
Glucose-Capillary: 415 mg/dL — ABNORMAL HIGH (ref 70–99)
Glucose-Capillary: 479 mg/dL — ABNORMAL HIGH (ref 70–99)

## 2013-07-06 LAB — BASIC METABOLIC PANEL
BUN: 17 mg/dL (ref 6–23)
CALCIUM: 9.7 mg/dL (ref 8.4–10.5)
CO2: 21 mEq/L (ref 19–32)
Chloride: 89 mEq/L — ABNORMAL LOW (ref 96–112)
Creatinine, Ser: 1.04 mg/dL (ref 0.50–1.35)
GFR calc non Af Amer: 88 mL/min — ABNORMAL LOW (ref >90)
Glucose, Bld: 584 mg/dL (ref 70–99)
Potassium: 4.6 mEq/L (ref 3.7–5.3)
Sodium: 128 mEq/L — ABNORMAL LOW (ref 137–147)

## 2013-07-06 LAB — URINE MICROSCOPIC-ADD ON

## 2013-07-06 MED ORDER — SODIUM CHLORIDE 0.9 % IV SOLN
INTRAVENOUS | Status: DC
  Filled 2013-07-06: qty 1

## 2013-07-06 MED ORDER — SODIUM CHLORIDE 0.9 % IV BOLUS (SEPSIS)
1000.0000 mL | Freq: Once | INTRAVENOUS | Status: AC
  Administered 2013-07-06: 1000 mL via INTRAVENOUS

## 2013-07-06 MED ORDER — INSULIN ASPART 100 UNIT/ML ~~LOC~~ SOLN
10.0000 [IU] | Freq: Once | SUBCUTANEOUS | Status: AC
  Administered 2013-07-06: 10 [IU] via INTRAVENOUS
  Filled 2013-07-06: qty 1

## 2013-07-06 MED ORDER — SODIUM CHLORIDE 0.9 % IV BOLUS (SEPSIS)
500.0000 mL | Freq: Once | INTRAVENOUS | Status: AC
  Administered 2013-07-06: 500 mL via INTRAVENOUS

## 2013-07-06 MED ORDER — SODIUM CHLORIDE 0.9 % IV SOLN: 1000.0000 mL | INTRAVENOUS | Status: DC

## 2013-07-06 MED ORDER — DEXTROSE-NACL 5-0.45 % IV SOLN
INTRAVENOUS | Status: DC
Start: 2013-07-06 — End: 2013-07-07

## 2013-07-06 NOTE — ED Notes (Signed)
Pt CBG 415

## 2013-07-06 NOTE — H&P (Signed)
History and Physical       Hospital Admission Note Date: 07/06/2013  Patient name: Dave Arnold Medical record number: [id number] Date of birth: [birth year] Age: 43 y.o. Gender: male  PCP: Patient has no PCP    Chief Complaint:  Elevated blood sugars, chest pain and right flank pain, headache  HPI: Patient is a 43 year old male with history of diabetes mellitus, uncontrolled, hypertension, CHF presented to the ER with multiple complaints. Patient reports that he's been having difficulty controlling his blood sugars, having polyuria and polydipsia for last several days, going more frequently and having nausea, pain when his right flank. He also described some shortness of breath and pain in upper chest, at times feels like acid reflux. He does not have any primary care physician for his regular care.  Review of Systems:  Constitutional: Denies fever, chills, diaphoresis, + poor appetite and fatigue.  HEENT: Denies photophobia, eye pain, redness, hearing loss, ear pain, congestion, sore throat, rhinorrhea, sneezing, mouth sores, trouble swallowing, neck pain, neck stiffness and tinnitus.   Respiratory: Denies  cough, and wheezing.  + shortness of breath Cardiovascular: Please see history of present illness Gastrointestinal: Denies abdominal pain, diarrhea, constipation, blood in stool and abdominal distention. + nausea and vomiting Genitourinary: Denies dysuria, urgency, frequency, hematuria, flank pain and difficulty urinating.  Musculoskeletal: Denies myalgias, back pain, joint swelling, arthralgias and gait problem.  Skin: Denies pallor, rash and wound.  Neurological: Denies dizziness, seizures, syncope, weakness, light-headedness, numbness and + headaches.  he states that he had injury to his head from the garage door that fell on his head in January 2015 Hematological: Denies adenopathy. Easy bruising, personal or family bleeding  history  Psychiatric/Behavioral: Denies suicidal ideation, mood changes, confusion, nervousness, sleep disturbance and agitation  Past Medical History: Past Medical History  Diagnosis Date  . Hypertension   . Obesity   . Heart failure of unknown type 06/2011  . Thrombocytopenia   . Diabetes mellitus without complication    No past surgical history on file.  Medications: Prior to Admission medications   Medication Sig Start Date End Date Taking? Authorizing Provider  aspirin EC 325 MG tablet Take 325 mg by mouth daily. Takes once to twice per day.   Yes Historical Provider, MD  carvedilol (COREG) 12.5 MG tablet Take 1 tablet (12.5 mg total) by mouth 2 (two) times daily with a meal. 10/23/12  Yes Reuben Likes, MD  lisinopril (PRINIVIL,ZESTRIL) 20 MG tablet Take 1 tablet (20 mg total) by mouth daily. 10/23/12  Yes Reuben Likes, MD  metFORMIN (GLUCOPHAGE) 500 MG tablet Take 1 tablet (500 mg total) by mouth 2 (two) times daily with a meal. 10/23/12  Yes Reuben Likes, MD  spironolactone (ALDACTONE) 25 MG tablet Take 25 mg by mouth daily.   Yes Historical Provider, MD    Allergies:  No Known Allergies  Social History:  reports that he has never smoked. He does not have any smokeless tobacco history on file. He reports that he does not drink alcohol or use illicit drugs.  Family History: No family history on file.  Physical Exam: Blood pressure 115/65, pulse 102, temperature 97.6 F (36.4 C), temperature source Oral, resp. rate 18, SpO2 93.00%. General: Alert, awake, oriented x3, in no acute distress. HEENT: normocephalic, atraumatic, anicteric sclera, pink conjunctiva, pupils equal and reactive to light and accomodation, oropharynx clear Neck: supple, no masses or lymphadenopathy, no goiter, no bruits  Heart: Regular rate and rhythm, without murmurs, rubs or gallops.  Lungs: Clear to auscultation bilaterally, no wheezing, rales or rhonchi. No chest wall tenderness Abdomen: Soft,  nontender, nondistended, positive bowel sounds, no masses. No CV angle tenderness Extremities: No clubbing, cyanosis or edema with positive pedal pulses. Neuro: Grossly intact, no focal neurological deficits, strength 5/5 upper and lower extremities bilaterally Psych: alert and oriented x 3, normal mood and affect Skin: no rashes or lesions, warm and dry   LABS on Admission:  Basic Metabolic Panel:  Recent Labs Lab 07/06/13 1626  NA 128*  K 4.6  CL 89*  CO2 21  GLUCOSE 584*  BUN 17  CREATININE 1.04  CALCIUM 9.7   Liver Function Tests: No results found for this basename: AST, ALT, ALKPHOS, BILITOT, PROT, ALBUMIN,  in the last 168 hours No results found for this basename: LIPASE, AMYLASE,  in the last 168 hours No results found for this basename: AMMONIA,  in the last 168 hours CBC:  Recent Labs Lab 07/06/13 1626  WBC 9.5  HGB 14.7  HCT 41.1  MCV 85.4  PLT 123*   Cardiac Enzymes: No results found for this basename: CKTOTAL, CKMB, CKMBINDEX, TROPONINI,  in the last 168 hours BNP: No components found with this basename: POCBNP,  CBG:  Recent Labs Lab 07/06/13 2041 07/06/13 2137  GLUCAP 462* 415*     Radiological Exams on Admission: Dg Chest 2 View  07/06/2013   CLINICAL DATA:  Chest pain  EXAM: CHEST  2 VIEW  COMPARISON:  Prior radiograph from 10/23/2012  FINDINGS: Cardiomegaly is stable as compared to prior study. Mediastinal silhouette within normal limits.  The lungs are normally inflated. No airspace consolidation, pleural effusion, or pulmonary edema is identified. There is no pneumothorax.  No acute osseous abnormality identified.  IMPRESSION: Stable cardiomegaly with no acute cardiopulmonary abnormality identified.   Electronically Signed   By: Rise Mu M.D.   On: 07/06/2013 19:16   Ct Head Wo Contrast  07/06/2013   CLINICAL DATA:  Headache and dizziness. Skull fracture 3 months ago.  EXAM: CT HEAD WITHOUT CONTRAST  TECHNIQUE: Contiguous axial  images were obtained from the base of the skull through the vertex without intravenous contrast.  COMPARISON:  04/04/2013  FINDINGS: Ventricles, cisterns and other CSF spaces are within normal. There is no mass, mass effect, shift of midline structures or acute hemorrhage. There is no evidence of acute infarction. There is evidence of patient's known left frontoparietal subacute/chronic skull fracture with mild interval healing. There is mucosal membrane thickening over the maxillary sinuses right worse than left.  IMPRESSION: No acute intracranial findings.  Healing subacute to chronic left frontoparietal skull fracture.  Chronic sinus inflammatory disease.   Electronically Signed   By: Elberta Fortis M.D.   On: 07/06/2013 18:41    Assessment/Plan Principal Problem:   Hyperglycemia with hyperosmolar diabetes mellitus; uncontrolled - Will place on IV insulin glucose stabilizer, UA shows no ketones - Obtain hemoglobin A1c patient will likely need IV insulin at discharge, extensive diabetic teaching and nutrition consult -  once CBGs are consistently below 180, transition to subcutaneous insulin and wean off his   Active Problems:   Hypertension - Continue Coreg and lisinopril     Atypical chest pain - Does not appear to be cardiac, however obtain 3 sets of cardiac enzymes, he has number of complaints including sometimes feeling short of breath, obtain 2-D echo for further workup. Also placed on PPI. Continue aspirin   DVT prophylaxis: lovenox  CODE STATUS: full code   Family Communication:  Admission, patients condition and plan of care including tests being ordered have been discussed with the patient who indicates understanding and agree with the plan and Code Status   Further plan will depend as patient's clinical course evolves and further radiologic and laboratory data become available.   Time Spent on Admission: 1 hour  Shafin Pollio Jenna LuoK Yaacov Koziol M.D. [facility] 07/06/2013, 11:27  PM Pager: [phone]  If 7PM-7AM, please contact night-coverage [email or web address] Password [email or web address]

## 2013-07-06 NOTE — ED Notes (Addendum)
Pt states hx of cardiac problems and has been out of his heart medicine ("It starts with a C") x 4 days.  C/o CP x 2 days.  States sometimes he has trouble swallowing. C/o vague, mild rt flank pain.  Pt also wants his penis checked because it hurts when he urinates and also states that it "burns when he dookies".

## 2013-07-06 NOTE — ED Provider Notes (Signed)
CSN: [id number]     Arrival date & time 07/06/13  1603 History   First MD Initiated Contact with Patient 07/06/13 1747     Chief Complaint  Patient presents with  . Chest Pain  . Hyperglycemia     (Consider location/radiation/quality/duration/timing/severity/associated sxs/prior Treatment) Patient is a 43 y.o. male presenting with chest pain and hyperglycemia. The history is provided by the patient.  Chest Pain Associated symptoms: abdominal pain and nausea   Associated symptoms: no back pain, no headache, no numbness, no shortness of breath, not vomiting and no weakness   Hyperglycemia Associated symptoms: abdominal pain, chest pain and nausea   Associated symptoms: no shortness of breath and no vomiting    patient presents with chest pain. He states his in his upper chest. It is been there for 3-4 days. It is dull in his mid chest. No fevers. No shortness of breath. No cough. He states he has been out of his cardiac medicine that starts with a C. for the last few days also. He states he does not have a doctor that he sees. He states his been taking the medicine. He also has had pain in his right flank. He states it is dull. He has had nausea vomiting. He states that He also has pain with urination. He states his been going more frequently. He states that on his penis under the skin there is some redness. He also has had some pain with bowel movements. No diarrhea or constipation. He states his been very thirsty.   Past Medical History  Diagnosis Date  . Hypertension   . Obesity   . Heart failure of unknown type 06/2011  . Thrombocytopenia   . Diabetes mellitus without complication    No past surgical history on file. No family history on file. History  Substance Use Topics  . Smoking status: Never Smoker   . Smokeless tobacco: Not on file  . Alcohol Use: No    Review of Systems  Constitutional: Negative for activity change and appetite change.  Eyes: Negative for pain.   Respiratory: Negative for chest tightness and shortness of breath.   Cardiovascular: Positive for chest pain. Negative for leg swelling.  Gastrointestinal: Positive for nausea and abdominal pain. Negative for vomiting and diarrhea.  Genitourinary: Positive for flank pain and penile pain. Negative for discharge, penile swelling and scrotal swelling.  Musculoskeletal: Negative for back pain and neck stiffness.  Skin: Negative for rash.  Neurological: Negative for weakness, numbness and headaches.  Psychiatric/Behavioral: Negative for behavioral problems.      Allergies  Review of patient's allergies indicates no known allergies.  Home Medications   No current outpatient prescriptions on file. BP 115/65  Pulse 102  Temp(Src) 97.6 F (36.4 C) (Oral)  Resp 18  SpO2 93% Physical Exam  Nursing note and vitals reviewed. Constitutional: He is oriented to person, place, and time. He appears well-developed and well-nourished.  HENT:  Head: Normocephalic and atraumatic.  Eyes: EOM are normal. Pupils are equal, round, and reactive to light.  Neck: Normal range of motion. Neck supple.  Cardiovascular: Regular rhythm and normal heart sounds.   No murmur heard. Mild tachycardia.  Pulmonary/Chest: Effort normal and breath sounds normal.  Abdominal: Soft. Bowel sounds are normal. He exhibits no distension and no mass. There is no tenderness. There is no rebound and no guarding.  Genitourinary:  Mild redness of head of penis. No discharge.  Musculoskeletal: Normal range of motion. He exhibits no edema.  Neurological: He  is alert and oriented to person, place, and time. No cranial nerve deficit.  Skin: Skin is warm and dry.  Psychiatric: He has a normal mood and affect.    ED Course  Procedures (including critical care time) Labs Review Labs Reviewed  CBC - Abnormal; Notable for the following:    Platelets 123 (*)    All other components within normal limits  BASIC METABOLIC PANEL -  Abnormal; Notable for the following:    Sodium 128 (*)    Chloride 89 (*)    Glucose, Bld 584 (*)    GFR calc non Af Amer 88 (*)    All other components within normal limits  URINALYSIS, ROUTINE W REFLEX MICROSCOPIC - Abnormal; Notable for the following:    Specific Gravity, Urine 1.034 (*)    Glucose, UA >1000 (*)    All other components within normal limits  URINE MICROSCOPIC-ADD ON - Abnormal; Notable for the following:    Casts HYALINE CASTS (*)    All other components within normal limits  CBG MONITORING, ED - Abnormal; Notable for the following:    Glucose-Capillary 479 (*)    All other components within normal limits  CBG MONITORING, ED - Abnormal; Notable for the following:    Glucose-Capillary 462 (*)    All other components within normal limits  CBG MONITORING, ED - Abnormal; Notable for the following:    Glucose-Capillary 415 (*)    All other components within normal limits  CBG MONITORING, ED - Abnormal; Notable for the following:    Glucose-Capillary 389 (*)    All other components within normal limits  GC/CHLAMYDIA PROBE AMP  I-STAT TROPOININ, ED   Imaging Review Dg Chest 2 View  07/06/2013   CLINICAL DATA:  Chest pain  EXAM: CHEST  2 VIEW  COMPARISON:  Prior radiograph from 10/23/2012  FINDINGS: Cardiomegaly is stable as compared to prior study. Mediastinal silhouette within normal limits.  The lungs are normally inflated. No airspace consolidation, pleural effusion, or pulmonary edema is identified. There is no pneumothorax.  No acute osseous abnormality identified.  IMPRESSION: Stable cardiomegaly with no acute cardiopulmonary abnormality identified.   Electronically Signed   By: Rise MuBenjamin  McClintock M.D.   On: 07/06/2013 19:16   Ct Head Wo Contrast  07/06/2013   CLINICAL DATA:  Headache and dizziness. Skull fracture 3 months ago.  EXAM: CT HEAD WITHOUT CONTRAST  TECHNIQUE: Contiguous axial images were obtained from the base of the skull through the vertex without  intravenous contrast.  COMPARISON:  04/04/2013  FINDINGS: Ventricles, cisterns and other CSF spaces are within normal. There is no mass, mass effect, shift of midline structures or acute hemorrhage. There is no evidence of acute infarction. There is evidence of patient's known left frontoparietal subacute/chronic skull fracture with mild interval healing. There is mucosal membrane thickening over the maxillary sinuses right worse than left.  IMPRESSION: No acute intracranial findings.  Healing subacute to chronic left frontoparietal skull fracture.  Chronic sinus inflammatory disease.   Electronically Signed   By: Elberta Fortisaniel  Boyle M.D.   On: 07/06/2013 18:41     EKG Interpretation   Date/Time:  Wednesday July 06 2013 16:09:27 EDT Ventricular Rate:  115 PR Interval:  153 QRS Duration: 102 QT Interval:  323 QTC Calculation: 447 R Axis:   -62 Text Interpretation:  Sinus tachycardia Left anterior fascicular block  Anteroseptal infarct, old SINCE LAST TRACING HEART RATE HAS INCREASED  Confirmed by Rubin PayorPICKERING  MD, Sudais Banghart [phone]([id number] on 07/06/2013 5:51:07 PM  Also  confirmed by Rubin Payor  MD, Harrold Donath [phone])  on 07/06/2013 6:49:55 PM      MDM   Final diagnoses:  Hyperglycemia  Chest pain    Patient multiple complaints. Headache after skull fracture 3 months ago. Discussed with Dr. Yetta Barre from neurosurgery. We scanned and no chronic subdural. Chest pain. EKG reassuring except for mild tachycardia. Has had previous history of CHF. Doubt pulmonary embolism. Also right-sided abdominal and chest pain. Urine does not show infection.  He is also hyperglycemic without DKA. He had 20 units of insulin and still had a sugar of around 450. Will admit to internal medicine.    Juliet Rude. Rubin Payor, MD 07/07/13 6045

## 2013-07-07 ENCOUNTER — Encounter (HOSPITAL_COMMUNITY): Payer: Self-pay

## 2013-07-07 DIAGNOSIS — E669 Obesity, unspecified: Secondary | ICD-10-CM

## 2013-07-07 DIAGNOSIS — I10 Essential (primary) hypertension: Secondary | ICD-10-CM

## 2013-07-07 DIAGNOSIS — I359 Nonrheumatic aortic valve disorder, unspecified: Secondary | ICD-10-CM

## 2013-07-07 LAB — GLUCOSE, CAPILLARY
GLUCOSE-CAPILLARY: 389 mg/dL — AB (ref 70–99)
GLUCOSE-CAPILLARY: 213 mg/dL — AB (ref 70–99)
GLUCOSE-CAPILLARY: 237 mg/dL — AB (ref 70–99)
Glucose-Capillary: 141 mg/dL — ABNORMAL HIGH (ref 70–99)
Glucose-Capillary: 198 mg/dL — ABNORMAL HIGH (ref 70–99)
Glucose-Capillary: 208 mg/dL — ABNORMAL HIGH (ref 70–99)
Glucose-Capillary: 232 mg/dL — ABNORMAL HIGH (ref 70–99)
Glucose-Capillary: 280 mg/dL — ABNORMAL HIGH (ref 70–99)
Glucose-Capillary: 305 mg/dL — ABNORMAL HIGH (ref 70–99)
Glucose-Capillary: 344 mg/dL — ABNORMAL HIGH (ref 70–99)
Glucose-Capillary: 356 mg/dL — ABNORMAL HIGH (ref 70–99)
Glucose-Capillary: 356 mg/dL — ABNORMAL HIGH (ref 70–99)

## 2013-07-07 LAB — CBC
HCT: 35.6 % — ABNORMAL LOW (ref 39.0–52.0)
Hemoglobin: 12.3 g/dL — ABNORMAL LOW (ref 13.0–17.0)
MCH: 30 pg (ref 26.0–34.0)
MCHC: 34.6 g/dL (ref 30.0–36.0)
MCV: 86.8 fL (ref 78.0–100.0)
PLATELETS: 82 10*3/uL — AB (ref 150–400)
RBC: 4.1 MIL/uL — AB (ref 4.22–5.81)
RDW: 12.9 % (ref 11.5–15.5)
WBC: 6.3 10*3/uL (ref 4.0–10.5)

## 2013-07-07 LAB — PRO B NATRIURETIC PEPTIDE: PRO B NATRI PEPTIDE: 8.7 pg/mL (ref 0–125)

## 2013-07-07 LAB — TSH: TSH: 2.65 u[IU]/mL (ref 0.350–4.500)

## 2013-07-07 LAB — BASIC METABOLIC PANEL
BUN: 16 mg/dL (ref 6–23)
CALCIUM: 8.7 mg/dL (ref 8.4–10.5)
CHLORIDE: 98 meq/L (ref 96–112)
CO2: 23 mEq/L (ref 19–32)
CREATININE: 0.88 mg/dL (ref 0.50–1.35)
Glucose, Bld: 385 mg/dL — ABNORMAL HIGH (ref 70–99)
Potassium: 3.8 mEq/L (ref 3.7–5.3)
Sodium: 135 mEq/L — ABNORMAL LOW (ref 137–147)

## 2013-07-07 LAB — HEMOGLOBIN A1C
HEMOGLOBIN A1C: 14.4 % — AB (ref <5.7)
MEAN PLASMA GLUCOSE: 367 mg/dL — AB (ref <117)

## 2013-07-07 LAB — TROPONIN I

## 2013-07-07 MED ORDER — SPIRONOLACTONE 25 MG PO TABS
25.0000 mg | ORAL_TABLET | Freq: Every day | ORAL | Status: DC
  Administered 2013-07-07 – 2013-07-08 (×2): 25 mg via ORAL
  Filled 2013-07-07 (×2): qty 1

## 2013-07-07 MED ORDER — PANTOPRAZOLE SODIUM 40 MG PO TBEC
40.0000 mg | DELAYED_RELEASE_TABLET | Freq: Every day | ORAL | Status: DC
  Administered 2013-07-07 – 2013-07-08 (×2): 40 mg via ORAL
  Filled 2013-07-07 (×4): qty 1

## 2013-07-07 MED ORDER — INSULIN GLARGINE 100 UNIT/ML ~~LOC~~ SOLN: 30.0000 [IU] | Freq: Every day | SUBCUTANEOUS | Status: DC

## 2013-07-07 MED ORDER — METFORMIN HCL 1000 MG PO TABS: 1000.0000 mg | ORAL_TABLET | Freq: Two times a day (BID) | ORAL | Status: DC

## 2013-07-07 MED ORDER — LISINOPRIL 20 MG PO TABS
20.0000 mg | ORAL_TABLET | Freq: Every day | ORAL | Status: DC
  Administered 2013-07-07 – 2013-07-08 (×2): 20 mg via ORAL
  Filled 2013-07-07 (×2): qty 1

## 2013-07-07 MED ORDER — BD GETTING STARTED TAKE HOME KIT: 1ML X 30 G SYRINGES,
1.0000 | Freq: Once | Status: AC
Start: 2013-07-07 — End: 2013-07-07
  Administered 2013-07-07: 1
  Filled 2013-07-07: qty 1

## 2013-07-07 MED ORDER — CARVEDILOL 12.5 MG PO TABS
12.5000 mg | ORAL_TABLET | Freq: Two times a day (BID) | ORAL | Status: DC
  Administered 2013-07-07 – 2013-07-08 (×4): 12.5 mg via ORAL
  Filled 2013-07-07 (×6): qty 1

## 2013-07-07 MED ORDER — INSULIN GLARGINE 100 UNIT/ML ~~LOC~~ SOLN
10.0000 [IU] | Freq: Every day | SUBCUTANEOUS | Status: DC
  Filled 2013-07-07: qty 0.1

## 2013-07-07 MED ORDER — ACETAMINOPHEN 325 MG PO TABS: 650.0000 mg | ORAL_TABLET | Freq: Four times a day (QID) | ORAL | Status: DC | PRN

## 2013-07-07 MED ORDER — INSULIN ASPART 100 UNIT/ML ~~LOC~~ SOLN
0.0000 [IU] | Freq: Three times a day (TID) | SUBCUTANEOUS | Status: DC
  Administered 2013-07-07 (×2): 11 [IU] via SUBCUTANEOUS
  Administered 2013-07-08: 8 [IU] via SUBCUTANEOUS

## 2013-07-07 MED ORDER — INSULIN GLARGINE 100 UNIT/ML ~~LOC~~ SOLN
10.0000 [IU] | Freq: Every day | SUBCUTANEOUS | Status: DC
  Administered 2013-07-07: 10 [IU] via SUBCUTANEOUS
  Filled 2013-07-07 (×2): qty 0.1

## 2013-07-07 MED ORDER — ACETAMINOPHEN 650 MG RE SUPP: 650.0000 mg | Freq: Four times a day (QID) | RECTAL | Status: DC | PRN

## 2013-07-07 MED ORDER — LIVING WELL WITH DIABETES BOOK
Freq: Once | Status: AC
  Administered 2013-07-07: 1
  Filled 2013-07-07: qty 1

## 2013-07-07 MED ORDER — INSULIN NPH ISOPHANE & REGULAR (70-30) 100 UNIT/ML ~~LOC~~ SUSP: 35.0000 [IU] | Freq: Every day | SUBCUTANEOUS | Status: DC

## 2013-07-07 MED ORDER — INSULIN NPH ISOPHANE & REGULAR (70-30) 100 UNIT/ML ~~LOC~~ SUSP: 10.0000 [IU] | Freq: Every day | SUBCUTANEOUS | Status: DC

## 2013-07-07 MED ORDER — INSULIN REGULAR BOLUS VIA INFUSION
0.0000 [IU] | Freq: Three times a day (TID) | INTRAVENOUS | Status: DC
  Administered 2013-07-07: 3.8 [IU] via INTRAVENOUS
  Filled 2013-07-07: qty 10

## 2013-07-07 MED ORDER — SODIUM CHLORIDE 0.9 % IV SOLN
INTRAVENOUS | Status: DC
  Administered 2013-07-07 (×2): via INTRAVENOUS

## 2013-07-07 MED ORDER — INSULIN ASPART 100 UNIT/ML ~~LOC~~ SOLN
0.0000 [IU] | Freq: Every day | SUBCUTANEOUS | Status: DC
  Administered 2013-07-07: 2 [IU] via SUBCUTANEOUS

## 2013-07-07 MED ORDER — ASPIRIN EC 325 MG PO TBEC
325.0000 mg | DELAYED_RELEASE_TABLET | Freq: Every day | ORAL | Status: DC
  Administered 2013-07-07 – 2013-07-08 (×2): 325 mg via ORAL
  Filled 2013-07-07 (×2): qty 1

## 2013-07-07 MED ORDER — GLIPIZIDE 10 MG PO TABS
10.0000 mg | ORAL_TABLET | Freq: Two times a day (BID) | ORAL | Status: DC
  Administered 2013-07-07 – 2013-07-08 (×2): 10 mg via ORAL
  Filled 2013-07-07 (×4): qty 1

## 2013-07-07 MED ORDER — ONDANSETRON HCL 4 MG PO TABS: 4.0000 mg | ORAL_TABLET | Freq: Four times a day (QID) | ORAL | Status: DC | PRN

## 2013-07-07 MED ORDER — INSULIN ASPART 100 UNIT/ML ~~LOC~~ SOLN
3.0000 [IU] | Freq: Three times a day (TID) | SUBCUTANEOUS | Status: DC
  Administered 2013-07-07 – 2013-07-08 (×3): 3 [IU] via SUBCUTANEOUS

## 2013-07-07 MED ORDER — GLIPIZIDE 5 MG PO TABS
5.0000 mg | ORAL_TABLET | Freq: Two times a day (BID) | ORAL | Status: DC
  Administered 2013-07-07: 5 mg via ORAL
  Filled 2013-07-07 (×3): qty 1

## 2013-07-07 MED ORDER — SODIUM CHLORIDE 0.9 % IV SOLN
INTRAVENOUS | Status: AC
Start: 2013-07-07 — End: 2013-07-07

## 2013-07-07 MED ORDER — SODIUM CHLORIDE 0.9 % IV SOLN
INTRAVENOUS | Status: DC
  Administered 2013-07-07: 3 [IU]/h via INTRAVENOUS
  Filled 2013-07-07: qty 1

## 2013-07-07 MED ORDER — CARVEDILOL 12.5 MG PO TABS: 12.5000 mg | ORAL_TABLET | Freq: Two times a day (BID) | ORAL | Status: DC

## 2013-07-07 MED ORDER — HYDROMORPHONE HCL PF 1 MG/ML IJ SOLN: 1.0000 mg | INTRAMUSCULAR | Status: DC | PRN

## 2013-07-07 MED ORDER — INSULIN GLARGINE 100 UNIT/ML ~~LOC~~ SOLN: 5.0000 [IU] | Freq: Every day | SUBCUTANEOUS | Status: DC

## 2013-07-07 MED ORDER — DEXTROSE 50 % IV SOLN: 25.0000 mL | INTRAVENOUS | Status: DC | PRN

## 2013-07-07 MED ORDER — HYDROCODONE-ACETAMINOPHEN 5-325 MG PO TABS: 1.0000 | ORAL_TABLET | ORAL | Status: DC | PRN

## 2013-07-07 MED ORDER — GLIPIZIDE 5 MG PO TABS: 5.0000 mg | ORAL_TABLET | Freq: Two times a day (BID) | ORAL | Status: DC

## 2013-07-07 MED ORDER — SPIRONOLACTONE 25 MG PO TABS: 25.0000 mg | ORAL_TABLET | Freq: Every day | ORAL | Status: DC

## 2013-07-07 MED ORDER — ENOXAPARIN SODIUM 40 MG/0.4ML ~~LOC~~ SOLN
40.0000 mg | SUBCUTANEOUS | Status: DC
  Administered 2013-07-07 – 2013-07-08 (×2): 40 mg via SUBCUTANEOUS
  Filled 2013-07-07 (×2): qty 0.4

## 2013-07-07 MED ORDER — SODIUM CHLORIDE 0.9 % IJ SOLN
3.0000 mL | Freq: Two times a day (BID) | INTRAMUSCULAR | Status: DC
  Administered 2013-07-07 – 2013-07-08 (×2): 3 mL via INTRAVENOUS

## 2013-07-07 MED ORDER — LISINOPRIL 20 MG PO TABS: 20.0000 mg | ORAL_TABLET | Freq: Every day | ORAL | Status: DC

## 2013-07-07 MED ORDER — METFORMIN HCL 500 MG PO TABS
1000.0000 mg | ORAL_TABLET | Freq: Two times a day (BID) | ORAL | Status: DC
  Administered 2013-07-07 – 2013-07-08 (×3): 1000 mg via ORAL
  Filled 2013-07-07 (×5): qty 2

## 2013-07-07 MED ORDER — INSULIN GLARGINE 100 UNIT/ML ~~LOC~~ SOLN
30.0000 [IU] | Freq: Every day | SUBCUTANEOUS | Status: DC
  Administered 2013-07-07: 30 [IU] via SUBCUTANEOUS
  Filled 2013-07-07 (×2): qty 0.3

## 2013-07-07 MED ORDER — DEXTROSE-NACL 5-0.45 % IV SOLN: INTRAVENOUS | Status: DC

## 2013-07-07 MED ORDER — ONDANSETRON HCL 4 MG/2ML IJ SOLN: 4.0000 mg | Freq: Four times a day (QID) | INTRAMUSCULAR | Status: DC | PRN

## 2013-07-07 NOTE — Progress Notes (Addendum)
Inpatient Diabetes Program Recommendations  AACE/ADA: New Consensus Statement on Inpatient Glycemic Control (2013)  Target Ranges:  Prepandial:   less than 140 mg/dL      Peak postprandial:   less than 180 mg/dL (1-2 hours)      Critically ill patients:  140 - 180 mg/dL  Results for Dave Arnold, Breton R (MRN [id number]) as of 07/07/2013 11:55  Ref. Range 07/07/2013 04:05 07/07/2013 05:06 07/07/2013 06:05 07/07/2013 07:02 07/07/2013 08:04  Glucose-Capillary Latest Range: 70-99 mg/dL 086344 (H) 578280 (H) 469213 (H) 141 (H) 198 (H)   Inpatient Diabetes Program Recommendations HgbA1C: pending results Consult received. Basic inpatient DM education has been ordered to begin by bedside RN.  Videos and Living Well with Diabetes manual has been ordered.   At this time only PO DM meds are ordered for discharge. Patient is to [facility].   ADDENDUM: this coordinator spoke with Dr. David StallFeliz Ortiz concerning glucose at noon >300.  Discharge has been cancelled bc patient will need insulin at discharge.  Insulin education needs to be completed and dose titrated prior to discharge.   Will follow. Thank you  Piedad ClimesGina Vietta Bonifield BSN, RN,CDE Inpatient Diabetes Coordinator [phone] (team pager)

## 2013-07-07 NOTE — Discharge Summary (Addendum)
Physician Discharge Summary  LAMOYNE HESSEL ZOX:[id number] DOB: 1970-09-09 DOA: 07/06/2013  PCP: Default, Provider, MD  Admit date: 07/06/2013 Discharge date: 07/08/2013  Time spent: 35 minutes  Recommendations for Outpatient Follow-up:  1. Follow up at wellness center in 2-4 weeks. 2. Follow up with cardiology in 1 months  Discharge Diagnoses:  Principal Problem:   Hyperglycemia Active Problems:   Hypertension   Hyperosmolar syndrome   Atypical chest pain   Discharge Condition: Stable  Diet recommendation: carb modified  Filed Weights   07/07/13 0030  Weight: 132.3 kg (291 lb 10.7 oz)    History of present illness:  43 year old male with history of diabetes mellitus, uncontrolled, hypertension, CHF presented to the ER with multiple complaints. Patient reports that he's been having difficulty controlling his blood sugars, having polyuria and polydipsia for last several days, going more frequently and having nausea, pain when his right flank. He also described some shortness of breath and pain in upper chest, at times feels like acid reflux. He does not have any primary care physician for his regular care.    Hospital Course:  Hyperglycemia with hyperosmolar diabetes mellitus: - due to non compliance. - Placed on IV insulin glucose stabilizer, transition to metformin max dose and glipizide 10 mg BID. - Home on insulin 70/30.  BID - Hemoglobin A1c 14.4  Hypertension: - Continue Coreg and lisinopril   Atypical chest pain: - Does not appear to be cardiac, however obtain 3 sets of cardiac enzymes negative x3. - 2-D echo pending   Procedures:  Echo  Consultations:  none  Discharge Exam: Filed Vitals:   07/08/13 0831  BP: 134/74  Pulse: 71  Temp: 96.3 F (35.7 C)  Resp: 20    General: A&O x3 Cardiovascular: RRR Respiratory: good air movement CTA B/L  Discharge Instructions You were cared for by a hospitalist during your hospital stay. If you have any  questions about your discharge medications or the care you received while you were in the hospital after you are discharged, you can call the unit and asked to speak with the hospitalist on call if the hospitalist that took care of you is not available. Once you are discharged, your primary care physician will handle any further medical issues. Please note that NO REFILLS for any discharge medications will be authorized once you are discharged, as it is imperative that you return to your primary care physician (or establish a relationship with a primary care physician if you do not have one) for your aftercare needs so that they can reassess your need for medications and monitor your lab values.      Discharge Orders   Future Appointments Provider Department Dept Phone   08/15/2013 9:00 AM Doris Cheadle, MD [facility] [phone]   Future Orders Complete By Expires   Diet - low sodium heart healthy  As directed    Diet - low sodium heart healthy  As directed    Increase activity slowly  As directed    Increase activity slowly  As directed        Medication List         aspirin EC 325 MG tablet  Take 325 mg by mouth daily. Takes once to twice per day.     carvedilol 12.5 MG tablet  Commonly known as:  COREG  Take 1 tablet (12.5 mg total) by mouth 2 (two) times daily with a meal.     glipiZIDE 10 MG tablet  Commonly  known as:  GLUCOTROL  Take 1 tablet (10 mg total) by mouth 2 (two) times daily before a meal.     insulin NPH-regular Human (70-30) 100 UNIT/ML injection  Commonly known as:  NOVOLIN 70/30 RELION  Inject 45 Units into the skin daily with breakfast.     insulin NPH-regular Human (70-30) 100 UNIT/ML injection  Commonly known as:  NOVOLIN 70/30 RELION  Inject 20 Units into the skin daily with supper.     lisinopril 20 MG tablet  Commonly known as:  PRINIVIL,ZESTRIL  Take 1 tablet (20 mg total) by mouth daily.     metFORMIN 1000 MG tablet   Commonly known as:  GLUCOPHAGE  Take 1 tablet (1,000 mg total) by mouth 2 (two) times daily with a meal.     spironolactone 25 MG tablet  Commonly known as:  ALDACTONE  Take 1 tablet (25 mg total) by mouth daily.       No Known Allergies Follow-up Information   Follow up with [facility]     On 08/15/2013. (Dr. Webb Silversmith visit/meds in bottle/photo id)    Contact information:   [address] [place] [address] [phone]       The results of significant diagnostics from this hospitalization (including imaging, microbiology, ancillary and laboratory) are listed below for reference.    Significant Diagnostic Studies: Dg Chest 2 View  07/06/2013   CLINICAL DATA:  Chest pain  EXAM: CHEST  2 VIEW  COMPARISON:  Prior radiograph from 10/23/2012  FINDINGS: Cardiomegaly is stable as compared to prior study. Mediastinal silhouette within normal limits.  The lungs are normally inflated. No airspace consolidation, pleural effusion, or pulmonary edema is identified. There is no pneumothorax.  No acute osseous abnormality identified.  IMPRESSION: Stable cardiomegaly with no acute cardiopulmonary abnormality identified.   Electronically Signed   By: Rise Mu M.D.   On: 07/06/2013 19:16   Ct Head Wo Contrast  07/06/2013   CLINICAL DATA:  Headache and dizziness. Skull fracture 3 months ago.  EXAM: CT HEAD WITHOUT CONTRAST  TECHNIQUE: Contiguous axial images were obtained from the base of the skull through the vertex without intravenous contrast.  COMPARISON:  04/04/2013  FINDINGS: Ventricles, cisterns and other CSF spaces are within normal. There is no mass, mass effect, shift of midline structures or acute hemorrhage. There is no evidence of acute infarction. There is evidence of patient's known left frontoparietal subacute/chronic skull fracture with mild interval healing. There is mucosal membrane thickening over the maxillary sinuses right worse  than left.  IMPRESSION: No acute intracranial findings.  Healing subacute to chronic left frontoparietal skull fracture.  Chronic sinus inflammatory disease.   Electronically Signed   By: Elberta Fortis M.D.   On: 07/06/2013 18:41    Microbiology: No results found for this or any previous visit (from the past 240 hour(s)).   Labs: Basic Metabolic Panel:  Recent Labs Lab 07/06/13 1626 07/07/13 0223  NA 128* 135*  K 4.6 3.8  CL 89* 98  CO2 21 23  GLUCOSE 584* 385*  BUN 17 16  CREATININE 1.04 0.88  CALCIUM 9.7 8.7   Liver Function Tests: No results found for this basename: AST, ALT, ALKPHOS, BILITOT, PROT, ALBUMIN,  in the last 168 hours No results found for this basename: LIPASE, AMYLASE,  in the last 168 hours No results found for this basename: AMMONIA,  in the last 168 hours CBC:  Recent Labs Lab 07/06/13 1626 07/07/13 0223  WBC 9.5 6.3  HGB 14.7 12.3*  HCT 41.1 35.6*  MCV 85.4 86.8  PLT 123* 82*   Cardiac Enzymes:  Recent Labs Lab 07/07/13 0020 07/07/13 0627 07/07/13 1216  TROPONINI <0.30 <0.30 <0.30   BNP: BNP (last 3 results)  Recent Labs  07/07/13 0100  PROBNP 8.7   CBG:  Recent Labs Lab 07/07/13 1153 07/07/13 1600 07/07/13 1803 07/07/13 2101 07/08/13 0754  GLUCAP 305* 237* 232* 208* 286*   Signed:  Marinda ElkAbraham Feliz Ortiz  [facility] 07/08/2013, 10:29 AM

## 2013-07-07 NOTE — Care Management Note (Addendum)
    Page 1 of 1   07/08/2013     1:48:54 PM   CARE MANAGEMENT NOTE 07/08/2013  Patient:  Dave Arnold,Dave Arnold   Account Number:  [id number]  Date Initiated:  07/07/2013  Documentation initiated by:  Lanier ClamMAHABIR,Philena Obey  Subjective/Objective Assessment:   42 Y/O M ADMITTED W/HYPERGLYCEMIA.     Action/Plan:   FROM HOME.   Anticipated DC Date:  07/08/2013   Anticipated DC Plan:  HOME/SELF CARE  In-house referral  Financial Counselor  PCP / [facility]      DC Planning Services  CM consult  [facility]igent [facility]  Medication Assistance      Choice offered to / List presented to:             Status of service:  Completed, signed off Medicare Important Message given?   (If response is "NO", the following Medicare IM given date fields will be blank) Date Medicare IM given:   Date Additional Medicare IM given:    Discharge Disposition:  HOME/SELF CARE  Per UR Regulation:  Reviewed for med. necessity/level of care/duration of stay  If discussed at Long Length of Stay Meetings, dates discussed:    Comments:  07/08/13 Dave Frontera RN,BSN NCM [phone] D/C HOME.SEEN BY DM ED-INSULIN INSTRUCTION,TEACH BACK.PATIENT VOICED UNDERSTANDING ABOUT GOING TO [facility] @ D/C TODAY W/SCRIPTS TO SEE THE PHARMACY TO FILL SCRIPTS,TELL THEM HE IS JUST D/C FROM HOSPITAL & RNCM SENT HIM.[facility] IS [facility]WARE-SPOKE TO [facility].  07/07/13 Dave Linck RN,BSN NCM [phone] [facility] APPT SET 5/18-9A,SEE D/C,F/U SECTION.PATIENT WILL GO TO CLINIC TO PHARMACY W/SCRIPTS FOR MEDS TO BE FILLED.

## 2013-07-07 NOTE — Progress Notes (Signed)
Pt states he has been non-compliant because he does not have the money to pay for his medications, and is requesting assistance from any available resources. SRP, RN

## 2013-07-07 NOTE — Plan of Care (Signed)
Problem: Food- and Nutrition-Related Knowledge Deficit (NB-1.1) Goal: Nutrition education Formal process to instruct or train a patient/client in a skill or to impart knowledge to help patients/clients voluntarily manage or modify food choices and eating behavior to maintain or improve health. Outcome: Completed/Met Date Met:  07/07/13  RD consulted for nutrition education regarding diabetes.     Lab Results  Component Value Date    HGBA1C 5.5 07/19/2011  -UPDATED HGBA1C PENDING  RD provided "Carbohydrate Counting for People with Diabetes" handout from the Academy of Nutrition and Dietetics. Discussed different food groups and their effects on blood sugar, emphasizing carbohydrate-containing foods. Provided list of carbohydrates and recommended serving sizes of common foods.  Discussed importance of controlled and consistent carbohydrate intake throughout the day. Provided examples of ways to balance meals/snacks and encouraged intake of high-fiber, whole grain complex carbohydrates. Teach back method used.  Expect fair compliance. Pt was interested in changing diet to assist in controlling blood glucose; had already made goal to decrease intake of juice and soda before RD diet education. Diet recall indicates pt consuming large amount of Gatorade, highly processed breakfast sandwiches and fast food. Recommended pt limit intake of fast food, or opt for healthier fast food choices and be knowledgeable about nutrition facts of fast food items. Encouraged pt to consume water/unsweetened beverages, increase intake of fiber/fruits/vegetables, and to practice portion size control and balanced meals. Assisted pt in ordering lunch, promoted intake of non-starchy vegetables and heart healthy proteins. Would benefit from additional RD follow up and reinforecments  Body mass index is 39.55 kg/(m^2). 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  Current diet order is Carb Modified, patient is  consuming approximately 75% of meals at this time. Labs and medications reviewed. No further nutrition interventions warranted at this time. RD contact information provided. If additional nutrition issues arise, please re-consult RD.  Atlee Abide MS RD LDN Clinical Dietitian [phone]

## 2013-07-07 NOTE — Progress Notes (Signed)
OT Cancellation Note/Screen  Patient Details Name: Dolores PattyKevin R Keithly MRN: [id number] DOB: 10-02-1970   Cancelled Treatment:    Reason Eval/Treat Not Completed: Other (comment)  Pt feels independent with adls.  Just returned from bathroom.  Does not have any OT needs.  Will sign off.   Marica OtterMaryellen Aymen Widrig 07/07/2013, 8:31 AM Marica OtterMaryellen Tyrianna Lightle, OTR/L [phone] 07/07/2013

## 2013-07-07 NOTE — Progress Notes (Signed)
[facility] PROGRESS NOTE Assessment/Plan: Hyperosmolar syndrome: - IV insulin drip, transition to SQ insulin. - CBG's AC HS. - Restart metformin and glipizide, start lantus plus SSI.  Atypical chest pain: - Cardiac markers negative x 2. - Echo pending.  Hypertension - stable.    Code Status: full Family Communication: none  Disposition Plan: inpatient   Consultants:  none  Procedures:  CT head  CXR  Antibiotics:  none  HPI/Subjective: Feels better  Objective: Filed Vitals:   07/06/13 2028 07/06/13 2301 07/07/13 0030 07/07/13 0523  BP:  115/65 115/68 111/71  Pulse:  102 87 74  Temp: 97.4 F (36.3 C) 97.6 F (36.4 C) 98.6 F (37 C) 97.9 F (36.6 C)  TempSrc:  Oral Oral Oral  Resp:  18 20 20   Height:   6' (1.829 m)   Weight:   132.3 kg (291 lb 10.7 oz)   SpO2:  93% 95% 95%    Intake/Output Summary (Last 24 hours) at 07/07/13 0831 Last data filed at 07/07/13 0600  Gross per 24 hour  Intake 483.33 ml  Output      0 ml  Net 483.33 ml   Filed Weights   07/07/13 0030  Weight: 132.3 kg (291 lb 10.7 oz)    Exam:  General: Alert, awake, oriented x3, in no acute distress.  HEENT: No bruits, no goiter.  Heart: Regular rate and rhythm, without murmurs, rubs, gallops.  Lungs: Good air movement, clear Abdomen: Soft, nontender, nondistended, positive bowel sounds.     Data Reviewed: Basic Metabolic Panel:  Recent Labs Lab 07/06/13 1626 07/07/13 0223  NA 128* 135*  K 4.6 3.8  CL 89* 98  CO2 21 23  GLUCOSE 584* 385*  BUN 17 16  CREATININE 1.04 0.88  CALCIUM 9.7 8.7   Liver Function Tests: No results found for this basename: AST, ALT, ALKPHOS, BILITOT, PROT, ALBUMIN,  in the last 168 hours No results found for this basename: LIPASE, AMYLASE,  in the last 168 hours No results found for this basename: AMMONIA,  in the last 168 hours CBC:  Recent Labs Lab 07/06/13 1626 07/07/13 0223  WBC 9.5 6.3  HGB 14.7 12.3*  HCT 41.1  35.6*  MCV 85.4 86.8  PLT 123* 82*   Cardiac Enzymes:  Recent Labs Lab 07/07/13 0020 07/07/13 0627  TROPONINI <0.30 <0.30   BNP (last 3 results)  Recent Labs  07/07/13 0100  PROBNP 8.7   CBG:  Recent Labs Lab 07/07/13 0304 07/07/13 0405 07/07/13 0506 07/07/13 0605 07/07/13 0702  GLUCAP 389* 344* 280* 213* 141*    No results found for this or any previous visit (from the past 240 hour(s)).   Studies: Dg Chest 2 View  07/06/2013   CLINICAL DATA:  Chest pain  EXAM: CHEST  2 VIEW  COMPARISON:  Prior radiograph from 10/23/2012  FINDINGS: Cardiomegaly is stable as compared to prior study. Mediastinal silhouette within normal limits.  The lungs are normally inflated. No airspace consolidation, pleural effusion, or pulmonary edema is identified. There is no pneumothorax.  No acute osseous abnormality identified.  IMPRESSION: Stable cardiomegaly with no acute cardiopulmonary abnormality identified.   Electronically Signed   By: Rise MuBenjamin  McClintock M.D.   On: 07/06/2013 19:16   Ct Head Wo Contrast  07/06/2013   CLINICAL DATA:  Headache and dizziness. Skull fracture 3 months ago.  EXAM: CT HEAD WITHOUT CONTRAST  TECHNIQUE: Contiguous axial images were obtained from the base of the skull through the vertex without intravenous  contrast.  COMPARISON:  04/04/2013  FINDINGS: Ventricles, cisterns and other CSF spaces are within normal. There is no mass, mass effect, shift of midline structures or acute hemorrhage. There is no evidence of acute infarction. There is evidence of patient's known left frontoparietal subacute/chronic skull fracture with mild interval healing. There is mucosal membrane thickening over the maxillary sinuses right worse than left.  IMPRESSION: No acute intracranial findings.  Healing subacute to chronic left frontoparietal skull fracture.  Chronic sinus inflammatory disease.   Electronically Signed   By: Elberta Fortis M.D.   On: 07/06/2013 18:41    Scheduled Meds: .  sodium chloride   Intravenous STAT  . aspirin EC  325 mg Oral Daily  . carvedilol  12.5 mg Oral BID WC  . enoxaparin (LOVENOX) injection  40 mg Subcutaneous Q24H  . insulin regular  0-10 Units Intravenous TID WC  . lisinopril  20 mg Oral Daily  . pantoprazole  40 mg Oral Q0600  . sodium chloride  3 mL Intravenous Q12H   Continuous Infusions: . sodium chloride 100 mL/hr at 07/07/13 0811  . dextrose 5 % and 0.45% NaCl    . insulin (NOVOLIN-R) infusion 9.7 Units/hr (07/07/13 0810)     Marinda Elk  [facility] Pager [phone]. If 8PM-8AM, please contact night-coverage at [email or web address], password [facility] 07/07/2013, 8:31 AM  LOS: 1 day

## 2013-07-07 NOTE — Progress Notes (Signed)
UR completed 

## 2013-07-07 NOTE — Progress Notes (Signed)
Echocardiogram 2D Echocardiogram has been performed.  Genene ChurnJames M Everlee Quakenbush 07/07/2013, 2:32 PM

## 2013-07-07 NOTE — Progress Notes (Signed)
Patient refused to learn how to draw up his own insulin, stated "my girlfriend is going to do it for me."  Educated on the importance of learning to do it himself and also offered to come back in 30 mins but patient still refused.  Asked if he could just wait until tomorrow.  I did explain and demonstrated the process but it was difficult to keep the patient's attention.  Will pass information to night nurse to try maybe at bedtime.

## 2013-07-08 DIAGNOSIS — D696 Thrombocytopenia, unspecified: Secondary | ICD-10-CM

## 2013-07-08 LAB — GC/CHLAMYDIA PROBE AMP
CT Probe RNA: NEGATIVE
GC Probe RNA: NEGATIVE

## 2013-07-08 LAB — GLUCOSE, CAPILLARY: Glucose-Capillary: 286 mg/dL — ABNORMAL HIGH (ref 70–99)

## 2013-07-08 MED ORDER — GLIPIZIDE 10 MG PO TABS: 10.0000 mg | ORAL_TABLET | Freq: Two times a day (BID) | ORAL | Status: DC

## 2013-07-08 MED ORDER — INSULIN NPH ISOPHANE & REGULAR (70-30) 100 UNIT/ML ~~LOC~~ SUSP: 20.0000 [IU] | Freq: Every day | SUBCUTANEOUS | Status: DC

## 2013-07-08 MED ORDER — INSULIN GLARGINE 100 UNIT/ML ~~LOC~~ SOLN
30.0000 [IU] | Freq: Every day | SUBCUTANEOUS | Status: DC
  Administered 2013-07-08: 30 [IU] via SUBCUTANEOUS
  Filled 2013-07-08: qty 0.3

## 2013-07-08 MED ORDER — METFORMIN HCL 1000 MG PO TABS: 1000.0000 mg | ORAL_TABLET | Freq: Two times a day (BID) | ORAL | Status: DC

## 2013-07-08 MED ORDER — INSULIN NPH ISOPHANE & REGULAR (70-30) 100 UNIT/ML ~~LOC~~ SUSP: 45.0000 [IU] | Freq: Every day | SUBCUTANEOUS | Status: DC

## 2013-07-08 NOTE — Progress Notes (Signed)
Patient was able to draw up and administer his own insulin this morning.  Went over the steps using teach back and patient did very well.  No further questions at this time.

## 2013-07-08 NOTE — Progress Notes (Addendum)
Inpatient Diabetes Program Recommendations  AACE/ADA: New Consensus Statement on Inpatient Glycemic Control (2013)  Target Ranges:  Prepandial:   less than 140 mg/dL      Peak postprandial:   less than 180 mg/dL (1-2 hours)      Critically ill patients:  140 - 180 mg/dL  Results for Dave Arnold, Dave Arnold (MRN [id number]) as of 07/08/2013 08:05  Ref. Range 07/07/2013 00:20  Hemoglobin A1C Latest Range: <5.7 % 14.4 (H)   Late entry: This coordinator met with patient last evening at approximately 3:30 to discuss diabetes diagnosis and need for insulin at home.  Patient states he was never actually told he has diabetes but was given Metformin to take and no further explanation except his "sugar" was a little high.  Historical information shows visit to Urgent Care/ED 10/23/2012 and new diagnosis DM at that time.  Patient was supposed to [facility] following week but patient reports he could not get an appointment.  Patient is being treated as a new-onset at this hospitalization because he never had any education.  DM videos were ordered and patient did begin viewing them.  Patient still seems to be in denial that he has DM and will require insulin at home.  A1C indicates at this time insulin would be the best intervention.  Explained to patient that lifestyle changes through diet and exercise may allow him to stop insulin in the future and use PO meds but at the present time he needs insulin.  Pt seems to understand but is not happy about it.  Bedside RN will be working with patient on insulin self administration.  Insulin education starter-kit was ordered for patient to take home. ADD: spoke with patient and girlfriend this morning.  Patient has given his own insulin and feels more accepting of the need for insulin.  Patient did not realize that fruit juices have a heavy carb load.  We discussed that fruit juices were not the best choice for patient's with DM unless they are  experiencing a hypoglycemic episode.  We did discuss having a small amount of juice with meals but the carb content must be included in his total carb count for that meal.  No further questions/concerns at this time.   Thank you  Raoul Pitch BSN, RN,CDE Inpatient Diabetes Coordinator [phone] (team pager)

## 2013-08-15 ENCOUNTER — Encounter: Payer: Self-pay | Admitting: Internal Medicine

## 2013-08-15 ENCOUNTER — Ambulatory Visit: Payer: Self-pay | Attending: Internal Medicine | Admitting: Internal Medicine

## 2013-08-15 VITALS — BP 129/78 | HR 70 | Temp 99.0°F | Resp 16 | Wt 301.2 lb

## 2013-08-15 DIAGNOSIS — E119 Type 2 diabetes mellitus without complications: Secondary | ICD-10-CM | POA: Insufficient documentation

## 2013-08-15 DIAGNOSIS — Z888 Allergy status to other drugs, medicaments and biological substances status: Secondary | ICD-10-CM | POA: Insufficient documentation

## 2013-08-15 DIAGNOSIS — D696 Thrombocytopenia, unspecified: Secondary | ICD-10-CM | POA: Insufficient documentation

## 2013-08-15 DIAGNOSIS — I10 Essential (primary) hypertension: Secondary | ICD-10-CM | POA: Insufficient documentation

## 2013-08-15 DIAGNOSIS — Z794 Long term (current) use of insulin: Secondary | ICD-10-CM | POA: Insufficient documentation

## 2013-08-15 DIAGNOSIS — H538 Other visual disturbances: Secondary | ICD-10-CM | POA: Insufficient documentation

## 2013-08-15 DIAGNOSIS — Z79899 Other long term (current) drug therapy: Secondary | ICD-10-CM | POA: Insufficient documentation

## 2013-08-15 DIAGNOSIS — E669 Obesity, unspecified: Secondary | ICD-10-CM | POA: Insufficient documentation

## 2013-08-15 DIAGNOSIS — I509 Heart failure, unspecified: Secondary | ICD-10-CM | POA: Insufficient documentation

## 2013-08-15 DIAGNOSIS — Z139 Encounter for screening, unspecified: Secondary | ICD-10-CM

## 2013-08-15 DIAGNOSIS — Z7982 Long term (current) use of aspirin: Secondary | ICD-10-CM | POA: Insufficient documentation

## 2013-08-15 LAB — GLUCOSE, POCT (MANUAL RESULT ENTRY): POC Glucose: 134 mg/dl — AB (ref 70–99)

## 2013-08-15 MED ORDER — LOSARTAN POTASSIUM 50 MG PO TABS: 50.0000 mg | ORAL_TABLET | Freq: Every day | ORAL | Status: DC

## 2013-08-15 MED ORDER — GLIPIZIDE 10 MG PO TABS: 10.0000 mg | ORAL_TABLET | Freq: Two times a day (BID) | ORAL | Status: DC

## 2013-08-15 MED ORDER — METFORMIN HCL 1000 MG PO TABS: 1000.0000 mg | ORAL_TABLET | Freq: Two times a day (BID) | ORAL | Status: DC

## 2013-08-15 NOTE — Progress Notes (Signed)
Patient here to establish care Recently discharged from the hospital-diagnosis of DM Complains of persistent cough since he has been on lisinopril

## 2013-08-15 NOTE — Patient Instructions (Addendum)
Diabetes Meal Planning Guide The diabetes meal planning guide is a tool to help you plan your meals and snacks. It is important for people with diabetes to manage their blood glucose (sugar) levels. Choosing the right foods and the right amounts throughout your day will help control your blood glucose. Eating right can even help you improve your blood pressure and reach or maintain a healthy weight. CARBOHYDRATE COUNTING MADE EASY When you eat carbohydrates, they turn to sugar. This raises your blood glucose level. Counting carbohydrates can help you control this level so you feel better. When you plan your meals by counting carbohydrates, you can have more flexibility in what you eat and balance your medicine with your food intake. Carbohydrate counting simply means adding up the total amount of carbohydrate grams in your meals and snacks. Try to eat about the same amount at each meal. Foods with carbohydrates are listed below. Each portion below is 1 carbohydrate serving or 15 grams of carbohydrates. Ask your dietician how many grams of carbohydrates you should eat at each meal or snack. Grains and Starches  1 slice bread.   English muffin or hotdog/hamburger bun.   cup cold cereal (unsweetened).   cup cooked pasta or rice.   cup starchy vegetables (corn, potatoes, peas, beans, winter squash).  1 tortilla (6 inches).   bagel.  1 waffle or pancake (size of a CD).   cup cooked cereal.  4 to 6 small crackers. *Whole grain is recommended. Fruit  1 cup fresh unsweetened berries, melon, papaya, pineapple.  1 small fresh fruit.   banana or mango.   cup fruit juice (4 oz unsweetened).   cup canned fruit in natural juice or water.  2 tbs dried fruit.  12 to 15 grapes or cherries. Milk and Yogurt  1 cup fat-free or 1% milk.  1 cup soy milk.  6 oz light yogurt with sugar-free sweetener.  6 oz low-fat soy yogurt.  6 oz plain yogurt. Vegetables  1 cup raw or  cup  cooked is counted as 0 carbohydrates or a "free" food.  If you eat 3 or more servings at 1 meal, count them as 1 carbohydrate serving. Other Carbohydrates   oz chips or pretzels.   cup ice cream or frozen yogurt.   cup sherbet or sorbet.  2 inch square cake, no frosting.  1 tbs honey, sugar, jam, jelly, or syrup.  2 small cookies.  3 squares of graham crackers.  3 cups popcorn.  6 crackers.  1 cup broth-based soup.  Count 1 cup casserole or other mixed foods as 2 carbohydrate servings.  Foods with less than 20 calories in a serving may be counted as 0 carbohydrates or a "free" food. You may want to purchase a book or computer software that lists the carbohydrate gram counts of different foods. In addition, the nutrition facts panel on the labels of the foods you eat are a good source of this information. The label will tell you how big the serving size is and the total number of carbohydrate grams you will be eating per serving. Divide this number by 15 to obtain the number of carbohydrate servings in a portion. Remember, 1 carbohydrate serving equals 15 grams of carbohydrate. SERVING SIZES Measuring foods and serving sizes helps you make sure you are getting the right amount of food. The list below tells how big or small some common serving sizes are.  1 oz.........4 stacked dice.  3 oz.........Deck of cards.  1 tsp........Tip   of little finger.  1 tbs........Thumb.  2 tbs........Golf ball.   cup.......Half of a fist.  1 cup........A fist. SAMPLE DIABETES MEAL PLAN Below is a sample meal plan that includes foods from the grain and starches, dairy, vegetable, fruit, and meat groups. A dietician can individualize a meal plan to fit your calorie needs and tell you the number of servings needed from each food group. However, controlling the total amount of carbohydrates in your meal or snack is more important than making sure you include all of the food groups at every  meal. You may interchange carbohydrate containing foods (dairy, starches, and fruits). The meal plan below is an example of a 2000 calorie diet using carbohydrate counting. This meal plan has 17 carbohydrate servings. Breakfast  1 cup oatmeal (2 carb servings).   cup light yogurt (1 carb serving).  1 cup blueberries (1 carb serving).   cup almonds. Snack  1 large apple (2 carb servings).  1 low-fat string cheese stick. Lunch  Chicken breast salad.  1 cup spinach.   cup chopped tomatoes.  2 oz chicken breast, sliced.  2 tbs low-fat Italian dressing.  12 whole-wheat crackers (2 carb servings).  12 to 15 grapes (1 carb serving).  1 cup low-fat milk (1 carb serving). Snack  1 cup carrots.   cup hummus (1 carb serving). Dinner  3 oz broiled salmon.  1 cup brown rice (3 carb servings). Snack  1  cups steamed broccoli (1 carb serving) drizzled with 1 tsp olive oil and lemon juice.  1 cup light pudding (2 carb servings). DIABETES MEAL PLANNING WORKSHEET Your dietician can use this worksheet to help you decide how many servings of foods and what types of foods are right for you.  BREAKFAST Food Group and Servings / Carb Servings Grain/Starches __________________________________ Dairy __________________________________________ Vegetable ______________________________________ Fruit ___________________________________________ Meat __________________________________________ Fat ____________________________________________ LUNCH Food Group and Servings / Carb Servings Grain/Starches ___________________________________ Dairy ___________________________________________ Fruit ____________________________________________ Meat ___________________________________________ Fat _____________________________________________ DINNER Food Group and Servings / Carb Servings Grain/Starches ___________________________________ Dairy  ___________________________________________ Fruit ____________________________________________ Meat ___________________________________________ Fat _____________________________________________ SNACKS Food Group and Servings / Carb Servings Grain/Starches ___________________________________ Dairy ___________________________________________ Vegetable _______________________________________ Fruit ____________________________________________ Meat ___________________________________________ Fat _____________________________________________ DAILY TOTALS Starches _________________________ Vegetable ________________________ Fruit ____________________________ Dairy ____________________________ Meat ____________________________ Fat ______________________________ Document Released: 12/12/2004 Document Revised: 06/09/2011 Document Reviewed: 10/23/2008 Exit[employer] Patient Information 2014 [employer]. DASH Diet The DASH diet stands for "Dietary Approaches to Stop Hypertension." It is a healthy eating plan that has been shown to reduce high blood pressure (hypertension) in as little as 14 days, while also possibly providing other significant health benefits. These other health benefits include reducing the risk of breast cancer after menopause and reducing the risk of type 2 diabetes, heart disease, colon cancer, and stroke. Health benefits also include weight loss and slowing kidney failure in patients with chronic kidney disease.  DIET GUIDELINES  Limit salt (sodium). Your diet should contain less than 1500 mg of sodium daily.  Limit refined or processed carbohydrates. Your diet should include mostly whole grains. Desserts and added sugars should be used sparingly.  Include small amounts of heart-healthy fats. These types of fats include nuts, oils, and tub margarine. Limit saturated and trans fats. These fats have been shown to be harmful in the body. CHOOSING FOODS  The following food groups  are based on a 2000 calorie diet. See your Registered Dietitian for individual calorie needs. Grains and Grain Products (6 to 8 servings daily)  Eat More Often:   Whole-wheat bread, brown rice, whole-grain or wheat pasta, quinoa, popcorn without added fat or salt (air popped).  Eat Less Often: White bread, white pasta, white rice, cornbread. Vegetables (4 to 5 servings daily)  Eat More Often: Fresh, frozen, and canned vegetables. Vegetables may be raw, steamed, roasted, or grilled with a minimal amount of fat.  Eat Less Often/Avoid: Creamed or fried vegetables. Vegetables in a cheese sauce. Fruit (4 to 5 servings daily)  Eat More Often: All fresh, canned (in natural juice), or frozen fruits. Dried fruits without added sugar. One hundred percent fruit juice ( cup [237 mL] daily).  Eat Less Often: Dried fruits with added sugar. Canned fruit in light or heavy syrup. Lean Meats, Fish, and Poultry (2 servings or less daily. One serving is 3 to 4 oz [85-114 g]).  Eat More Often: Ninety percent or leaner ground beef, tenderloin, sirloin. Round cuts of beef, chicken breast, turkey breast. All fish. Grill, bake, or broil your meat. Nothing should be fried.  Eat Less Often/Avoid: Fatty cuts of meat, turkey, or chicken leg, thigh, or wing. Fried cuts of meat or fish. Dairy (2 to 3 servings)  Eat More Often: Low-fat or fat-free milk, low-fat plain or light yogurt, reduced-fat or part-skim cheese.  Eat Less Often/Avoid: Milk (whole, 2%).Whole milk yogurt. Full-fat cheeses. Nuts, Seeds, and Legumes (4 to 5 servings per week)  Eat More Often: All without added salt.  Eat Less Often/Avoid: Salted nuts and seeds, canned beans with added salt. Fats and Sweets (limited)  Eat More Often: Vegetable oils, tub margarines without trans fats, sugar-free gelatin. Mayonnaise and salad dressings.  Eat Less Often/Avoid: Coconut oils, palm oils, butter, stick margarine, cream, half and half, cookies, candy,  pie. FOR MORE INFORMATION The Dash Diet Eating Plan: www.dashdiet.org Document Released: 03/06/2011 Document Revised: 06/09/2011 Document Reviewed: 03/06/2011 Exit[employer] Patient Information 2014 [employer].  

## 2013-08-15 NOTE — Progress Notes (Signed)
Patient Demographics  Dave Arnold, is a 43 y.o. male  [id number]  [id number]  DOB - May 01, 1970  CC:  Chief Complaint  Patient presents with  . Establish Care       HPI: Dave ColumbiaKevin Arnold is a 43 y.o. male here today to establish medical care.History of diabetes hypertension, CHF, last month he was hospitalized with symptoms of polyuria polydipsia, wasn't hyperglycemia with hyperosmolar state, EMR reviewed patient was treated with have insulin and hydration, continued with metformin and glipizide and was discharged with insulin 70/30 twice a day, he is taking Coreg and lisinopril for blood pressure, patient also had atypical chest pain ACS ruled out with negative cardiac enzymes, patient also had echocardiogram which reported ejection fraction of 50-55% with diffuse hypokinesis, patient denies any more chest. Patient reported to have dry cough and thinks it might be secondary to lisinopril denies any fever chills. Patient has No headache, No chest pain, No abdominal pain - No Nausea, No new weakness tingling or numbness, No Cough - SOB.  Allergies  Allergen Reactions  . Lisinopril     cough   Past Medical History  Diagnosis Date  . Hypertension   . Obesity   . Heart failure of unknown type 06/2011  . Thrombocytopenia   . Diabetes mellitus without complication    Current Outpatient Prescriptions on File Prior to Visit  Medication Sig Dispense Refill  . aspirin EC 325 MG tablet Take 325 mg by mouth daily. Takes once to twice per day.      . carvedilol (COREG) 12.5 MG tablet Take 1 tablet (12.5 mg total) by mouth 2 (two) times daily with a meal.  60 tablet  2  . insulin NPH-regular Human (NOVOLIN 70/30 RELION) (70-30) 100 UNIT/ML injection Inject 45 Units into the skin daily with breakfast.  10 mL  11  . insulin NPH-regular Human (NOVOLIN 70/30 RELION) (70-30) 100 UNIT/ML injection Inject 20 Units into the skin daily with supper.  10 mL  11  . spironolactone  (ALDACTONE) 25 MG tablet Take 1 tablet (25 mg total) by mouth daily.  30 tablet  2   No current facility-administered medications on file prior to visit.   Family History  Problem Relation Age of Onset  . Heart disease Mother    History   Social History  . Marital Status: Single    Spouse Name: N/A    Number of Children: N/A  . Years of Education: N/A   Occupational History  . Not on file.   Social History Main Topics  . Smoking status: Never Smoker   . Smokeless tobacco: Not on file  . Alcohol Use: No  . Drug Use: No  . Sexual Activity: Not on file   Other Topics Concern  . Not on file   Social History Narrative   He is married and has two daughters, was in prison years ago but released in 2008. Denies any smoking, drinking, or drugs.     Review of Systems: Constitutional: Negative for fever, chills, diaphoresis, activity change, appetite change and fatigue. HENT: Negative for ear pain, nosebleeds, congestion, facial swelling, rhinorrhea, neck pain, neck stiffness and ear discharge.  Eyes: Negative for pain, discharge, redness, itching and visual disturbance. Respiratory: Negative for cough, choking, chest tightness, shortness of breath, wheezing and stridor.  Cardiovascular: Negative for chest pain, palpitations and leg swelling. Gastrointestinal: Negative for abdominal distention. Genitourinary: Negative for dysuria, urgency, frequency, hematuria, flank pain, decreased urine volume, difficulty urinating and dyspareunia.  Musculoskeletal: Negative for back pain, joint swelling, arthralgia and gait problem. Neurological: Negative for dizziness, tremors, seizures, syncope, facial asymmetry, speech difficulty, weakness, light-headedness, numbness and headaches.  Hematological: Negative for adenopathy. Does not bruise/bleed easily. Psychiatric/Behavioral: Negative for hallucinations, behavioral problems, confusion, dysphoric mood, decreased concentration and agitation.     Objective:   Filed Vitals:   08/15/13 0933  BP: 129/78  Pulse: 70  Temp: 99 F (37.2 C)  Resp: 16    Physical Exam: Constitutional: Obese male sitting comfortably not in acute distress HENT: Normocephalic, atraumatic, External right and left ear normal. Oropharynx is clear and moist.  Eyes: Conjunctivae and EOM are normal. PERRLA, no scleral icterus. Neck: Normal ROM. Neck supple. No JVD. No tracheal deviation. No thyromegaly. CVS: RRR, S1/S2 +, no murmurs, no gallops, no carotid bruit.  Pulmonary: Effort and breath sounds normal, no stridor, rhonchi, wheezes, rales.  Abdominal: Soft. BS +, no distension, tenderness, rebound or guarding.  Musculoskeletal: Normal range of motion. No edema and no tenderness.  Skin: Skin is warm and dry. No rash noted. Not diaphoretic. No erythema. No pallor. Psychiatric: Normal mood and affect. Behavior, judgment, thought content normal.  Lab Results  Component Value Date   WBC 6.3 07/07/2013   HGB 12.3* 07/07/2013   HCT 35.6* 07/07/2013   MCV 86.8 07/07/2013   PLT 82* 07/07/2013   Lab Results  Component Value Date   CREATININE 0.88 07/07/2013   BUN 16 07/07/2013   NA 135* 07/07/2013   K 3.8 07/07/2013   CL 98 07/07/2013   CO2 23 07/07/2013    Lab Results  Component Value Date   HGBA1C 14.4* 07/07/2013   Lipid Panel     Component Value Date/Time   CHOL 142 07/20/2011 0508   TRIG 122 07/20/2011 0508   HDL 20* 07/20/2011 0508   CHOLHDL 7.1 07/20/2011 0508   VLDL 24 07/20/2011 0508   LDLCALC 98 07/20/2011 0508       Assessment and plan:   1. DM (diabetes mellitus) Results for orders placed in visit on 08/15/13  GLUCOSE, POCT (MANUAL RESULT ENTRY)      Result Value Ref Range   POC Glucose 134 (*) 70 - 99 mg/dl   I have advised patient for diabetes meal planning, continue with metformin Glucotrol and insulin 70/30, patient will monitor his fingerstick glucose, I have advised patient to increase the dose of insulin to 22 units and gradually increase  up to 25 units if his fasting sugar is more than 150 mg a deciliter.  - Glucose (CBG) - metFORMIN (GLUCOPHAGE) 1000 MG tablet; Take 1 tablet (1,000 mg total) by mouth 2 (two) times daily with a meal.  Dispense: 60 tablet; Refill: 3 - glipiZIDE (GLUCOTROL) 10 MG tablet; Take 1 tablet (10 mg total) by mouth 2 (two) times daily before a meal.  Dispense: 60 tablet; Refill: 3 - COMPLETE METABOLIC PANEL WITH GFR; Future  2. Hypertension I have discontinued lisinopril, started on losartan since patient complained of coughing. - losartan (COZAAR) 50 MG tablet; Take 1 tablet (50 mg total) by mouth daily.  Dispense: 90 tablet; Refill: 2  3. Obesity Diet and exercise  4. Screening Fasting blood work  - Lipid panel; Future - Vit D  25 hydroxy (rtn osteoporosis monitoring); Future - CBC with Differential; Future  5. Blurry vision  - Ambulatory referral to Ophthalmology      Return in about 3 months (around 11/15/2013) for diabetes, hypertension.   Doris Cheadleeepak Crew Goren, MD

## 2013-10-11 ENCOUNTER — Other Ambulatory Visit: Payer: Self-pay

## 2013-10-11 MED ORDER — CARVEDILOL 12.5 MG PO TABS: 12.5000 mg | ORAL_TABLET | Freq: Two times a day (BID) | ORAL | Status: DC

## 2013-10-11 MED ORDER — SPIRONOLACTONE 25 MG PO TABS: 25.0000 mg | ORAL_TABLET | Freq: Every day | ORAL | Status: DC

## 2013-11-10 ENCOUNTER — Other Ambulatory Visit: Payer: Self-pay | Admitting: Internal Medicine

## 2013-11-11 ENCOUNTER — Other Ambulatory Visit: Payer: Self-pay | Admitting: Internal Medicine

## 2013-11-14 ENCOUNTER — Ambulatory Visit: Payer: Self-pay | Attending: Internal Medicine | Admitting: Internal Medicine

## 2013-11-14 ENCOUNTER — Encounter: Payer: Self-pay | Admitting: Internal Medicine

## 2013-11-14 VITALS — BP 140/70 | HR 100 | Temp 98.4°F | Resp 16 | Wt 316.0 lb

## 2013-11-14 DIAGNOSIS — Z7982 Long term (current) use of aspirin: Secondary | ICD-10-CM | POA: Insufficient documentation

## 2013-11-14 DIAGNOSIS — Z794 Long term (current) use of insulin: Secondary | ICD-10-CM | POA: Insufficient documentation

## 2013-11-14 DIAGNOSIS — I509 Heart failure, unspecified: Secondary | ICD-10-CM | POA: Insufficient documentation

## 2013-11-14 DIAGNOSIS — E139 Other specified diabetes mellitus without complications: Secondary | ICD-10-CM

## 2013-11-14 DIAGNOSIS — E119 Type 2 diabetes mellitus without complications: Secondary | ICD-10-CM | POA: Insufficient documentation

## 2013-11-14 DIAGNOSIS — E089 Diabetes mellitus due to underlying condition without complications: Secondary | ICD-10-CM

## 2013-11-14 DIAGNOSIS — E0865 Diabetes mellitus due to underlying condition with hyperglycemia: Secondary | ICD-10-CM

## 2013-11-14 DIAGNOSIS — Z139 Encounter for screening, unspecified: Secondary | ICD-10-CM

## 2013-11-14 DIAGNOSIS — E1369 Other specified diabetes mellitus with other specified complication: Secondary | ICD-10-CM

## 2013-11-14 DIAGNOSIS — I10 Essential (primary) hypertension: Secondary | ICD-10-CM | POA: Insufficient documentation

## 2013-11-14 LAB — POCT GLYCOSYLATED HEMOGLOBIN (HGB A1C): Hemoglobin A1C: 6.1

## 2013-11-14 LAB — GLUCOSE, POCT (MANUAL RESULT ENTRY): POC GLUCOSE: 121 mg/dL — AB (ref 70–99)

## 2013-11-14 MED ORDER — METFORMIN HCL 1000 MG PO TABS: 1000.0000 mg | ORAL_TABLET | Freq: Two times a day (BID) | ORAL | Status: DC

## 2013-11-14 MED ORDER — SPIRONOLACTONE 25 MG PO TABS: ORAL_TABLET | ORAL | Status: DC

## 2013-11-14 MED ORDER — LOSARTAN POTASSIUM 50 MG PO TABS: 50.0000 mg | ORAL_TABLET | Freq: Every day | ORAL | Status: DC

## 2013-11-14 MED ORDER — GLIPIZIDE 10 MG PO TABS: 10.0000 mg | ORAL_TABLET | Freq: Two times a day (BID) | ORAL | Status: DC

## 2013-11-14 NOTE — Patient Instructions (Signed)
Hypoglycemia °Hypoglycemia occurs when the glucose in your blood is too low. Glucose is a type of sugar that is your body's main energy source. Hormones, such as insulin and glucagon, control the level of glucose in the blood. Insulin lowers blood glucose and glucagon increases blood glucose. Having too much insulin in your blood stream, or not eating enough food containing sugar, can result in hypoglycemia. Hypoglycemia can happen to people with or without diabetes. It can develop quickly and can be a medical emergency.  °CAUSES  °· Missing or delaying meals. °· Not eating enough carbohydrates at meals. °· Taking too much diabetes medicine. °· Not timing your oral diabetes medicine or insulin doses with meals, snacks, and exercise. °· Nausea and vomiting. °· Certain medicines. °· Severe illnesses, such as hepatitis, kidney disorders, and certain eating disorders. °· Increased activity or exercise without eating something extra or adjusting medicines. °· Drinking too much alcohol. °· A nerve disorder that affects body functions like your heart rate, blood pressure, and digestion (autonomic neuropathy). °· A condition where the stomach muscles do not function properly (gastroparesis). Therefore, medicines and food may not absorb properly. °· Rarely, a tumor of the pancreas can produce too much insulin. °SYMPTOMS  °· Hunger. °· Sweating (diaphoresis). °· Change in body temperature. °· Shakiness. °· Headache. °· Anxiety. °· Lightheadedness. °· Irritability. °· Difficulty concentrating. °· Dry mouth. °· Tingling or numbness in the hands or feet. °· Restless sleep or sleep disturbances. °· Altered speech and coordination. °· Change in mental status. °· Seizures or prolonged convulsions. °· Combativeness. °· Drowsiness (lethargic). °· Weakness. °· Increased heart rate or palpitations. °· Confusion. °· Pale, gray skin color. °· Blurred or double vision. °· Fainting. °DIAGNOSIS  °A physical exam and medical history will be  performed. Your caregiver may make a diagnosis based on your symptoms. Blood tests and other lab tests may be performed to confirm a diagnosis. Once the diagnosis is made, your caregiver will see if your signs and symptoms go away once your blood glucose is raised.  °TREATMENT  °Usually, you can easily treat your hypoglycemia when you notice symptoms. °· Check your blood glucose. If it is less than 70 mg/dl, take one of the following:   °¨ 3-4 glucose tablets.   °¨ ½ cup juice.   °¨ ½ cup regular soda.   °¨ 1 cup skim milk.   °¨ ½-1 tube of glucose gel.   °¨ 5-6 hard candies.   °· Avoid high-fat drinks or food that may delay a rise in blood glucose levels. °· Do not take more than the recommended amount of sugary foods, drinks, gel, or tablets. Doing so will cause your blood glucose to go too high.   °· Wait 10-15 minutes and recheck your blood glucose. If it is still less than 70 mg/dl or below your target range, repeat treatment.   °· Eat a snack if it is more than 1 hour until your next meal.   °There may be a time when your blood glucose may go so low that you are unable to treat yourself at home when you start to notice symptoms. You may need someone to help you. You may even faint or be unable to swallow. If you cannot treat yourself, someone will need to bring you to the hospital.  °HOME CARE INSTRUCTIONS °· If you have diabetes, follow your diabetes management plan by: °¨ Taking your medicines as directed. °¨ Following your exercise plan. °¨ Following your meal plan. Do not skip meals. Eat on time. °¨ Testing your blood   glucose regularly. Check your blood glucose before and after exercise. If you exercise longer or different than usual, be sure to check blood glucose more frequently. °¨ Wearing your medical alert jewelry that says you have diabetes. °· Identify the cause of your hypoglycemia. Then, develop ways to prevent the recurrence of hypoglycemia. °· Do not take a hot bath or shower right after an  insulin shot. °· Always carry treatment with you. Glucose tablets are the easiest to carry. °· If you are going to drink alcohol, drink it only with meals. °· Tell friends or family members ways to keep you safe during a seizure. This may include removing hard or sharp objects from the area or turning you on your side. °· Maintain a healthy weight. °SEEK MEDICAL CARE IF:  °· You are having problems keeping your blood glucose in your target range. °· You are having frequent episodes of hypoglycemia. °· You feel you might be having side effects from your medicines. °· You are not sure why your blood glucose is dropping so low. °· You notice a change in vision or a new problem with your vision. °SEEK IMMEDIATE MEDICAL CARE IF:  °· Confusion develops. °· A change in mental status occurs. °· The inability to swallow develops. °· Fainting occurs. °Document Released: 03/17/2005 Document Revised: 03/22/2013 Document Reviewed: 07/14/2011 °[employer]® Patient Information ©2015 [employer]. This information is not intended to replace advice given to you by your health care provider. Make sure you discuss any questions you have with your health care provider. ° °

## 2013-11-14 NOTE — Progress Notes (Signed)
MRN: [id number] Name: Dave Arnold  Sex: male Age: 43 y.o. DOB: July 19, 1970  Allergies: Lisinopril  Chief Complaint  Patient presents with  . Follow-up    HPI: Patient is 43 y.o. male who hypertension comes today for followup, patient has been compliant in his taking medications initially his blood pressure was elevated but repeat manual blood pressure is 140/70 patient denies any headache dizziness chest and shortness of breath, his hemoglobin A1c is much improved, currently 6.1%, he has been taking insulin 70/30 45 units with breakfast, has not been taking his nighttime insulin, he is also taking metformin and Glucotrol, he denies any hypoglycemic symptoms. Patient is requesting refill on his medications.  Past Medical History  Diagnosis Date  . Hypertension   . Obesity   . Heart failure of unknown type 06/2011  . Thrombocytopenia   . Diabetes mellitus without complication     History reviewed. No pertinent past surgical history.    Medication List       This list is accurate as of: 11/14/13  3:22 PM.  Always use your most recent med list.               aspirin EC 325 MG tablet  Take 325 mg by mouth daily. Takes once to twice per day.     carvedilol 12.5 MG tablet  Commonly known as:  COREG  Take 1 tablet (12.5 mg total) by mouth 2 (two) times daily with a meal.     glipiZIDE 10 MG tablet  Commonly known as:  GLUCOTROL  Take 1 tablet (10 mg total) by mouth 2 (two) times daily before a meal.     insulin NPH-regular Human (70-30) 100 UNIT/ML injection  Commonly known as:  NOVOLIN 70/30 RELION  Inject 45 Units into the skin daily with breakfast.     insulin NPH-regular Human (70-30) 100 UNIT/ML injection  Commonly known as:  NOVOLIN 70/30 RELION  Inject 20 Units into the skin daily with supper.     losartan 50 MG tablet  Commonly known as:  COZAAR  Take 1 tablet (50 mg total) by mouth daily.     metFORMIN 1000 MG tablet  Commonly known as:   GLUCOPHAGE  Take 1 tablet (1,000 mg total) by mouth 2 (two) times daily with a meal.     spironolactone 25 MG tablet  Commonly known as:  ALDACTONE  TAKE 1 TABLET BY MOUTH DAILY.        Meds ordered this encounter  Medications  . glipiZIDE (GLUCOTROL) 10 MG tablet    Sig: Take 1 tablet (10 mg total) by mouth 2 (two) times daily before a meal.    Dispense:  60 tablet    Refill:  3  . losartan (COZAAR) 50 MG tablet    Sig: Take 1 tablet (50 mg total) by mouth daily.    Dispense:  90 tablet    Refill:  2  . metFORMIN (GLUCOPHAGE) 1000 MG tablet    Sig: Take 1 tablet (1,000 mg total) by mouth 2 (two) times daily with a meal.    Dispense:  60 tablet    Refill:  3  . spironolactone (ALDACTONE) 25 MG tablet    Sig: TAKE 1 TABLET BY MOUTH DAILY.    Dispense:  30 tablet    Refill:  3    Immunization History  Administered Date(s) Administered  . Tdap 04/04/2013    Family History  Problem Relation Age of Onset  . Heart disease  Mother     History  Substance Use Topics  . Smoking status: Never Smoker   . Smokeless tobacco: Not on file  . Alcohol Use: No    Review of Systems   As noted in HPI  Filed Vitals:   11/14/13 1515  BP: 140/70  Pulse:   Temp:   Resp:     Physical Exam  Physical Exam  Constitutional: No distress.  Eyes: EOM are normal. Pupils are equal, round, and reactive to light.  Cardiovascular: Normal rate and regular rhythm.   Pulmonary/Chest: Breath sounds normal. No respiratory distress. He has no wheezes. He has no rales.  Musculoskeletal:  Trace pedal edema    CBC    Component Value Date/Time   WBC 6.3 07/07/2013 0223   RBC 4.10* 07/07/2013 0223   HGB 12.3* 07/07/2013 0223   HCT 35.6* 07/07/2013 0223   PLT 82* 07/07/2013 0223   MCV 86.8 07/07/2013 0223   LYMPHSABS 2.8 04/05/2013 0110   MONOABS 0.6 04/05/2013 0110   EOSABS 0.2 04/05/2013 0110   BASOSABS 0.0 04/05/2013 0110    CMP     Component Value Date/Time   NA 135* 07/07/2013 0223   K 3.8  07/07/2013 0223   CL 98 07/07/2013 0223   CO2 23 07/07/2013 0223   GLUCOSE 385* 07/07/2013 0223   BUN 16 07/07/2013 0223   CREATININE 0.88 07/07/2013 0223   CALCIUM 8.7 07/07/2013 0223   PROT 7.2 04/05/2013 0110   ALBUMIN 3.8 04/05/2013 0110   AST 35 04/05/2013 0110   ALT 37 04/05/2013 0110   ALKPHOS 101 04/05/2013 0110   BILITOT 0.7 04/05/2013 0110   GFRNONAA >90 07/07/2013 0223   GFRAA >90 07/07/2013 0223    Lab Results  Component Value Date/Time   CHOL 142 07/20/2011  5:08 AM    No components found with this basename: hga1c    Lab Results  Component Value Date/Time   AST 35 04/05/2013  1:10 AM    Assessment and Plan  Diabetes mellitus due to underlying condition without complications - Plan:  Results for orders placed in visit on 11/14/13  GLUCOSE, POCT (MANUAL RESULT ENTRY)      Result Value Ref Range   POC Glucose 121 (*) 70 - 99 mg/dl  POCT GLYCOSYLATED HEMOGLOBIN (HGB A1C)      Result Value Ref Range   Hemoglobin A1C 6.1     Last hemoglobin A1c was 14.4%, patient is educated about hypoglycemic symptoms, she will continue with her glipiZIDE (GLUCOTROL) 10 MG tablet, metFORMIN (GLUCOPHAGE) 1000 MG tablet, now he will reduce his insulin, he'll take Novolin 70/30  20 units twice a day, continue with diet modification.  Essential hypertension - Plan: Advised for DASH diet continue with her losartan (COZAAR) 50 MG tablet, Coreg and spironolactone.  DM (diabetes mellitus) - Plan: COMPLETE METABOLIC PANEL WITH GFR  Screening - Plan: Lipid panel, Vit D  25 hydroxy (rtn osteoporosis monitoring), CBC with Differential    Return in about 3 months (around 02/14/2014) for diabetes, hypertension.  Doris Cheadle, MD

## 2013-11-14 NOTE — Progress Notes (Signed)
Patient here for follow up on his DM and HTN 

## 2013-11-15 ENCOUNTER — Telehealth: Payer: Self-pay

## 2013-11-15 LAB — CBC WITH DIFFERENTIAL/PLATELET
Basophils Absolute: 0 10*3/uL (ref 0.0–0.1)
Basophils Relative: 0 % (ref 0–1)
Eosinophils Absolute: 0.2 10*3/uL (ref 0.0–0.7)
Eosinophils Relative: 2 % (ref 0–5)
HCT: 40.5 % (ref 39.0–52.0)
HEMOGLOBIN: 14.4 g/dL (ref 13.0–17.0)
LYMPHS ABS: 2.4 10*3/uL (ref 0.7–4.0)
LYMPHS PCT: 31 % (ref 12–46)
MCH: 29.2 pg (ref 26.0–34.0)
MCHC: 35.6 g/dL (ref 30.0–36.0)
MCV: 82.2 fL (ref 78.0–100.0)
Monocytes Absolute: 0.6 10*3/uL (ref 0.1–1.0)
Monocytes Relative: 7 % (ref 3–12)
NEUTROS PCT: 60 % (ref 43–77)
Neutro Abs: 4.7 10*3/uL (ref 1.7–7.7)
PLATELETS: 125 10*3/uL — AB (ref 150–400)
RBC: 4.93 MIL/uL (ref 4.22–5.81)
RDW: 14 % (ref 11.5–15.5)
WBC: 7.9 10*3/uL (ref 4.0–10.5)

## 2013-11-15 LAB — VITAMIN D 25 HYDROXY (VIT D DEFICIENCY, FRACTURES): Vit D, 25-Hydroxy: 33 ng/mL (ref 30–89)

## 2013-11-15 LAB — COMPLETE METABOLIC PANEL WITH GFR
ALBUMIN: 4.5 g/dL (ref 3.5–5.2)
ALT: 30 U/L (ref 0–53)
AST: 25 U/L (ref 0–37)
Alkaline Phosphatase: 75 U/L (ref 39–117)
BUN: 12 mg/dL (ref 6–23)
CALCIUM: 9.5 mg/dL (ref 8.4–10.5)
CO2: 31 meq/L (ref 19–32)
Chloride: 105 mEq/L (ref 96–112)
Creat: 0.76 mg/dL (ref 0.50–1.35)
GFR, Est Non African American: >89 mL/min
Glucose, Bld: 116 mg/dL — ABNORMAL HIGH (ref 70–99)
POTASSIUM: 3.8 meq/L (ref 3.5–5.3)
Sodium: 143 mEq/L (ref 135–145)
Total Bilirubin: 0.5 mg/dL (ref 0.2–1.2)
Total Protein: 7.1 g/dL (ref 6.0–8.3)

## 2013-11-15 LAB — LIPID PANEL
Cholesterol: 164 mg/dL (ref 0–200)
HDL: 28 mg/dL — ABNORMAL LOW (ref >39)
LDL CALC: 64 mg/dL (ref 0–99)
Total CHOL/HDL Ratio: 5.9 Ratio
Triglycerides: 360 mg/dL — ABNORMAL HIGH (ref <150)
VLDL: 72 mg/dL — AB (ref 0–40)

## 2013-11-15 NOTE — Telephone Encounter (Signed)
Patient is aware of his lab results 

## 2013-11-15 NOTE — Telephone Encounter (Signed)
Message copied by Lestine MountJUAREZ, Adelie Croswell L on Tue Nov 15, 2013 11:01 AM ------      Message from: Doris CheadleADVANI, DEEPAK      Created: Tue Nov 15, 2013  9:21 AM       Blood work reviewed, noticed elevated triglycerides, advise patient for low fat diet.        ------

## 2014-02-15 ENCOUNTER — Telehealth: Payer: Self-pay | Admitting: Internal Medicine

## 2014-02-15 NOTE — Telephone Encounter (Signed)
Please call patient, he would like to discuss medication refills.

## 2014-02-16 ENCOUNTER — Telehealth: Payer: Self-pay | Admitting: Emergency Medicine

## 2014-02-16 MED ORDER — CARVEDILOL 12.5 MG PO TABS: 12.5000 mg | ORAL_TABLET | Freq: Two times a day (BID) | ORAL | Status: DC

## 2014-02-16 NOTE — Telephone Encounter (Signed)
Spoke with pt in regards to questions/concerns regarding medication refill Pt called in c/o how Pharmacy treated him for med refill. Pt states he was told that he will [facility] due to multiple free courtesy meds per Pharmacy After speaking with Porfirio Mylararmen, pt was told we want to help him get meds but he has to provide clinic with proper financial documentation I told pt we can provide free meds through [facility] program but he will need IRS documentation Pt states he is currently in a financial and home life situation causing a delay in paperwork but will do the best he can to get papers I scheduled pt office visit for DM management,A1c with Dr. Orpah CobbAdvani 02/28/14 [facility] Tuesday

## 2014-02-28 ENCOUNTER — Ambulatory Visit: Payer: Self-pay | Admitting: Internal Medicine

## 2014-03-02 ENCOUNTER — Other Ambulatory Visit: Payer: Self-pay

## 2014-03-02 MED ORDER — INSULIN NPH ISOPHANE & REGULAR (70-30) 100 UNIT/ML ~~LOC~~ SUSP: 20.0000 [IU] | Freq: Every day | SUBCUTANEOUS | Status: DC

## 2014-03-06 ENCOUNTER — Telehealth: Payer: Self-pay | Admitting: Internal Medicine

## 2014-03-06 NOTE — Telephone Encounter (Signed)
Pt has been experiencing severe pain in wrist/vein for last 3-4 days. Please f/u with pt.   Pt also requesting needles for insulin.

## 2014-03-06 NOTE — Telephone Encounter (Signed)
Attempted to return patient's call to discuss left wrist pain. Left message on patient's VM to return call  Patient has appt with PCP on 03/14/14

## 2014-03-14 ENCOUNTER — Ambulatory Visit: Payer: Self-pay | Attending: Internal Medicine | Admitting: Internal Medicine

## 2014-03-14 ENCOUNTER — Encounter: Payer: Self-pay | Admitting: Internal Medicine

## 2014-03-14 ENCOUNTER — Ambulatory Visit (HOSPITAL_BASED_OUTPATIENT_CLINIC_OR_DEPARTMENT_OTHER): Payer: Self-pay

## 2014-03-14 VITALS — BP 130/80 | HR 80 | Temp 98.0°F | Resp 16 | Wt 321.6 lb

## 2014-03-14 DIAGNOSIS — Z0000 Encounter for general adult medical examination without abnormal findings: Secondary | ICD-10-CM

## 2014-03-14 DIAGNOSIS — I10 Essential (primary) hypertension: Secondary | ICD-10-CM

## 2014-03-14 DIAGNOSIS — Z7982 Long term (current) use of aspirin: Secondary | ICD-10-CM | POA: Insufficient documentation

## 2014-03-14 DIAGNOSIS — Z794 Long term (current) use of insulin: Secondary | ICD-10-CM | POA: Insufficient documentation

## 2014-03-14 DIAGNOSIS — Z09 Encounter for follow-up examination after completed treatment for conditions other than malignant neoplasm: Secondary | ICD-10-CM | POA: Insufficient documentation

## 2014-03-14 DIAGNOSIS — Z23 Encounter for immunization: Secondary | ICD-10-CM

## 2014-03-14 DIAGNOSIS — E089 Diabetes mellitus due to underlying condition without complications: Secondary | ICD-10-CM

## 2014-03-14 DIAGNOSIS — E781 Pure hyperglyceridemia: Secondary | ICD-10-CM

## 2014-03-14 DIAGNOSIS — Z760 Encounter for issue of repeat prescription: Secondary | ICD-10-CM | POA: Insufficient documentation

## 2014-03-14 DIAGNOSIS — E139 Other specified diabetes mellitus without complications: Secondary | ICD-10-CM

## 2014-03-14 LAB — GLUCOSE, POCT (MANUAL RESULT ENTRY): POC GLUCOSE: 158 mg/dL — AB (ref 70–99)

## 2014-03-14 LAB — POCT GLYCOSYLATED HEMOGLOBIN (HGB A1C): Hemoglobin A1C: 6.6

## 2014-03-14 MED ORDER — CARVEDILOL 12.5 MG PO TABS: 12.5000 mg | ORAL_TABLET | Freq: Two times a day (BID) | ORAL | Status: DC

## 2014-03-14 MED ORDER — INSULIN NPH ISOPHANE & REGULAR (70-30) 100 UNIT/ML ~~LOC~~ SUSP: 20.0000 [IU] | Freq: Two times a day (BID) | SUBCUTANEOUS | Status: DC

## 2014-03-14 MED ORDER — METFORMIN HCL 1000 MG PO TABS: 1000.0000 mg | ORAL_TABLET | Freq: Two times a day (BID) | ORAL | Status: DC

## 2014-03-14 MED ORDER — GLIPIZIDE 10 MG PO TABS: 10.0000 mg | ORAL_TABLET | Freq: Two times a day (BID) | ORAL | Status: DC

## 2014-03-14 MED ORDER — LOSARTAN POTASSIUM 50 MG PO TABS: 50.0000 mg | ORAL_TABLET | Freq: Every day | ORAL | Status: DC

## 2014-03-14 NOTE — Patient Instructions (Addendum)
Fat and Cholesterol Control Diet Fat and cholesterol levels in your blood and organs are influenced by your diet. High levels of fat and cholesterol may lead to diseases of the heart, small and large blood vessels, gallbladder, liver, and pancreas. CONTROLLING FAT AND CHOLESTEROL WITH DIET Although exercise and lifestyle factors are important, your diet is key. That is because certain foods are known to raise cholesterol and others to lower it. The goal is to balance foods for their effect on cholesterol and more importantly, to replace saturated and trans fat with other types of fat, such as monounsaturated fat, polyunsaturated fat, and omega-3 fatty acids. On average, a person should consume no more than 15 to 17 g of saturated fat daily. Saturated and trans fats are considered "bad" fats, and they will raise LDL cholesterol. Saturated fats are primarily found in animal products such as meats, butter, and cream. However, that does not mean you need to give up all your favorite foods. Today, there are good tasting, low-fat, low-cholesterol substitutes for most of the things you like to eat. Choose low-fat or nonfat alternatives. Choose round or loin cuts of red meat. These types of cuts are lowest in fat and cholesterol. Chicken (without the skin), fish, veal, and ground turkey breast are great choices. Eliminate fatty meats, such as hot dogs and salami. Even shellfish have little or no saturated fat. Have a 3 oz (85 g) portion when you eat lean meat, poultry, or fish. Trans fats are also called "partially hydrogenated oils." They are oils that have been scientifically manipulated so that they are solid at room temperature resulting in a longer shelf life and improved taste and texture of foods in which they are added. Trans fats are found in stick margarine, some tub margarines, cookies, crackers, and baked goods.  When baking and cooking, oils are a great substitute for butter. The monounsaturated oils are  especially beneficial since it is believed they lower LDL and raise HDL. The oils you should avoid entirely are saturated tropical oils, such as coconut and palm.  Remember to eat a lot from food groups that are naturally free of saturated and trans fat, including fish, fruit, vegetables, beans, grains (barley, rice, couscous, bulgur wheat), and pasta (without cream sauces).  IDENTIFYING FOODS THAT LOWER FAT AND CHOLESTEROL  Soluble fiber may lower your cholesterol. This type of fiber is found in fruits such as apples, vegetables such as broccoli, potatoes, and carrots, legumes such as beans, peas, and lentils, and grains such as barley. Foods fortified with plant sterols (phytosterol) may also lower cholesterol. You should eat at least 2 g per day of these foods for a cholesterol lowering effect.  Read package labels to identify low-saturated fats, trans fat free, and low-fat foods at the supermarket. Select cheeses that have only 2 to 3 g saturated fat per ounce. Use a heart-healthy tub margarine that is free of trans fats or partially hydrogenated oil. When buying baked goods (cookies, crackers), avoid partially hydrogenated oils. Breads and muffins should be made from whole grains (whole-wheat or whole oat flour, instead of "flour" or "enriched flour"). Buy non-creamy canned soups with reduced salt and no added fats.  FOOD PREPARATION TECHNIQUES  Never deep-fry. If you must fry, either stir-fry, which uses very little fat, or use non-stick cooking sprays. When possible, broil, bake, or roast meats, and steam vegetables. Instead of putting butter or margarine on vegetables, use lemon and herbs, applesauce, and cinnamon (for squash and sweet potatoes). Use nonfat   yogurt, salsa, and low-fat dressings for salads.  LOW-SATURATED FAT / LOW-FAT FOOD SUBSTITUTES Meats / Saturated Fat (g)  Avoid: Steak, marbled (3 oz/85 g) / 11 g  Choose: Steak, lean (3 oz/85 g) / 4 g  Avoid: Hamburger (3 oz/85 g) / 7  g  Choose: Hamburger, lean (3 oz/85 g) / 5 g  Avoid: Ham (3 oz/85 g) / 6 g  Choose: Ham, lean cut (3 oz/85 g) / 2.4 g  Avoid: Chicken, with skin, dark meat (3 oz/85 g) / 4 g  Choose: Chicken, skin removed, dark meat (3 oz/85 g) / 2 g  Avoid: Chicken, with skin, light meat (3 oz/85 g) / 2.5 g  Choose: Chicken, skin removed, light meat (3 oz/85 g) / 1 g Dairy / Saturated Fat (g)  Avoid: Whole milk (1 cup) / 5 g  Choose: Low-fat milk, 2% (1 cup) / 3 g  Choose: Low-fat milk, 1% (1 cup) / 1.5 g  Choose: Skim milk (1 cup) / 0.3 g  Avoid: Hard cheese (1 oz/28 g) / 6 g  Choose: Skim milk cheese (1 oz/28 g) / 2 to 3 g  Avoid: Cottage cheese, 4% fat (1 cup) / 6.5 g  Choose: Low-fat cottage cheese, 1% fat (1 cup) / 1.5 g  Avoid: Ice cream (1 cup) / 9 g  Choose: Sherbet (1 cup) / 2.5 g  Choose: Nonfat frozen yogurt (1 cup) / 0.3 g  Choose: Frozen fruit bar / trace  Avoid: Whipped cream (1 tbs) / 3.5 g  Choose: Nondairy whipped topping (1 tbs) / 1 g Condiments / Saturated Fat (g)  Avoid: Mayonnaise (1 tbs) / 2 g  Choose: Low-fat mayonnaise (1 tbs) / 1 g  Avoid: Butter (1 tbs) / 7 g  Choose: Extra light margarine (1 tbs) / 1 g  Avoid: Coconut oil (1 tbs) / 11.8 g  Choose: Olive oil (1 tbs) / 1.8 g  Choose: Corn oil (1 tbs) / 1.7 g  Choose: Safflower oil (1 tbs) / 1.2 g  Choose: Sunflower oil (1 tbs) / 1.4 g  Choose: Soybean oil (1 tbs) / 2.4 g  Choose: Canola oil (1 tbs) / 1 g Document Released: 03/17/2005 Document Revised: 07/12/2012 Document Reviewed: 06/15/2013 Exit[employer] Patient Information 2015 [employer]. This information is not intended to replace advice given to you by your health care provider. Make sure you discuss any questions you have with your health care provider. Diabetes Mellitus and Food It is important for you to manage your blood sugar (glucose) level. Your blood glucose level can be greatly affected by what you eat. Eating healthier  foods in the appropriate amounts throughout the day at about the same time each day will help you control your blood glucose level. It can also help slow or prevent worsening of your diabetes mellitus. Healthy eating may even help you improve the level of your blood pressure and reach or maintain a healthy weight.  HOW CAN FOOD AFFECT ME? Carbohydrates Carbohydrates affect your blood glucose level more than any other type of food. Your dietitian will help you determine how many carbohydrates to eat at each meal and teach you how to count carbohydrates. Counting carbohydrates is important to keep your blood glucose at a healthy level, especially if you are using insulin or taking certain medicines for diabetes mellitus. Alcohol Alcohol can cause sudden decreases in blood glucose (hypoglycemia), especially if you use insulin or take certain medicines for diabetes mellitus. Hypoglycemia can be a life-threatening   condition. Symptoms of hypoglycemia (sleepiness, dizziness, and disorientation) are similar to symptoms of having too much alcohol.  If your health care provider has given you approval to drink alcohol, do so in moderation and use the following guidelines:  Women should not have more than one drink per day, and men should not have more than two drinks per day. One drink is equal to:  12 oz of beer.  5 oz of wine.  1 oz of hard liquor.  Do not drink on an empty stomach.  Keep yourself hydrated. Have water, diet soda, or unsweetened iced tea.  Regular soda, juice, and other mixers might contain a lot of carbohydrates and should be counted. WHAT FOODS ARE NOT RECOMMENDED? As you make food choices, it is important to remember that all foods are not the same. Some foods have fewer nutrients per serving than other foods, even though they might have the same number of calories or carbohydrates. It is difficult to get your body what it needs when you eat foods with fewer nutrients. Examples of  foods that you should avoid that are high in calories and carbohydrates but low in nutrients include:  Trans fats (most processed foods list trans fats on the Nutrition Facts label).  Regular soda.  Juice.  Candy.  Sweets, such as cake, pie, doughnuts, and cookies.  Fried foods. WHAT FOODS CAN I EAT? Have nutrient-rich foods, which will nourish your body and keep you healthy. The food you should eat also will depend on several factors, including:  The calories you need.  The medicines you take.  Your weight.  Your blood glucose level.  Your blood pressure level.  Your cholesterol level. You also should eat a variety of foods, including:  Protein, such as meat, poultry, fish, tofu, nuts, and seeds (lean animal proteins are best).  Fruits.  Vegetables.  Dairy products, such as milk, cheese, and yogurt (low fat is best).  Breads, grains, pasta, cereal, rice, and beans.  Fats such as olive oil, trans fat-free margarine, canola oil, avocado, and olives. DOES EVERYONE WITH DIABETES MELLITUS HAVE THE SAME MEAL PLAN? Because every person with diabetes mellitus is different, there is not one meal plan that works for everyone. It is very important that you meet with a dietitian who will help you create a meal plan that is just right for you. Document Released: 12/12/2004 Document Revised: 03/22/2013 Document Reviewed: 02/11/2013 Exit[employer] Patient Information 2015 [employer]. This information is not intended to replace advice given to you by your health care provider. Make sure you discuss any questions you have with your health care provider.  

## 2014-03-14 NOTE — Progress Notes (Signed)
MRN: [id number] Name: Dave Arnold  Sex: male Age: 43 y.o. DOB: [birth year]  Allergies: Lisinopril  Chief Complaint  Patient presents with  . Follow-up    HPI: Patient is 43 y.o. male who history of diabetes hypertension hypertriglyceridemia comes today for followup , as per patient he is taking glipizide, metformin and insulin 70/30 45 units in the morning but does not take it at night, on the last visit he was advised to take it twice a day 20 units, his hemoglobin A1c has slightly trended up to 6.6%, his blood pressure initially was elevated but repeat manual blood pressure is 130/80, patient denies any headache dizziness chest and shortness of breath.patient is requesting refill on his medications.  Past Medical History  Diagnosis Date  . Hypertension   . Obesity   . Heart failure of unknown type 06/2011  . Thrombocytopenia   . Diabetes mellitus without complication     No past surgical history on file.    Medication List       This list is accurate as of: 03/14/14 12:35 PM.  Always use your most recent med list.               aspirin EC 325 MG tablet  Take 325 mg by mouth daily. Takes once to twice per day.     carvedilol 12.5 MG tablet  Commonly known as:  COREG  Take 1 tablet (12.5 mg total) by mouth 2 (two) times daily with a meal.     glipiZIDE 10 MG tablet  Commonly known as:  GLUCOTROL  Take 1 tablet (10 mg total) by mouth 2 (two) times daily before a meal.     insulin NPH-regular Human (70-30) 100 UNIT/ML injection  Commonly known as:  NOVOLIN 70/30 RELION  Inject 20 Units into the skin daily with supper.     insulin NPH-regular Human (70-30) 100 UNIT/ML injection  Commonly known as:  NOVOLIN 70/30 RELION  Inject 20 Units into the skin 2 (two) times daily with a meal.     losartan 50 MG tablet  Commonly known as:  COZAAR  Take 1 tablet (50 mg total) by mouth daily.     metFORMIN 1000 MG tablet  Commonly known as:  GLUCOPHAGE  Take 1  tablet (1,000 mg total) by mouth 2 (two) times daily with a meal.     spironolactone 25 MG tablet  Commonly known as:  ALDACTONE  TAKE 1 TABLET BY MOUTH DAILY.        Meds ordered this encounter  Medications  . carvedilol (COREG) 12.5 MG tablet    Sig: Take 1 tablet (12.5 mg total) by mouth 2 (two) times daily with a meal.    Dispense:  60 tablet    Refill:  3  . glipiZIDE (GLUCOTROL) 10 MG tablet    Sig: Take 1 tablet (10 mg total) by mouth 2 (two) times daily before a meal.    Dispense:  60 tablet    Refill:  3  . insulin NPH-regular Human (NOVOLIN 70/30 RELION) (70-30) 100 UNIT/ML injection    Sig: Inject 20 Units into the skin 2 (two) times daily with a meal.    Dispense:  10 mL    Refill:  11  . losartan (COZAAR) 50 MG tablet    Sig: Take 1 tablet (50 mg total) by mouth daily.    Dispense:  90 tablet    Refill:  2  . metFORMIN (GLUCOPHAGE) 1000 MG tablet  Sig: Take 1 tablet (1,000 mg total) by mouth 2 (two) times daily with a meal.    Dispense:  60 tablet    Refill:  3    Immunization History  Administered Date(s) Administered  . Tdap 04/04/2013    Family History  Problem Relation Age of Onset  . Heart disease Mother     History  Substance Use Topics  . Smoking status: Never Smoker   . Smokeless tobacco: Not on file  . Alcohol Use: No    Review of Systems   As noted in HPI  Filed Vitals:   03/14/14 1228  BP: 130/80  Pulse:   Temp:   Resp:     Physical Exam  Physical Exam  Constitutional: No distress.  Eyes: EOM are normal. Pupils are equal, round, and reactive to light.  Neck: Neck supple.  Cardiovascular: Normal rate and regular rhythm.   Pulmonary/Chest: Breath sounds normal. No respiratory distress. He has no wheezes. He has no rales.  Musculoskeletal: He exhibits no edema.    CBC    Component Value Date/Time   WBC 7.9 11/14/2013 1519   RBC 4.93 11/14/2013 1519   HGB 14.4 11/14/2013 1519   HCT 40.5 11/14/2013 1519   PLT 125*  11/14/2013 1519   MCV 82.2 11/14/2013 1519   LYMPHSABS 2.4 11/14/2013 1519   MONOABS 0.6 11/14/2013 1519   EOSABS 0.2 11/14/2013 1519   BASOSABS 0.0 11/14/2013 1519    CMP     Component Value Date/Time   NA 143 11/14/2013 1519   K 3.8 11/14/2013 1519   CL 105 11/14/2013 1519   CO2 31 11/14/2013 1519   GLUCOSE 116* 11/14/2013 1519   BUN 12 11/14/2013 1519   CREATININE 0.76 11/14/2013 1519   CREATININE 0.88 07/07/2013 0223   CALCIUM 9.5 11/14/2013 1519   PROT 7.1 11/14/2013 1519   ALBUMIN 4.5 11/14/2013 1519   AST 25 11/14/2013 1519   ALT 30 11/14/2013 1519   ALKPHOS 75 11/14/2013 1519   BILITOT 0.5 11/14/2013 1519   GFRNONAA >89 11/14/2013 1519   GFRNONAA >90 07/07/2013 0223   GFRAA >89 11/14/2013 1519   GFRAA >90 07/07/2013 0223    Lab Results  Component Value Date/Time   CHOL 164 11/14/2013 03:19 PM    No components found for: HGA1C  Lab Results  Component Value Date/Time   AST 25 11/14/2013 03:19 PM    Assessment and Plan  Other specified diabetes mellitus without complications - Plan:  Results for orders placed or performed in visit on 03/14/14  Glucose (CBG)  Result Value Ref Range   POC Glucose 158 (A) 70 - 99 mg/dl  HgB H0QA1c  Result Value Ref Range   Hemoglobin A1C 6.6    Advised patient for diabetes planning, continue with metformin, Glucotrol, he will take the insulin 20 units twice a day, will repeat A1c in 3 months., insulin NPH-regular Human (NOVOLIN 70/30 RELION) (70-30) 100 UNIT/ML injection  Essential hypertension - Plan: blood pressure is controlled, continue with current medscarvedilol (COREG) 12.5 MG tablet, losartan (COZAAR) 50 MG tablet  High blood triglycerides Advised patient for low fat diet.    Health Maintenance  -Vaccinations:  Flu shot and pneumovax today   Return in about 3 months (around 06/13/2014) for diabetes, hypertension.  Doris CheadleADVANI, Tawona Filsinger, MD

## 2014-03-14 NOTE — Progress Notes (Signed)
Patient here for follow up on his diabetes Also needs medication refills

## 2014-04-18 ENCOUNTER — Ambulatory Visit: Payer: Self-pay | Attending: Internal Medicine

## 2014-04-18 ENCOUNTER — Other Ambulatory Visit: Payer: Self-pay | Admitting: Internal Medicine

## 2014-04-18 ENCOUNTER — Other Ambulatory Visit: Payer: Self-pay | Admitting: *Deleted

## 2014-04-18 MED ORDER — SPIRONOLACTONE 25 MG PO TABS: ORAL_TABLET | ORAL | Status: DC

## 2014-04-18 NOTE — Telephone Encounter (Signed)
Patient has come in today for an appointment with [facility]; patient was sent to the front to ask about script for spironolactone (ALDACTONE) 25 MG tablet; Patient is asking for a refill/Authorization from PCP; [facility]

## 2014-04-18 NOTE — Telephone Encounter (Signed)
Rx refill send to

## 2014-04-18 NOTE — Telephone Encounter (Signed)
Rx refill send to [facility] 

## 2014-05-01 ENCOUNTER — Other Ambulatory Visit: Payer: Self-pay

## 2014-05-01 DIAGNOSIS — E139 Other specified diabetes mellitus without complications: Secondary | ICD-10-CM

## 2014-05-01 MED ORDER — INSULIN NPH ISOPHANE & REGULAR (70-30) 100 UNIT/ML ~~LOC~~ SUSP: 20.0000 [IU] | Freq: Two times a day (BID) | SUBCUTANEOUS | Status: DC

## 2014-05-17 ENCOUNTER — Other Ambulatory Visit: Payer: Self-pay

## 2014-05-17 ENCOUNTER — Other Ambulatory Visit: Payer: Self-pay | Admitting: Internal Medicine

## 2014-05-17 MED ORDER — SPIRONOLACTONE 25 MG PO TABS: ORAL_TABLET | ORAL | Status: DC

## 2014-05-17 NOTE — Progress Notes (Unsigned)
Patient came to the office requesting a refill on his aldactone [facility]

## 2014-06-15 ENCOUNTER — Other Ambulatory Visit: Payer: Self-pay | Admitting: Internal Medicine

## 2014-06-15 ENCOUNTER — Other Ambulatory Visit: Payer: Self-pay

## 2014-06-15 MED ORDER — SPIRONOLACTONE 25 MG PO TABS: ORAL_TABLET | ORAL | Status: DC

## 2014-06-23 ENCOUNTER — Ambulatory Visit: Payer: Self-pay | Admitting: Internal Medicine

## 2014-06-27 ENCOUNTER — Ambulatory Visit: Payer: Self-pay | Attending: Internal Medicine | Admitting: Internal Medicine

## 2014-06-27 ENCOUNTER — Encounter: Payer: Self-pay | Admitting: Internal Medicine

## 2014-06-27 VITALS — BP 130/80 | HR 82 | Temp 98.0°F | Resp 16 | Wt 320.0 lb

## 2014-06-27 DIAGNOSIS — E781 Pure hyperglyceridemia: Secondary | ICD-10-CM | POA: Insufficient documentation

## 2014-06-27 DIAGNOSIS — E669 Obesity, unspecified: Secondary | ICD-10-CM | POA: Insufficient documentation

## 2014-06-27 DIAGNOSIS — E119 Type 2 diabetes mellitus without complications: Secondary | ICD-10-CM | POA: Insufficient documentation

## 2014-06-27 DIAGNOSIS — I10 Essential (primary) hypertension: Secondary | ICD-10-CM | POA: Insufficient documentation

## 2014-06-27 DIAGNOSIS — E089 Diabetes mellitus due to underlying condition without complications: Secondary | ICD-10-CM

## 2014-06-27 DIAGNOSIS — E139 Other specified diabetes mellitus without complications: Secondary | ICD-10-CM

## 2014-06-27 LAB — GLUCOSE, POCT (MANUAL RESULT ENTRY): POC Glucose: 113 mg/dl — AB (ref 70–99)

## 2014-06-27 LAB — COMPLETE METABOLIC PANEL WITH GFR
ALT: 26 U/L (ref 0–53)
AST: 22 U/L (ref 0–37)
Albumin: 4.4 g/dL (ref 3.5–5.2)
Alkaline Phosphatase: 75 U/L (ref 39–117)
BUN: 12 mg/dL (ref 6–23)
CO2: 31 mEq/L (ref 19–32)
CREATININE: 0.79 mg/dL (ref 0.50–1.35)
Calcium: 9.6 mg/dL (ref 8.4–10.5)
Chloride: 104 mEq/L (ref 96–112)
Glucose, Bld: 93 mg/dL (ref 70–99)
POTASSIUM: 4.5 meq/L (ref 3.5–5.3)
Sodium: 142 mEq/L (ref 135–145)
Total Bilirubin: 0.7 mg/dL (ref 0.2–1.2)
Total Protein: 7.7 g/dL (ref 6.0–8.3)

## 2014-06-27 LAB — LIPID PANEL
Cholesterol: 155 mg/dL (ref 0–200)
HDL: 23 mg/dL — ABNORMAL LOW (ref >=40)
LDL Cholesterol: 96 mg/dL (ref 0–99)
Total CHOL/HDL Ratio: 6.7 Ratio
Triglycerides: 182 mg/dL — ABNORMAL HIGH (ref <150)
VLDL: 36 mg/dL (ref 0–40)

## 2014-06-27 LAB — POCT GLYCOSYLATED HEMOGLOBIN (HGB A1C): Hemoglobin A1C: 6

## 2014-06-27 MED ORDER — SPIRONOLACTONE 25 MG PO TABS
ORAL_TABLET | ORAL | Status: DC
Start: 2014-06-27 — End: 2014-11-17

## 2014-06-27 MED ORDER — METFORMIN HCL 1000 MG PO TABS: 1000.0000 mg | ORAL_TABLET | Freq: Two times a day (BID) | ORAL | Status: DC

## 2014-06-27 MED ORDER — GLIPIZIDE 10 MG PO TABS: 10.0000 mg | ORAL_TABLET | Freq: Two times a day (BID) | ORAL | Status: DC

## 2014-06-27 MED ORDER — CARVEDILOL 12.5 MG PO TABS: 12.5000 mg | ORAL_TABLET | Freq: Two times a day (BID) | ORAL | Status: DC

## 2014-06-27 NOTE — Progress Notes (Signed)
Patient here for follow up on his diabetes Patient states he is good with his refills on his medictions

## 2014-06-27 NOTE — Patient Instructions (Signed)
Diabetes Mellitus and Food It is important for you to manage your blood sugar (glucose) level. Your blood glucose level can be greatly affected by what you eat. Eating healthier foods in the appropriate amounts throughout the day at about the same time each day will help you control your blood glucose level. It can also help slow or prevent worsening of your diabetes mellitus. Healthy eating may even help you improve the level of your blood pressure and reach or maintain a healthy weight.  HOW CAN FOOD AFFECT ME? Carbohydrates Carbohydrates affect your blood glucose level more than any other type of food. Your dietitian will help you determine how many carbohydrates to eat at each meal and teach you how to count carbohydrates. Counting carbohydrates is important to keep your blood glucose at a healthy level, especially if you are using insulin or taking certain medicines for diabetes mellitus. Alcohol Alcohol can cause sudden decreases in blood glucose (hypoglycemia), especially if you use insulin or take certain medicines for diabetes mellitus. Hypoglycemia can be a life-threatening condition. Symptoms of hypoglycemia (sleepiness, dizziness, and disorientation) are similar to symptoms of having too much alcohol.  If your health care provider has given you approval to drink alcohol, do so in moderation and use the following guidelines:  Women should not have more than one drink per day, and men should not have more than two drinks per day. One drink is equal to:  12 oz of beer.  5 oz of wine.  1 oz of hard liquor.  Do not drink on an empty stomach.  Keep yourself hydrated. Have water, diet soda, or unsweetened iced tea.  Regular soda, juice, and other mixers might contain a lot of carbohydrates and should be counted. WHAT FOODS ARE NOT RECOMMENDED? As you make food choices, it is important to remember that all foods are not the same. Some foods have fewer nutrients per serving than other  foods, even though they might have the same number of calories or carbohydrates. It is difficult to get your body what it needs when you eat foods with fewer nutrients. Examples of foods that you should avoid that are high in calories and carbohydrates but low in nutrients include:  Trans fats (most processed foods list trans fats on the Nutrition Facts label).  Regular soda.  Juice.  Candy.  Sweets, such as cake, pie, doughnuts, and cookies.  Fried foods. WHAT FOODS CAN I EAT? Have nutrient-rich foods, which will nourish your body and keep you healthy. The food you should eat also will depend on several factors, including:  The calories you need.  The medicines you take.  Your weight.  Your blood glucose level.  Your blood pressure level.  Your cholesterol level. You also should eat a variety of foods, including:  Protein, such as meat, poultry, fish, tofu, nuts, and seeds (lean animal proteins are best).  Fruits.  Vegetables.  Dairy products, such as milk, cheese, and yogurt (low fat is best).  Breads, grains, pasta, cereal, rice, and beans.  Fats such as olive oil, trans fat-free margarine, canola oil, avocado, and olives. DOES EVERYONE WITH DIABETES MELLITUS HAVE THE SAME MEAL PLAN? Because every person with diabetes mellitus is different, there is not one meal plan that works for everyone. It is very important that you meet with a dietitian who will help you create a meal plan that is just right for you. Document Released: 12/12/2004 Document Revised: 03/22/2013 Document Reviewed: 02/11/2013 ExitCare Patient Information 2015 [employer]. This   information is not intended to replace advice given to you by your health care provider. Make sure you discuss any questions you have with your health care provider. DASH Eating Plan DASH stands for "Dietary Approaches to Stop Hypertension." The DASH eating plan is a healthy eating plan that has been shown to reduce high  blood pressure (hypertension). Additional health benefits may include reducing the risk of type 2 diabetes mellitus, heart disease, and stroke. The DASH eating plan may also help with weight loss. WHAT DO I NEED TO KNOW ABOUT THE DASH EATING PLAN? For the DASH eating plan, you will follow these general guidelines:  Choose foods with a percent daily value for sodium of less than 5% (as listed on the food label).  Use salt-free seasonings or herbs instead of table salt or sea salt.  Check with your health care provider or pharmacist before using salt substitutes.  Eat lower-sodium products, often labeled as "lower sodium" or "no salt added."  Eat fresh foods.  Eat more vegetables, fruits, and low-fat dairy products.  Choose whole grains. Look for the word "whole" as the first word in the ingredient list.  Choose fish and skinless chicken or turkey more often than red meat. Limit fish, poultry, and meat to 6 oz (170 g) each day.  Limit sweets, desserts, sugars, and sugary drinks.  Choose heart-healthy fats.  Limit cheese to 1 oz (28 g) per day.  Eat more home-cooked food and less restaurant, buffet, and fast food.  Limit fried foods.  Cook foods using methods other than frying.  Limit canned vegetables. If you do use them, rinse them well to decrease the sodium.  When eating at a restaurant, ask that your food be prepared with less salt, or no salt if possible. WHAT FOODS CAN I EAT? Seek help from a dietitian for individual calorie needs. Grains Whole grain or whole wheat bread. Brown rice. Whole grain or whole wheat pasta. Quinoa, bulgur, and whole grain cereals. Low-sodium cereals. Corn or whole wheat flour tortillas. Whole grain cornbread. Whole grain crackers. Low-sodium crackers. Vegetables Fresh or frozen vegetables (raw, steamed, roasted, or grilled). Low-sodium or reduced-sodium tomato and vegetable juices. Low-sodium or reduced-sodium tomato sauce and paste. Low-sodium  or reduced-sodium canned vegetables.  Fruits All fresh, canned (in natural juice), or frozen fruits. Meat and Other Protein Products Ground beef (85% or leaner), grass-fed beef, or beef trimmed of fat. Skinless chicken or turkey. Ground chicken or turkey. Pork trimmed of fat. All fish and seafood. Eggs. Dried beans, peas, or lentils. Unsalted nuts and seeds. Unsalted canned beans. Dairy Low-fat dairy products, such as skim or 1% milk, 2% or reduced-fat cheeses, low-fat ricotta or cottage cheese, or plain low-fat yogurt. Low-sodium or reduced-sodium cheeses. Fats and Oils Tub margarines without trans fats. Light or reduced-fat mayonnaise and salad dressings (reduced sodium). Avocado. Safflower, olive, or canola oils. Natural peanut or almond butter. Other Unsalted popcorn and pretzels. The items listed above may not be a complete list of recommended foods or beverages. Contact your dietitian for more options. WHAT FOODS ARE NOT RECOMMENDED? Grains White bread. White pasta. White rice. Refined cornbread. Bagels and croissants. Crackers that contain trans fat. Vegetables Creamed or fried vegetables. Vegetables in a cheese sauce. Regular canned vegetables. Regular canned tomato sauce and paste. Regular tomato and vegetable juices. Fruits Dried fruits. Canned fruit in light or heavy syrup. Fruit juice. Meat and Other Protein Products Fatty cuts of meat. Ribs, chicken wings, bacon, sausage, bologna, salami, chitterlings, fatback, hot   dogs, bratwurst, and packaged luncheon meats. Salted nuts and seeds. Canned beans with salt. Dairy Whole or 2% milk, cream, half-and-half, and cream cheese. Whole-fat or sweetened yogurt. Full-fat cheeses or blue cheese. Nondairy creamers and whipped toppings. Processed cheese, cheese spreads, or cheese curds. Condiments Onion and garlic salt, seasoned salt, table salt, and sea salt. Canned and packaged gravies. Worcestershire sauce. Tartar sauce. Barbecue sauce.  Teriyaki sauce. Soy sauce, including reduced sodium. Steak sauce. Fish sauce. Oyster sauce. Cocktail sauce. Horseradish. Ketchup and mustard. Meat flavorings and tenderizers. Bouillon cubes. Hot sauce. Tabasco sauce. Marinades. Taco seasonings. Relishes. Fats and Oils Butter, stick margarine, lard, shortening, ghee, and bacon fat. Coconut, palm kernel, or palm oils. Regular salad dressings. Other Pickles and olives. Salted popcorn and pretzels. The items listed above may not be a complete list of foods and beverages to avoid. Contact your dietitian for more information. WHERE CAN I FIND MORE INFORMATION? National Heart, Lung, and [employer] Institute: www.nhlbi.nih.gov/health/health-topics/topics/dash/ Document Released: 03/06/2011 Document Revised: 08/01/2013 Document Reviewed: 01/19/2013 ExitCare Patient Information 2015 [employer]. This information is not intended to replace advice given to you by your health care provider. Make sure you discuss any questions you have with your health care provider.  

## 2014-06-27 NOTE — Progress Notes (Signed)
MRN: [id number] Name: Dave Arnold  Sex: male Age: 44 y.o. DOB: 1970/06/21  Allergies: Lisinopril  Chief Complaint  Patient presents with  . Follow-up    HPI: Patient is 44 y.o. male who has history of hypertension, diabetes, hypertriglyceridemia comes today for followup as per patient is taking insulin 20 units 2 times a day also taking metformin and Glucotrol, patient occasionally has hypoglycemic symptoms but has not checked his blood sugar when he has the symptoms, today noticed his hemoglobin A1c has trended down from 6.6% to 6.0%, currently denies any headache dizziness chest and shortness of breath.his manual blood pressure is 130/80  Past Medical History  Diagnosis Date  . Hypertension   . Obesity   . Heart failure of unknown type 06/2011  . Thrombocytopenia   . Diabetes mellitus without complication     History reviewed. No pertinent past surgical history.    Medication List       This list is accurate as of: 06/27/14  1:11 PM.  Always use your most recent med list.               aspirin EC 325 MG tablet  Take 325 mg by mouth daily. Takes once to twice per day.     carvedilol 12.5 MG tablet  Commonly known as:  COREG  Take 1 tablet (12.5 mg total) by mouth 2 (two) times daily with a meal.     glipiZIDE 10 MG tablet  Commonly known as:  GLUCOTROL  Take 1 tablet (10 mg total) by mouth 2 (two) times daily before a meal.     insulin NPH-regular Human (70-30) 100 UNIT/ML injection  Commonly known as:  NOVOLIN 70/30 RELION  Inject 20 Units into the skin 2 (two) times daily with a meal.     losartan 50 MG tablet  Commonly known as:  COZAAR  Take 1 tablet (50 mg total) by mouth daily.     metFORMIN 1000 MG tablet  Commonly known as:  GLUCOPHAGE  Take 1 tablet (1,000 mg total) by mouth 2 (two) times daily with a meal.     spironolactone 25 MG tablet  Commonly known as:  ALDACTONE  TAKE 1 TABLET BY MOUTH DAILY.        Meds ordered this  encounter  Medications  . carvedilol (COREG) 12.5 MG tablet    Sig: Take 1 tablet (12.5 mg total) by mouth 2 (two) times daily with a meal.    Dispense:  60 tablet    Refill:  3  . spironolactone (ALDACTONE) 25 MG tablet    Sig: TAKE 1 TABLET BY MOUTH DAILY.    Dispense:  30 tablet    Refill:  3  . metFORMIN (GLUCOPHAGE) 1000 MG tablet    Sig: Take 1 tablet (1,000 mg total) by mouth 2 (two) times daily with a meal.    Dispense:  60 tablet    Refill:  3  . glipiZIDE (GLUCOTROL) 10 MG tablet    Sig: Take 1 tablet (10 mg total) by mouth 2 (two) times daily before a meal.    Dispense:  60 tablet    Refill:  3    Immunization History  Administered Date(s) Administered  . Influenza,inj,Quad PF,36+ Mos 03/14/2014  . Pneumococcal Polysaccharide-23 03/14/2014  . Tdap 04/04/2013    Family History  Problem Relation Age of Onset  . Heart disease Mother     History  Substance Use Topics  . Smoking status: Never Smoker   .  Smokeless tobacco: Not on file  . Alcohol Use: No    Review of Systems   As noted in HPI  Filed Vitals:   06/27/14 1218  BP: 130/80  Pulse:   Temp:   Resp:     Physical Exam  Physical Exam  Constitutional: No distress.  Eyes: EOM are normal. Pupils are equal, round, and reactive to light.  Cardiovascular: Normal rate and regular rhythm.   Pulmonary/Chest: Breath sounds normal. No respiratory distress. He has no wheezes. He has no rales.  Abdominal: He exhibits no distension. There is no tenderness.  Musculoskeletal: He exhibits no edema.    CBC    Component Value Date/Time   WBC 7.9 11/14/2013 1519   RBC 4.93 11/14/2013 1519   HGB 14.4 11/14/2013 1519   HCT 40.5 11/14/2013 1519   PLT 125* 11/14/2013 1519   MCV 82.2 11/14/2013 1519   LYMPHSABS 2.4 11/14/2013 1519   MONOABS 0.6 11/14/2013 1519   EOSABS 0.2 11/14/2013 1519   BASOSABS 0.0 11/14/2013 1519    CMP     Component Value Date/Time   NA 143 11/14/2013 1519   K 3.8 11/14/2013  1519   CL 105 11/14/2013 1519   CO2 31 11/14/2013 1519   GLUCOSE 116* 11/14/2013 1519   BUN 12 11/14/2013 1519   CREATININE 0.76 11/14/2013 1519   CREATININE 0.88 07/07/2013 0223   CALCIUM 9.5 11/14/2013 1519   PROT 7.1 11/14/2013 1519   ALBUMIN 4.5 11/14/2013 1519   AST 25 11/14/2013 1519   ALT 30 11/14/2013 1519   ALKPHOS 75 11/14/2013 1519   BILITOT 0.5 11/14/2013 1519   GFRNONAA >89 11/14/2013 1519   GFRNONAA >90 07/07/2013 0223   GFRAA >89 11/14/2013 1519   GFRAA >90 07/07/2013 0223    Lab Results  Component Value Date/Time   CHOL 164 11/14/2013 03:19 PM    No components found for: HGA1C  Lab Results  Component Value Date/Time   AST 25 11/14/2013 03:19 PM    Assessment and Plan  Other specified diabetes mellitus without complications - Plan:  Results for orders placed or performed in visit on 06/27/14  Glucose (CBG)  Result Value Ref Range   POC Glucose 113.0 (A) 70 - 99 mg/dl  HgB Z6X  Result Value Ref Range   Hemoglobin A1C 6.0   diabetes is too tightly controlled  since patient reports sometimes hypoglycemic symptoms, I have advised patient to reduce the dose of insulin to 18 units twice a day continue with metformin and Glucotrol, will recheck A1c in 3 months.  Essential hypertension - Plan:pressure is controlled continue with current meds, repeat blood chemistry COMPLETE METABOLIC PANEL WITH GFR, carvedilol (COREG) 12.5 MG tablet, spironolactone (ALDACTONE) 25 MG tablet  High blood triglycerides - Plan: will check Lipid panel, consider starting on statins  Diabetes mellitus due to underlying condition without complications - Plan: metFORMIN (GLUCOPHAGE) 1000 MG tablet, glipiZIDE (GLUCOTROL) 10 MG tablet  Obesity  counseled patient for diet and exercise  Health Maintenance  -Vaccinations:  Up-to-date with flu shot and Pneumovax  Return in about 3 months (around 09/27/2014) for diabetes, hypertension.   This note has been created with [employer], [employer]. Any transcriptional errors are unintentional.    Doris Cheadle, MD

## 2014-07-04 ENCOUNTER — Telehealth: Payer: Self-pay

## 2014-07-04 NOTE — Telephone Encounter (Signed)
Left message

## 2014-07-07 ENCOUNTER — Telehealth: Payer: Self-pay

## 2014-07-07 NOTE — Telephone Encounter (Signed)
-----   Message from Doris Cheadleeepak Advani, MD sent at 06/28/2014  1:02 PM EDT ----- Call and let the patient know that his blood work shows improvement in triglyceride level, but still elevated, advise patient for low fat diet.

## 2014-07-07 NOTE — Telephone Encounter (Signed)
Patient not available Left message on voice mail to return our call 

## 2014-08-22 ENCOUNTER — Ambulatory Visit: Payer: Self-pay | Attending: Family Medicine | Admitting: Family Medicine

## 2014-08-22 VITALS — BP 148/88 | HR 62 | Wt 314.2 lb

## 2014-08-22 DIAGNOSIS — M25571 Pain in right ankle and joints of right foot: Secondary | ICD-10-CM

## 2014-08-22 MED ORDER — TRAMADOL HCL 50 MG PO TABS: 50.0000 mg | ORAL_TABLET | Freq: Three times a day (TID) | ORAL | Status: DC | PRN

## 2014-08-22 NOTE — Progress Notes (Signed)
Subjective:     Patient ID: Dave PattyKevin R Arnold, male   DOB: 09-12-1970, 44 y.o.   MRN: [id number]  HPI   Patient presents today with pain and swelling in his right foot. He is aware of not discrete injury but was on his feet almost all day [place] [employer] [place] Saturday and this pain and swelling started after that. He left ankle is also painful.He is on several medications including a diurectic. He thinks he needs a fluid pill or antibiotic.   Review of Systems   Denies fever, chills, Denies chest pain or shortness of breath. Denies cough Denies musculoskeletal pain except as mentioned in HPI.     Objective:   Physical Exam   Alert, oriented and appropriate Skin warm and dry Lungs clear to auscultation Heart sounds regular There is localized swelling to the lateral right foot and ankle. There is no redness or warmth. There is tenderness.  There is minor swelling of the area around the left ankle with mild tenderness. 1+ pedal pulses.     Assessment:  Foot pain and swelling on right, minorly on left          Plan:     I am reluctant to prescribe NSAID due to his BP I am prescribing Tramadol 50, tid prn, #30 Have advised rest, heat and elevation To return if no improving by Friday.

## 2014-08-22 NOTE — Patient Instructions (Addendum)
Stay off feet as much as possible for the next week. Warm soaks 2 times a day. Tramadol for moderate to severe pain prn, up to 3 times a day, every 8 hours May also use tylenol per bottle instructions prn. Follow-up here if not improving by Friday

## 2014-09-18 ENCOUNTER — Other Ambulatory Visit: Payer: Self-pay | Admitting: Internal Medicine

## 2014-11-17 ENCOUNTER — Other Ambulatory Visit: Payer: Self-pay | Admitting: Internal Medicine

## 2014-11-17 DIAGNOSIS — E0865 Diabetes mellitus due to underlying condition with hyperglycemia: Secondary | ICD-10-CM

## 2014-11-17 MED ORDER — "INSULIN SYRINGE-NEEDLE U-100 30G X 5/16"" 0.5 ML MISC": 1.0000 | Freq: Two times a day (BID) | Status: DC

## 2014-11-17 NOTE — Telephone Encounter (Signed)
Patient is requesting a refill on all medications and syringe. Please follow up.

## 2014-11-17 NOTE — Telephone Encounter (Signed)
Patient called requesting a medication refill for Spironolactone. Please follow up.

## 2014-12-05 ENCOUNTER — Emergency Department (HOSPITAL_COMMUNITY)
Admission: EM | Admit: 2014-12-05 | Discharge: 2014-12-05 | Disposition: A | Discharge status: Home / Self Care | Payer: Self-pay | Attending: Emergency Medicine | Admitting: Emergency Medicine

## 2014-12-05 ENCOUNTER — Encounter (HOSPITAL_COMMUNITY): Payer: Self-pay | Admitting: Emergency Medicine

## 2014-12-05 DIAGNOSIS — T7849XA Other allergy, initial encounter: Secondary | ICD-10-CM | POA: Insufficient documentation

## 2014-12-05 DIAGNOSIS — Z794 Long term (current) use of insulin: Secondary | ICD-10-CM | POA: Insufficient documentation

## 2014-12-05 DIAGNOSIS — Y9389 Activity, other specified: Secondary | ICD-10-CM | POA: Insufficient documentation

## 2014-12-05 DIAGNOSIS — I10 Essential (primary) hypertension: Secondary | ICD-10-CM | POA: Insufficient documentation

## 2014-12-05 DIAGNOSIS — E669 Obesity, unspecified: Secondary | ICD-10-CM | POA: Insufficient documentation

## 2014-12-05 DIAGNOSIS — Y9289 Other specified places as the place of occurrence of the external cause: Secondary | ICD-10-CM | POA: Insufficient documentation

## 2014-12-05 DIAGNOSIS — Y998 Other external cause status: Secondary | ICD-10-CM | POA: Insufficient documentation

## 2014-12-05 DIAGNOSIS — E119 Type 2 diabetes mellitus without complications: Secondary | ICD-10-CM | POA: Insufficient documentation

## 2014-12-05 DIAGNOSIS — Z7982 Long term (current) use of aspirin: Secondary | ICD-10-CM | POA: Insufficient documentation

## 2014-12-05 DIAGNOSIS — Z79899 Other long term (current) drug therapy: Secondary | ICD-10-CM | POA: Insufficient documentation

## 2014-12-05 DIAGNOSIS — Z862 Personal history of diseases of the blood and blood-forming organs and certain disorders involving the immune mechanism: Secondary | ICD-10-CM | POA: Insufficient documentation

## 2014-12-05 DIAGNOSIS — T7840XA Allergy, unspecified, initial encounter: Secondary | ICD-10-CM

## 2014-12-05 DIAGNOSIS — X58XXXA Exposure to other specified factors, initial encounter: Secondary | ICD-10-CM | POA: Insufficient documentation

## 2014-12-05 LAB — BASIC METABOLIC PANEL
Anion gap: 10 (ref 5–15)
BUN: 16 mg/dL (ref 6–20)
CHLORIDE: 105 mmol/L (ref 101–111)
CO2: 25 mmol/L (ref 22–32)
Calcium: 9 mg/dL (ref 8.9–10.3)
Creatinine, Ser: 0.88 mg/dL (ref 0.61–1.24)
Glucose, Bld: 110 mg/dL — ABNORMAL HIGH (ref 65–99)
POTASSIUM: 3.7 mmol/L (ref 3.5–5.1)
SODIUM: 140 mmol/L (ref 135–145)

## 2014-12-05 LAB — CBG MONITORING, ED
GLUCOSE-CAPILLARY: 96 mg/dL (ref 65–99)
Glucose-Capillary: 109 mg/dL — ABNORMAL HIGH (ref 65–99)

## 2014-12-05 LAB — CBC WITH DIFFERENTIAL/PLATELET
BASOS ABS: 0 10*3/uL (ref 0.0–0.1)
Basophils Relative: 0 % (ref 0–1)
EOS ABS: 0.1 10*3/uL (ref 0.0–0.7)
EOS PCT: 1 % (ref 0–5)
HCT: 48.2 % (ref 39.0–52.0)
HEMOGLOBIN: 16.9 g/dL (ref 13.0–17.0)
LYMPHS ABS: 2.3 10*3/uL (ref 0.7–4.0)
LYMPHS PCT: 17 % (ref 12–46)
MCH: 30.7 pg (ref 26.0–34.0)
MCHC: 35.1 g/dL (ref 30.0–36.0)
MCV: 87.6 fL (ref 78.0–100.0)
Monocytes Absolute: 1.2 10*3/uL — ABNORMAL HIGH (ref 0.1–1.0)
Monocytes Relative: 8 % (ref 3–12)
NEUTROS PCT: 74 % (ref 43–77)
Neutro Abs: 10.2 10*3/uL — ABNORMAL HIGH (ref 1.7–7.7)
PLATELETS: 148 10*3/uL — AB (ref 150–400)
RBC: 5.5 MIL/uL (ref 4.22–5.81)
RDW: 13.1 % (ref 11.5–15.5)
WBC: 13.7 10*3/uL — AB (ref 4.0–10.5)

## 2014-12-05 MED ORDER — DIPHENHYDRAMINE HCL 25 MG PO CAPS
50.0000 mg | ORAL_CAPSULE | Freq: Once | ORAL | Status: AC
  Administered 2014-12-05: 50 mg via ORAL
  Filled 2014-12-05: qty 2

## 2014-12-05 MED ORDER — ACETAMINOPHEN 500 MG PO TABS
1000.0000 mg | ORAL_TABLET | Freq: Once | ORAL | Status: AC
  Administered 2014-12-05: 1000 mg via ORAL
  Filled 2014-12-05: qty 2

## 2014-12-05 MED ORDER — FAMOTIDINE 20 MG PO TABS
20.0000 mg | ORAL_TABLET | Freq: Once | ORAL | Status: AC
  Administered 2014-12-05: 20 mg via ORAL
  Filled 2014-12-05: qty 1

## 2014-12-05 MED ORDER — ONDANSETRON 4 MG PO TBDP
4.0000 mg | ORAL_TABLET | Freq: Once | ORAL | Status: AC
  Administered 2014-12-05: 4 mg via ORAL
  Filled 2014-12-05: qty 1

## 2014-12-05 MED ORDER — PREDNISONE 20 MG PO TABS
60.0000 mg | ORAL_TABLET | Freq: Once | ORAL | Status: AC
  Administered 2014-12-05: 60 mg via ORAL
  Filled 2014-12-05: qty 3

## 2014-12-05 MED ORDER — PREDNISONE 20 MG PO TABS: ORAL_TABLET | ORAL | Status: DC

## 2014-12-05 MED ORDER — EPINEPHRINE 0.3 MG/0.3ML IJ SOAJ: 0.3000 mg | Freq: Once | INTRAMUSCULAR | Status: AC

## 2014-12-05 MED ORDER — IBUPROFEN 800 MG PO TABS
800.0000 mg | ORAL_TABLET | Freq: Once | ORAL | Status: AC
  Administered 2014-12-05: 800 mg via ORAL
  Filled 2014-12-05: qty 1

## 2014-12-05 NOTE — Discharge Instructions (Signed)
Take benadryl every 6 hours while awake.  Allergies Allergies may happen from anything your body is sensitive to. This may be food, medicines, pollens, chemicals, and nearly anything around you in everyday life that produces allergens. An allergen is anything that causes an allergy producing substance. Heredity is often a factor in causing these problems. This means you may have some of the same allergies as your parents. Food allergies happen in all age groups. Food allergies are some of the most severe and life threatening. Some common food allergies are cow's milk, seafood, eggs, nuts, wheat, and soybeans. SYMPTOMS   Swelling around the mouth.  An itchy red rash or hives.  Vomiting or diarrhea.  Difficulty breathing. SEVERE ALLERGIC REACTIONS ARE LIFE-THREATENING. This reaction is called anaphylaxis. It can cause the mouth and throat to swell and cause difficulty with breathing and swallowing. In severe reactions only a trace amount of food (for example, peanut oil in a salad) may cause death within seconds. Seasonal allergies occur in all age groups. These are seasonal because they usually occur during the same season every year. They may be a reaction to molds, grass pollens, or tree pollens. Other causes of problems are house dust mite allergens, pet dander, and mold spores. The symptoms often consist of nasal congestion, a runny itchy nose associated with sneezing, and tearing itchy eyes. There is often an associated itching of the mouth and ears. The problems happen when you come in contact with pollens and other allergens. Allergens are the particles in the air that the body reacts to with an allergic reaction. This causes you to release allergic antibodies. Through a chain of events, these eventually cause you to release histamine into the blood stream. Although it is meant to be protective to the body, it is this release that causes your discomfort. This is why you were given  anti-histamines to feel better. If you are unable to pinpoint the offending allergen, it may be determined by skin or blood testing. Allergies cannot be cured but can be controlled with medicine. Hay fever is a collection of all or some of the seasonal allergy problems. It may often be treated with simple over-the-counter medicine such as diphenhydramine. Take medicine as directed. Do not drink alcohol or drive while taking this medicine. Check with your caregiver or package insert for child dosages. If these medicines are not effective, there are many new medicines your caregiver can prescribe. Stronger medicine such as nasal spray, eye drops, and corticosteroids may be used if the first things you try do not work well. Other treatments such as immunotherapy or desensitizing injections can be used if all else fails. Follow up with your caregiver if problems continue. These seasonal allergies are usually not life threatening. They are generally more of a nuisance that can often be handled using medicine. HOME CARE INSTRUCTIONS   If unsure what causes a reaction, keep a diary of foods eaten and symptoms that follow. Avoid foods that cause reactions.  If hives or rash are present:  Take medicine as directed.  You may use an over-the-counter antihistamine (diphenhydramine) for hives and itching as needed.  Apply cold compresses (cloths) to the skin or take baths in cool water. Avoid hot baths or showers. Heat will make a rash and itching worse.  If you are severely allergic:  Following a treatment for a severe reaction, hospitalization is often required for closer follow-up.  Wear a medic-alert bracelet or necklace stating the allergy.  You and your family  must learn how to give adrenaline or use an anaphylaxis kit.  If you have had a severe reaction, always carry your anaphylaxis kit or EpiPen with you. Use this medicine as directed by your caregiver if a severe reaction is occurring. Failure  to do so could have a fatal outcome. SEEK MEDICAL CARE IF:  You suspect a food allergy. Symptoms generally happen within 30 minutes of eating a food.  Your symptoms have not gone away within 2 days or are getting worse.  You develop new symptoms.  You want to retest yourself or your child with a food or drink you think causes an allergic reaction. Never do this if an anaphylactic reaction to that food or drink has happened before. Only do this under the care of a caregiver. SEEK IMMEDIATE MEDICAL CARE IF:   You have difficulty breathing, are wheezing, or have a tight feeling in your chest or throat.  You have a swollen mouth, or you have hives, swelling, or itching all over your body.  You have had a severe reaction that has responded to your anaphylaxis kit or an EpiPen. These reactions may return when the medicine has worn off. These reactions should be considered life threatening. MAKE SURE YOU:   Understand these instructions.  Will watch your condition.  Will get help right away if you are not doing well or get worse. Document Released: 06/10/2002 Document Revised: 07/12/2012 Document Reviewed: 11/15/2007 [facility] Patient Information 2015 [place], [place]. This information is not intended to replace advice given to you by your health care provider. Make sure you discuss any questions you have with your health care provider.

## 2014-12-05 NOTE — ED Notes (Signed)
Per pt, states he woke up this am with whelps all over his body-states he fell on Friday and skinned his knee and put peroxide on it, doesn't know if he is allergic to it or now-states he took benadryl with no relief

## 2014-12-05 NOTE — ED Notes (Signed)
Awake. Verbally responsive. A/O x4. Resp even and unlabored. No audible adventitious breath sounds noted. ABC's intact.  

## 2014-12-05 NOTE — ED Provider Notes (Signed)
CSN: [id number]     Arrival date & time 12/05/14  1444 History   First MD Initiated Contact with Patient 12/05/14 1458     Chief Complaint  Patient presents with  . Allergic Reaction     (Consider location/radiation/quality/duration/timing/severity/associated sxs/prior Treatment) Patient is a 44 y.o. male presenting with allergic reaction. The history is provided by the patient.  Allergic Reaction Presenting symptoms: itching and rash   Severity:  Moderate Prior allergic episodes:  Unable to specify Context: new detergents/soaps   Relieved by:  Nothing Worsened by:  Nothing tried Ineffective treatments:  Antihistamines  44 yo M with a chief complaint of allergic reaction. Less I patient used his wife's soap will give this morning with itchiness to the areas that he had washed. Tried a Benadryl with no relief. Patient becoming increasingly anxious denies shortness of breath denies throat swelling denies nausea or vomiting.  Past Medical History  Diagnosis Date  . Hypertension   . Obesity   . Heart failure of unknown type 06/2011  . Thrombocytopenia   . Diabetes mellitus without complication    History reviewed. No pertinent past surgical history. Family History  Problem Relation Age of Onset  . Heart disease Mother    Social History  Substance Use Topics  . Smoking status: Never Smoker   . Smokeless tobacco: None  . Alcohol Use: No    Review of Systems  Constitutional: Negative for fever and chills.  HENT: Negative for congestion and facial swelling.   Eyes: Negative for discharge and visual disturbance.  Respiratory: Negative for shortness of breath.   Cardiovascular: Negative for chest pain and palpitations.  Gastrointestinal: Negative for vomiting, abdominal pain and diarrhea.  Musculoskeletal: Negative for myalgias and arthralgias.  Skin: Positive for itching and rash. Negative for color change.  Neurological: Negative for tremors, syncope and headaches.   Psychiatric/Behavioral: Negative for confusion and dysphoric mood.      Allergies  Lisinopril  Home Medications   Prior to Admission medications   Medication Sig Start Date End Date Taking? Authorizing Provider  acetaminophen (TYLENOL) 500 MG tablet Take 1,000 mg by mouth every 6 (six) hours as needed for moderate pain.   Yes Historical Provider, MD  aspirin EC 81 MG tablet Take 162 mg by mouth daily.   Yes Historical Provider, MD  carvedilol (COREG) 12.5 MG tablet Take 1 tablet (12.5 mg total) by mouth 2 (two) times daily with a meal. 06/27/14  Yes Deepak Advani, MD  glipiZIDE (GLUCOTROL) 10 MG tablet Take 1 tablet (10 mg total) by mouth 2 (two) times daily before a meal. 06/27/14  Yes Deepak Advani, MD  insulin NPH-regular Human (NOVOLIN 70/30 RELION) (70-30) 100 UNIT/ML injection Inject 20 Units into the skin 2 (two) times daily with a meal. 05/01/14  Yes Deepak Advani, MD  Insulin Syringe-Needle U-100 (TRUEPLUS INSULIN SYRINGE) 30G X 5/16" 0.5 ML MISC 1 each by Does not apply route 2 (two) times daily. 11/17/14  Yes Quentin Angst, MD  losartan (COZAAR) 50 MG tablet Take 1 tablet (50 mg total) by mouth daily. 03/14/14  Yes Doris Cheadle, MD  metFORMIN (GLUCOPHAGE) 1000 MG tablet Take 1 tablet (1,000 mg total) by mouth 2 (two) times daily with a meal. 06/27/14  Yes Deepak Advani, MD  Naproxen Sod-Diphenhydramine (ALEVE PM) 220-25 MG TABS Take 2 tablets by mouth at bedtime as needed (pain/sleep).   Yes Historical Provider, MD  spironolactone (ALDACTONE) 25 MG tablet TAKE 1 TABLET BY MOUTH DAILY. 11/17/14  Yes Olugbemiga  Annitta Needs, MD  traMADol (ULTRAM) 50 MG tablet Take 1 tablet (50 mg total) by mouth every 8 (eight) hours as needed. 08/22/14  Yes Henrietta Hoover, NP  EPINEPHrine (EPIPEN 2-PAK) 0.3 mg/0.3 mL IJ SOAJ injection Inject 0.3 mLs (0.3 mg total) into the muscle once. 12/05/14   Melene Plan, DO  predniSONE (DELTASONE) 20 MG tablet 2 tabs po daily x 3 days 12/05/14   Melene Plan, DO   BP  148/71 mmHg  Pulse 74  Temp(Src) 98.1 F (36.7 C) (Oral)  Resp 18  SpO2 99% Physical Exam  Constitutional: He is oriented to person, place, and time. He appears well-developed and well-nourished.  HENT:  Head: Normocephalic and atraumatic.  Eyes: EOM are normal. Pupils are equal, round, and reactive to light.  Neck: Normal range of motion. Neck supple. No JVD present.  Cardiovascular: Normal rate and regular rhythm.  Exam reveals no gallop and no friction rub.   No murmur heard. Pulmonary/Chest: No respiratory distress. He has no wheezes.  Abdominal: He exhibits no distension. There is no rebound and no guarding.  Musculoskeletal: Normal range of motion.  Neurological: He is alert and oriented to person, place, and time.  Skin: Rash (diffuse raised areas of erythema blanching. Pruritic.  ) noted. No pallor.  Psychiatric: He has a normal mood and affect. His behavior is normal.    ED Course  Procedures (including critical care time) Labs Review Labs Reviewed  BASIC METABOLIC PANEL - Abnormal; Notable for the following:    Glucose, Bld 110 (*)    All other components within normal limits  CBC WITH DIFFERENTIAL/PLATELET - Abnormal; Notable for the following:    WBC 13.7 (*)    Platelets 148 (*)    Neutro Abs 10.2 (*)    Monocytes Absolute 1.2 (*)    All other components within normal limits  CBG MONITORING, ED - Abnormal; Notable for the following:    Glucose-Capillary 109 (*)    All other components within normal limits  CBG MONITORING, ED    Imaging Review No results found. I have personally reviewed and evaluated these images and lab results as part of my medical decision-making.   EKG Interpretation None      MDM   Final diagnoses:  Allergic reaction, initial encounter    43 yo M with a chief complaint allergic reaction. Itchy  rash sparing mucous membranes. No new medications. Patient has used new soap last night. Areas that were reachable by him are covered  in the rash. No signs of anaphylaxis. Will treat with steroids and Benadryl and Pepcid  Symptoms improve on the ED. Reassessed patient feeling better will discharge home. Burst of steroids.   I have discussed the diagnosis/risks/treatment options with the patient and believe the pt to be eligible for discharge home to follow-up with PCP/allergy. We also discussed returning to the ED immediately if new or worsening sx occur. We discussed the sx which are most concerning (e.g., sudden worsening symptoms) that necessitate immediate return. Medications administered to the patient during their visit and any new prescriptions provided to the patient are listed below.  Medications given during this visit Medications  diphenhydrAMINE (BENADRYL) capsule 50 mg (50 mg Oral Given 12/05/14 1540)  famotidine (PEPCID) tablet 20 mg (20 mg Oral Given 12/05/14 1540)  predniSONE (DELTASONE) tablet 60 mg (60 mg Oral Given 12/05/14 1540)  ondansetron (ZOFRAN-ODT) disintegrating tablet 4 mg (4 mg Oral Given 12/05/14 1619)  acetaminophen (TYLENOL) tablet 1,000 mg (1,000 mg Oral  Given 12/05/14 1703)  ibuprofen (ADVIL,MOTRIN) tablet 800 mg (800 mg Oral Given 12/05/14 1703)    Discharge Medication List as of 12/05/2014  4:49 PM    START taking these medications   Details  EPINEPHrine (EPIPEN 2-PAK) 0.3 mg/0.3 mL IJ SOAJ injection Inject 0.3 mLs (0.3 mg total) into the muscle once., Starting 12/05/2014, Print    predniSONE (DELTASONE) 20 MG tablet 2 tabs po daily x 3 days, Print         The patient appears reasonably screen and/or stabilized for discharge and I doubt any other medical condition or other [facility] requiring further screening, evaluation, or treatment in the ED at this time prior to discharge.    Melene Plan, DO 12/05/14 2332

## 2014-12-18 ENCOUNTER — Other Ambulatory Visit: Payer: Self-pay | Admitting: Internal Medicine

## 2014-12-18 ENCOUNTER — Telehealth: Payer: Self-pay

## 2014-12-18 ENCOUNTER — Telehealth: Payer: Self-pay | Admitting: Internal Medicine

## 2014-12-18 DIAGNOSIS — E139 Other specified diabetes mellitus without complications: Secondary | ICD-10-CM

## 2014-12-18 DIAGNOSIS — E089 Diabetes mellitus due to underlying condition without complications: Secondary | ICD-10-CM

## 2014-12-18 DIAGNOSIS — I10 Essential (primary) hypertension: Secondary | ICD-10-CM

## 2014-12-18 MED ORDER — CARVEDILOL 12.5 MG PO TABS: 12.5000 mg | ORAL_TABLET | Freq: Two times a day (BID) | ORAL | Status: DC

## 2014-12-18 MED ORDER — INSULIN NPH ISOPHANE & REGULAR (70-30) 100 UNIT/ML ~~LOC~~ SUSP: 20.0000 [IU] | Freq: Two times a day (BID) | SUBCUTANEOUS | Status: DC

## 2014-12-18 MED ORDER — SPIRONOLACTONE 25 MG PO TABS: 25.0000 mg | ORAL_TABLET | Freq: Every day | ORAL | Status: DC

## 2014-12-18 MED ORDER — ALCOHOL PADS 70 % PADS
MEDICATED_PAD | Status: DC
Start: 2014-12-18 — End: 2014-12-26

## 2014-12-18 MED ORDER — GLIPIZIDE 10 MG PO TABS: 10.0000 mg | ORAL_TABLET | Freq: Two times a day (BID) | ORAL | Status: DC

## 2014-12-18 MED ORDER — LOSARTAN POTASSIUM 50 MG PO TABS: 50.0000 mg | ORAL_TABLET | Freq: Every day | ORAL | Status: DC

## 2014-12-18 MED ORDER — METFORMIN HCL 1000 MG PO TABS: 1000.0000 mg | ORAL_TABLET | Freq: Two times a day (BID) | ORAL | Status: DC

## 2014-12-18 NOTE — Telephone Encounter (Signed)
Patient called requesting refills on his medications One month supply given Patient needs to schedule an appt for future refills

## 2014-12-18 NOTE — Telephone Encounter (Signed)
Patient called requesting med refill on current medication. Please f/u

## 2014-12-26 ENCOUNTER — Encounter: Payer: Self-pay | Admitting: Family Medicine

## 2014-12-26 ENCOUNTER — Ambulatory Visit: Payer: Self-pay | Attending: Family Medicine | Admitting: Family Medicine

## 2014-12-26 VITALS — BP 127/76 | HR 71 | Temp 98.8°F | Resp 16 | Ht 72.0 in | Wt 309.0 lb

## 2014-12-26 DIAGNOSIS — I5020 Unspecified systolic (congestive) heart failure: Secondary | ICD-10-CM | POA: Insufficient documentation

## 2014-12-26 DIAGNOSIS — M545 Low back pain: Secondary | ICD-10-CM | POA: Insufficient documentation

## 2014-12-26 DIAGNOSIS — I10 Essential (primary) hypertension: Secondary | ICD-10-CM

## 2014-12-26 DIAGNOSIS — E0865 Diabetes mellitus due to underlying condition with hyperglycemia: Secondary | ICD-10-CM

## 2014-12-26 DIAGNOSIS — G8929 Other chronic pain: Secondary | ICD-10-CM | POA: Insufficient documentation

## 2014-12-26 DIAGNOSIS — E139 Other specified diabetes mellitus without complications: Secondary | ICD-10-CM

## 2014-12-26 DIAGNOSIS — Z79899 Other long term (current) drug therapy: Secondary | ICD-10-CM | POA: Insufficient documentation

## 2014-12-26 DIAGNOSIS — Z7982 Long term (current) use of aspirin: Secondary | ICD-10-CM | POA: Insufficient documentation

## 2014-12-26 DIAGNOSIS — Z0000 Encounter for general adult medical examination without abnormal findings: Secondary | ICD-10-CM

## 2014-12-26 DIAGNOSIS — I5022 Chronic systolic (congestive) heart failure: Secondary | ICD-10-CM

## 2014-12-26 DIAGNOSIS — E119 Type 2 diabetes mellitus without complications: Secondary | ICD-10-CM | POA: Insufficient documentation

## 2014-12-26 DIAGNOSIS — E089 Diabetes mellitus due to underlying condition without complications: Secondary | ICD-10-CM

## 2014-12-26 DIAGNOSIS — Z794 Long term (current) use of insulin: Secondary | ICD-10-CM | POA: Insufficient documentation

## 2014-12-26 LAB — POCT GLYCOSYLATED HEMOGLOBIN (HGB A1C): HEMOGLOBIN A1C: 6.3

## 2014-12-26 LAB — GLUCOSE, POCT (MANUAL RESULT ENTRY): POC Glucose: 130 mg/dl — AB (ref 70–99)

## 2014-12-26 MED ORDER — GLIPIZIDE 10 MG PO TABS: 10.0000 mg | ORAL_TABLET | Freq: Two times a day (BID) | ORAL | Status: DC

## 2014-12-26 MED ORDER — TRAMADOL HCL 50 MG PO TABS: 50.0000 mg | ORAL_TABLET | Freq: Every day | ORAL | Status: DC

## 2014-12-26 MED ORDER — METFORMIN HCL 1000 MG PO TABS: 1000.0000 mg | ORAL_TABLET | Freq: Two times a day (BID) | ORAL | Status: DC

## 2014-12-26 MED ORDER — INSULIN NPH ISOPHANE & REGULAR (70-30) 100 UNIT/ML ~~LOC~~ SUSP: 20.0000 [IU] | Freq: Two times a day (BID) | SUBCUTANEOUS | Status: DC

## 2014-12-26 MED ORDER — CARVEDILOL 12.5 MG PO TABS: 12.5000 mg | ORAL_TABLET | Freq: Two times a day (BID) | ORAL | Status: DC

## 2014-12-26 MED ORDER — LOSARTAN POTASSIUM 50 MG PO TABS: 50.0000 mg | ORAL_TABLET | Freq: Every day | ORAL | Status: DC

## 2014-12-26 MED ORDER — "INSULIN SYRINGE-NEEDLE U-100 30G X 5/16"" 0.5 ML MISC": 1.0000 | Freq: Two times a day (BID) | Status: DC

## 2014-12-26 NOTE — Patient Instructions (Addendum)
Dave Arnold,  Thank you for coming in today. It was a pleasure meeting you. I look forward to being your primary doctor.  You are doing a great job with your diabetes  Check out this website for diet guidelines  http://www.ruled.me/  Diabetes blood sugar goals  Fasting (in AM before breakfast, 8 hrs of no eating or drinking (except water or unsweetened coffee or tea): 90-110 2 hrs after meals: < 160,   No low sugars: nothing < 70   2. CHF:  F/u ECHO ordered   F/u in  4 weeks with pharmacy team for blood sugar review with the intention to decrease your insulin doses if your sugars remain well controlled  F/u with me in 3 months for diabetes, HTN and CHF   Dr. Armen Pickup

## 2014-12-26 NOTE — Progress Notes (Signed)
F/U DM Glucose been running around 120  Stated was in ED due to allergic reaction to unknown source

## 2014-12-26 NOTE — Progress Notes (Signed)
Patient ID: Dave Arnold, male   DOB: 1970-06-09, 44 y.o.   MRN: [id number]   Subjective:  Patient ID: Dave Arnold, male    DOB: [birth year]  Age: 44 y.o. MRN: [id number]  CC: Follow-up and Diabetes   HPI Dave Arnold presents for   1. CHRONIC DIABETES  Disease Monitoring  Blood Sugar Ranges: well controlled   Polyuria: no   Visual problems: no   Medication Compliance: yes  Medication Side Effects  Hypoglycemia: no   2. ED f/u: was seen in ED on 12/05/14 for allergic reaction. Trigger is unknown.   Social History  Substance Use Topics  . Smoking status: Never Smoker   . Smokeless tobacco: Not on file  . Alcohol Use: No    Outpatient Prescriptions Prior to Visit  Medication Sig Dispense Refill  . acetaminophen (TYLENOL) 500 MG tablet Take 1,000 mg by mouth every 6 (six) hours as needed for moderate pain.    Marland Kitchen aspirin EC 81 MG tablet Take 162 mg by mouth daily.    . carvedilol (COREG) 12.5 MG tablet Take 1 tablet (12.5 mg total) by mouth 2 (two) times daily with a meal. 60 tablet 0  . EPINEPHrine (EPIPEN 2-PAK) 0.3 mg/0.3 mL IJ SOAJ injection Inject 0.3 mLs (0.3 mg total) into the muscle once. 1 Device 0  . glipiZIDE (GLUCOTROL) 10 MG tablet Take 1 tablet (10 mg total) by mouth 2 (two) times daily before a meal. 60 tablet 0  . insulin NPH-regular Human (NOVOLIN 70/30 RELION) (70-30) 100 UNIT/ML injection Inject 20 Units into the skin 2 (two) times daily with a meal. 30 mL 0  . Insulin Syringe-Needle U-100 (TRUEPLUS INSULIN SYRINGE) 30G X 5/16" 0.5 ML MISC 1 each by Does not apply route 2 (two) times daily. 100 each 3  . losartan (COZAAR) 50 MG tablet Take 1 tablet (50 mg total) by mouth daily. 90 tablet 0  . metFORMIN (GLUCOPHAGE) 1000 MG tablet Take 1 tablet (1,000 mg total) by mouth 2 (two) times daily with a meal. 60 tablet 3  . Alcohol Swabs (ALCOHOL PADS) 70 % PADS Check blood sugar tid & qhs (Patient not taking: Reported on 12/26/2014) 100 each 2  .  Naproxen Sod-Diphenhydramine (ALEVE PM) 220-25 MG TABS Take 2 tablets by mouth at bedtime as needed (pain/sleep).    . predniSONE (DELTASONE) 20 MG tablet 2 tabs po daily x 3 days (Patient not taking: Reported on 12/26/2014) 6 tablet 0  . spironolactone (ALDACTONE) 25 MG tablet Take 1 tablet (25 mg total) by mouth daily. (Patient not taking: Reported on 12/26/2014) 30 tablet 0  . traMADol (ULTRAM) 50 MG tablet Take 1 tablet (50 mg total) by mouth every 8 (eight) hours as needed. (Patient not taking: Reported on 12/26/2014) 30 tablet 0   No facility-administered medications prior to visit.    ROS Review of Systems  Constitutional: Negative for fever, chills, fatigue and unexpected weight change.  Eyes: Negative for visual disturbance.  Respiratory: Negative for cough and shortness of breath.   Cardiovascular: Negative for chest pain, palpitations and leg swelling.  Gastrointestinal: Negative for nausea, vomiting, abdominal pain, diarrhea, constipation and blood in stool.  Endocrine: Negative for polydipsia, polyphagia and polyuria.  Musculoskeletal: Positive for back pain. Negative for myalgias, arthralgias, gait problem and neck pain.  Skin: Negative for rash.  Allergic/Immunologic: Negative for immunocompromised state.  Hematological: Negative for adenopathy. Does not bruise/bleed easily.  Psychiatric/Behavioral: Negative for suicidal ideas, sleep disturbance and dysphoric mood. The patient  is not nervous/anxious.     Objective:  BP 127/76 mmHg  Pulse 71  Temp(Src) 98.8 F (37.1 C) (Oral)  Resp 16  Ht 6' (1.829 m)  Wt 309 lb (140.161 kg)  BMI 41.90 kg/m2  SpO2 95%  BP/Weight 12/26/2014 12/05/2014 08/22/2014  Systolic BP 127 148 148  Diastolic BP 76 71 88  Wt. (Lbs) 309 - 314.2  BMI 41.9 - 42.6   Physical Exam  Constitutional: He appears well-developed and well-nourished. No distress.  Obese   HENT:  Head: Normocephalic and atraumatic.  Neck: Normal range of motion. Neck supple.   Cardiovascular: Normal rate, regular rhythm, normal heart sounds and intact distal pulses.   Pulmonary/Chest: Effort normal and breath sounds normal.  Musculoskeletal: He exhibits no edema.  Neurological: He is alert.  Skin: Skin is warm and dry. No rash noted. No erythema.  Psychiatric: He has a normal mood and affect.   Lab Results  Component Value Date   HGBA1C 6.0 06/27/2014   Lab Results  Component Value Date   HGBA1C 6.30 12/26/2014   CBG 130   Assessment & Plan:   Problem List Items Addressed This Visit    Chronic low back pain (Chronic)   Relevant Medications   traMADol (ULTRAM) 50 MG tablet   DM (diabetes mellitus) - Primary (Chronic)   Relevant Medications   metFORMIN (GLUCOPHAGE) 1000 MG tablet   losartan (COZAAR) 50 MG tablet   glipiZIDE (GLUCOTROL) 10 MG tablet   insulin NPH-regular Human (NOVOLIN 70/30 RELION) (70-30) 100 UNIT/ML injection   Insulin Syringe-Needle U-100 (TRUEPLUS INSULIN SYRINGE) 30G X 5/16" 0.5 ML MISC   Other Relevant Orders   POCT glucose (manual entry) (Completed)   POCT glycosylated hemoglobin (Hb A1C) (Completed)   Microalbumin/Creatinine Ratio, Urine (Completed)   Ambulatory referral to Ophthalmology   Hypertension (Chronic)   Relevant Medications   losartan (COZAAR) 50 MG tablet   carvedilol (COREG) 12.5 MG tablet   Systolic CHF (Chronic)   Relevant Medications   losartan (COZAAR) 50 MG tablet   carvedilol (COREG) 12.5 MG tablet   Other Relevant Orders   Echocardiogram    Other Visit Diagnoses    Health care maintenance        Relevant Orders    Flu Vaccine QUAD 36+ mos PF IM (Fluarix & Fluzone Quad PF) (Completed)       No orders of the defined types were placed in this encounter.    Follow-up: No Follow-up on file.   Dessa Phi MD

## 2014-12-27 ENCOUNTER — Encounter: Payer: Self-pay | Admitting: Family Medicine

## 2014-12-27 ENCOUNTER — Telehealth: Payer: Self-pay | Admitting: *Deleted

## 2014-12-27 LAB — MICROALBUMIN / CREATININE URINE RATIO
Creatinine, Urine: 290.1 mg/dL
MICROALB/CREAT RATIO: 3.4 mg/g (ref 0.0–30.0)
Microalb, Ur: 1 mg/dL (ref <2.0)

## 2014-12-27 MED ORDER — INSULIN NPH ISOPHANE & REGULAR (70-30) 100 UNIT/ML ~~LOC~~ SUSP: 20.0000 [IU] | Freq: Two times a day (BID) | SUBCUTANEOUS | Status: DC

## 2014-12-27 NOTE — Telephone Encounter (Signed)
LVM to return call   Echo schedule for tomorrow 12/27/2014 at 1100 arriving 15 min early At [facility] lab

## 2014-12-27 NOTE — Addendum Note (Signed)
Addended by: Dessa Phi on: 12/27/2014 12:09 PM   Modules accepted: Orders

## 2014-12-28 ENCOUNTER — Ambulatory Visit (HOSPITAL_COMMUNITY): Payer: Self-pay | Attending: Family Medicine

## 2014-12-28 ENCOUNTER — Telehealth (HOSPITAL_COMMUNITY): Payer: Self-pay | Admitting: Family Medicine

## 2014-12-28 NOTE — Telephone Encounter (Signed)
LVM to return call.

## 2014-12-28 NOTE — Telephone Encounter (Signed)
-----   Message from Dessa Phi, MD sent at 12/27/2014  8:55 AM EDT ----- Normal urine microalbumin

## 2014-12-28 NOTE — Telephone Encounter (Signed)
[facility] [place] [facility] called to inform RMA that patient no showed to his appointment this morning. Please f/u if necessary

## 2015-01-23 ENCOUNTER — Encounter: Payer: Self-pay | Admitting: Pharmacist

## 2015-02-09 ENCOUNTER — Other Ambulatory Visit: Payer: Self-pay | Admitting: *Deleted

## 2015-02-09 DIAGNOSIS — I10 Essential (primary) hypertension: Secondary | ICD-10-CM

## 2015-02-09 DIAGNOSIS — M545 Low back pain, unspecified: Secondary | ICD-10-CM

## 2015-02-09 DIAGNOSIS — E089 Diabetes mellitus due to underlying condition without complications: Secondary | ICD-10-CM

## 2015-02-09 DIAGNOSIS — G8929 Other chronic pain: Secondary | ICD-10-CM

## 2015-02-09 MED ORDER — METFORMIN HCL 1000 MG PO TABS: 1000.0000 mg | ORAL_TABLET | Freq: Two times a day (BID) | ORAL | Status: DC

## 2015-02-09 MED ORDER — "INSULIN SYRINGE-NEEDLE U-100 30G X 5/16"" 0.5 ML MISC": 1.0000 | Freq: Two times a day (BID) | Status: DC

## 2015-02-09 MED ORDER — LOSARTAN POTASSIUM 50 MG PO TABS: 50.0000 mg | ORAL_TABLET | Freq: Every day | ORAL | Status: DC

## 2015-02-09 MED ORDER — TRAMADOL HCL 50 MG PO TABS: 50.0000 mg | ORAL_TABLET | Freq: Every day | ORAL | Status: DC

## 2015-02-09 MED ORDER — GLIPIZIDE 10 MG PO TABS: 10.0000 mg | ORAL_TABLET | Freq: Two times a day (BID) | ORAL | Status: DC

## 2015-02-09 MED ORDER — INSULIN NPH ISOPHANE & REGULAR (70-30) 100 UNIT/ML ~~LOC~~ SUSP: 20.0000 [IU] | Freq: Two times a day (BID) | SUBCUTANEOUS | Status: DC

## 2015-02-09 NOTE — Telephone Encounter (Signed)
Patients medications have been refilled. All medications except Tramadol have 3 additional refills.

## 2015-02-12 ENCOUNTER — Telehealth: Payer: Self-pay | Admitting: *Deleted

## 2015-02-12 NOTE — Telephone Encounter (Signed)
LVM Rx at front office ready to be pickup   (Rx Tramadol)

## 2015-04-19 ENCOUNTER — Other Ambulatory Visit: Payer: Self-pay | Admitting: Internal Medicine

## 2015-04-19 MED FILL — LOSARTAN POTASSIUM 50 MG TA: 50 | 30 days supply | Qty: 30 | Fill #0

## 2015-04-19 MED FILL — ?GLIPIZIDE 10 MG TABLET: 10 | 30 days supply | Qty: 60 | Fill #3

## 2015-04-19 MED FILL — ?METFORMIN HCL 1,000 MG TAB: 1000 | 30 days supply | Qty: 60 | Fill #0

## 2015-04-19 MED FILL — ?CARVEDILOL 12.5 MG TABLET: 12.5 | 30 days supply | Qty: 60 | Fill #3

## 2015-04-24 ENCOUNTER — Other Ambulatory Visit: Payer: Self-pay | Admitting: Internal Medicine

## 2015-04-24 NOTE — Telephone Encounter (Signed)
Patient wanting a refill on his aldactone.  It seems to have been dc'd my Dr. Armen Pickup at his visit in September but there is no note as to why.  Patient is asking for refill.  Please follow up

## 2015-04-24 NOTE — Telephone Encounter (Signed)
It was D/Cd because he reported not taking it (see note/med rec). His BP was well controlled without it and there was no fluid overload.  If he feels like he needs it again due to swelling he needs OV to assess.  Please have patient schedule OV

## 2015-04-25 ENCOUNTER — Encounter: Payer: Self-pay | Admitting: Family Medicine

## 2015-04-25 ENCOUNTER — Ambulatory Visit: Payer: Self-pay | Attending: Family Medicine | Admitting: Family Medicine

## 2015-04-25 VITALS — BP 171/53 | HR 97 | Temp 98.0°F | Resp 15 | Ht 72.0 in | Wt 321.8 lb

## 2015-04-25 DIAGNOSIS — E119 Type 2 diabetes mellitus without complications: Secondary | ICD-10-CM

## 2015-04-25 DIAGNOSIS — I10 Essential (primary) hypertension: Secondary | ICD-10-CM | POA: Insufficient documentation

## 2015-04-25 DIAGNOSIS — E1165 Type 2 diabetes mellitus with hyperglycemia: Secondary | ICD-10-CM | POA: Insufficient documentation

## 2015-04-25 DIAGNOSIS — E089 Diabetes mellitus due to underlying condition without complications: Secondary | ICD-10-CM

## 2015-04-25 DIAGNOSIS — I5022 Chronic systolic (congestive) heart failure: Secondary | ICD-10-CM | POA: Insufficient documentation

## 2015-04-25 DIAGNOSIS — Z7982 Long term (current) use of aspirin: Secondary | ICD-10-CM | POA: Insufficient documentation

## 2015-04-25 DIAGNOSIS — Z760 Encounter for issue of repeat prescription: Secondary | ICD-10-CM | POA: Insufficient documentation

## 2015-04-25 DIAGNOSIS — Z79899 Other long term (current) drug therapy: Secondary | ICD-10-CM | POA: Insufficient documentation

## 2015-04-25 DIAGNOSIS — Z794 Long term (current) use of insulin: Secondary | ICD-10-CM | POA: Insufficient documentation

## 2015-04-25 DIAGNOSIS — E669 Obesity, unspecified: Secondary | ICD-10-CM | POA: Insufficient documentation

## 2015-04-25 DIAGNOSIS — Z7984 Long term (current) use of oral hypoglycemic drugs: Secondary | ICD-10-CM | POA: Insufficient documentation

## 2015-04-25 DIAGNOSIS — I509 Heart failure, unspecified: Secondary | ICD-10-CM | POA: Insufficient documentation

## 2015-04-25 DIAGNOSIS — Z6841 Body Mass Index (BMI) 40.0 and over, adult: Secondary | ICD-10-CM | POA: Insufficient documentation

## 2015-04-25 LAB — POCT GLYCOSYLATED HEMOGLOBIN (HGB A1C): HEMOGLOBIN A1C: 6.9

## 2015-04-25 LAB — GLUCOSE, POCT (MANUAL RESULT ENTRY): POC GLUCOSE: 127 mg/dL — AB (ref 70–99)

## 2015-04-25 MED ORDER — INSULIN NPH ISOPHANE & REGULAR (70-30) 100 UNIT/ML ~~LOC~~ SUSP: 25.0000 [IU] | Freq: Two times a day (BID) | SUBCUTANEOUS | Status: DC

## 2015-04-25 MED ORDER — SPIRONOLACTONE 25 MG PO TABS: 25.0000 mg | ORAL_TABLET | Freq: Every day | ORAL | Status: DC

## 2015-04-25 MED FILL — ?SPIRONOLACTONE 25 MG TABLE: 25 MG | 30 days supply | Qty: 30 | Fill #0

## 2015-04-25 MED FILL — NOVOLIN 70/30 100 UNITS/ML: (70-30) 100 | 40 days supply | Qty: 20 | Fill #0

## 2015-04-25 NOTE — Progress Notes (Signed)
Patient is upset because he states he has been on spirinaldactone for a long time He saw Dr. Armen Pickup in 9/27 and tried to pick up his refill and it appears she has discontinued it. There is no note in her visit with him that addresses this He is asking for 90 day supply on meds He states that many days a week he is trying to leave the house and has to "run to the bathroom to have a bm" he does not know if it is medication related

## 2015-04-25 NOTE — Patient Instructions (Signed)
Hypertension Hypertension, commonly called high blood pressure, is when the force of blood pumping through your arteries is too strong. Your arteries are the blood vessels that carry blood from your heart throughout your body. A blood pressure reading consists of a higher number over a lower number, such as 110/72. The higher number (systolic) is the pressure inside your arteries when your heart pumps. The lower number (diastolic) is the pressure inside your arteries when your heart relaxes. Ideally you want your blood pressure below 120/80. Hypertension forces your heart to work harder to pump blood. Your arteries may become narrow or stiff. Having untreated or uncontrolled hypertension can cause heart attack, stroke, kidney disease, and other problems. RISK FACTORS Some risk factors for high blood pressure are controllable. Others are not.  Risk factors you cannot control include:   Race. You may be at higher risk if you are African American.  Age. Risk increases with age.  Gender. Men are at higher risk than women before age 45 years. After age 65, women are at higher risk than men. Risk factors you can control include:  Not getting enough exercise or physical activity.  Being overweight.  Getting too much fat, sugar, calories, or salt in your diet.  Drinking too much alcohol. SIGNS AND SYMPTOMS Hypertension does not usually cause signs or symptoms. Extremely high blood pressure (hypertensive crisis) may cause headache, anxiety, shortness of breath, and nosebleed. DIAGNOSIS To check if you have hypertension, your health care provider will measure your blood pressure while you are seated, with your arm held at the level of your heart. It should be measured at least twice using the same arm. Certain conditions can cause a difference in blood pressure between your right and left arms. A blood pressure reading that is higher than normal on one occasion does not mean that you need treatment. If  it is not clear whether you have high blood pressure, you may be asked to return on a different day to have your blood pressure checked again. Or, you may be asked to monitor your blood pressure at home for 1 or more weeks. TREATMENT Treating high blood pressure includes making lifestyle changes and possibly taking medicine. Living a healthy lifestyle can help lower high blood pressure. You may need to change some of your habits. Lifestyle changes may include:  Following the DASH diet. This diet is high in fruits, vegetables, and whole grains. It is low in salt, red meat, and added sugars.  Keep your sodium intake below 2,300 mg per day.  Getting at least 30-45 minutes of aerobic exercise at least 4 times per week.  Losing weight if necessary.  Not smoking.  Limiting alcoholic beverages.  Learning ways to reduce stress. Your health care provider may prescribe medicine if lifestyle changes are not enough to get your blood pressure under control, and if one of the following is true:  You are 18-59 years of age and your systolic blood pressure is above 140.  You are 60 years of age or older, and your systolic blood pressure is above 150.  Your diastolic blood pressure is above 90.  You have diabetes, and your systolic blood pressure is over 140 or your diastolic blood pressure is over 90.  You have kidney disease and your blood pressure is above 140/90.  You have heart disease and your blood pressure is above 140/90. Your personal target blood pressure may vary depending on your medical conditions, your age, and other factors. HOME CARE INSTRUCTIONS    Have your blood pressure rechecked as directed by your health care provider.   Take medicines only as directed by your health care provider. Follow the directions carefully. Blood pressure medicines must be taken as prescribed. The medicine does not work as well when you skip doses. Skipping doses also puts you at risk for  problems.  Do not smoke.   Monitor your blood pressure at home as directed by your health care provider. SEEK MEDICAL CARE IF:   You think you are having a reaction to medicines taken.  You have recurrent headaches or feel dizzy.  You have swelling in your ankles.  You have trouble with your vision. SEEK IMMEDIATE MEDICAL CARE IF:  You develop a severe headache or confusion.  You have unusual weakness, numbness, or feel faint.  You have severe chest or abdominal pain.  You vomit repeatedly.  You have trouble breathing. MAKE SURE YOU:   Understand these instructions.  Will watch your condition.  Will get help right away if you are not doing well or get worse.   This information is not intended to replace advice given to you by your health care provider. Make sure you discuss any questions you have with your health care provider.   Document Released: 03/17/2005 Document Revised: 08/01/2014 Document Reviewed: 01/07/2013 Elsevier Interactive Patient Education 2016 [employer]  

## 2015-04-25 NOTE — Progress Notes (Signed)
Subjective:  Patient ID: Dave Arnold, male    DOB: 03-Nov-1970  Age: 45 y.o. MRN: [id number]  CC: Medication Refill   HPI Dave Arnold is a 45 year old male with a history of type 2 diabetes mellitus (A1c 6.9), hypertension, CHF (EF 50-55%), obesity who comes into the clinic for a refill of his spironolactone.  He is upset because he has been unable to obtain spironolactone which does not appear on his med list but he states he was on it up until now. Review of his chart indicates that at his last visit in 11/2014 he did not receive this prescription however with his previous PCP in 05/2014 he was supposed to be on it. And his blood pressure is elevated.  He has been compliant with his Novolin 70/30 and metformin for diabetes but complains of having to move his bowels frequently which is upsetting to him.  For his CHF he does not see a cardiologist and denies shortness of breath or chest pains. He does not get any much exercise and has not been eating healthy 8 the but is ready to work on it.   Past Medical History  Diagnosis Date  . Hypertension   . Obesity   . Heart failure of unknown type (HCC) 06/2011  . Thrombocytopenia (HCC)   . Diabetes mellitus without complication (HCC)     History reviewed. No pertinent past surgical history.'  Outpatient Prescriptions Prior to Visit  Medication Sig Dispense Refill  . aspirin EC 81 MG tablet Take 162 mg by mouth daily.    . carvedilol (COREG) 12.5 MG tablet Take 1 tablet (12.5 mg total) by mouth 2 (two) times daily with a meal. 180 tablet 1  . glipiZIDE (GLUCOTROL) 10 MG tablet Take 1 tablet (10 mg total) by mouth 2 (two) times daily before a meal. 180 tablet 3  . Insulin Syringe-Needle U-100 (TRUEPLUS INSULIN SYRINGE) 30G X 5/16" 0.5 ML MISC 1 each by Does not apply route 2 (two) times daily. 100 each 3  . losartan (COZAAR) 50 MG tablet Take 1 tablet (50 mg total) by mouth daily. 90 tablet 3  . insulin NPH-regular Human  (NOVOLIN 70/30 RELION) (70-30) 100 UNIT/ML injection Inject 20 Units into the skin 2 (two) times daily with a meal. 90 mL 3  . metFORMIN (GLUCOPHAGE) 1000 MG tablet Take 1 tablet (1,000 mg total) by mouth 2 (two) times daily with a meal. 180 tablet 3  . EPINEPHrine (EPIPEN 2-PAK) 0.3 mg/0.3 mL IJ SOAJ injection Inject 0.3 mLs (0.3 mg total) into the muscle once. (Patient not taking: Reported on 04/25/2015) 1 Device 0  . traMADol (ULTRAM) 50 MG tablet Take 1 tablet (50 mg total) by mouth at bedtime. (Patient not taking: Reported on 04/25/2015) 30 tablet 0   No facility-administered medications prior to visit.    ROS Review of Systems  Constitutional: Negative for activity change and appetite change.  HENT: Negative for sinus pressure and sore throat.   Respiratory: Negative for chest tightness, shortness of breath and wheezing.   Cardiovascular: Negative for chest pain and palpitations.  Gastrointestinal:       See history of present illness  Genitourinary: Negative.   Musculoskeletal: Negative.   Psychiatric/Behavioral: Negative for behavioral problems and dysphoric mood.    Objective:  BP 171/53 mmHg  Pulse 97  Temp(Src) 98 F (36.7 C)  Resp 15  Ht 6' (1.829 m)  Wt 321 lb 12.8 oz (145.968 kg)  BMI 43.63 kg/m2  SpO2 96%  BP/Weight 04/25/2015 12/26/2014 12/05/2014  Systolic BP 171 127 148  Diastolic BP 53 76 71  Wt. (Lbs) 321.8 309 -  BMI 43.63 41.9 -    BMET    Component Value Date/Time   NA 140 12/05/2014 1520   K 3.7 12/05/2014 1520   CL 105 12/05/2014 1520   CO2 25 12/05/2014 1520   GLUCOSE 110* 12/05/2014 1520   BUN 16 12/05/2014 1520   CREATININE 0.88 12/05/2014 1520   CREATININE 0.79 06/27/2014 1226   CALCIUM 9.0 12/05/2014 1520   GFRNONAA >60 12/05/2014 1520   GFRNONAA >89 06/27/2014 1226   GFRAA >60 12/05/2014 1520   GFRAA >89 06/27/2014 1226      Physical Exam  Constitutional: He is oriented to person, place, and time. He appears well-developed and  well-nourished.  Obese  Cardiovascular: Normal rate, normal heart sounds and intact distal pulses.   No murmur heard. Pulmonary/Chest: Effort normal and breath sounds normal. He has no wheezes. He has no rales. He exhibits no tenderness.  Abdominal: Soft. Bowel sounds are normal. He exhibits no distension and no mass. There is no tenderness.  Musculoskeletal: Normal range of motion.  Neurological: He is alert and oriented to person, place, and time.     Assessment & Plan:   1. Type 2 diabetes mellitus without complication, unspecified long term insulin use status (HCC) Trolled with A1c of 6.9 Discontinue metformin due to GI complaints. Increased Novolin 70/30/30-25 units twice daily. We'll review blood sugar log at next visit and perform diabetic healthcare maintenance - HgB A1c - Glucose (CBG)  2. Diabetes mellitus due to underlying condition without complication, unspecified long term insulin use status (HCC) - insulin NPH-regular Human (NOVOLIN 70/30 RELION) (70-30) 100 UNIT/ML injection; Inject 25 Units into the skin 2 (two) times daily with a meal.  Dispense: 90 mL; Refill: 3 - COMPLETE METABOLIC PANEL WITH GFR; Future - Lipid panel; Future - Microalbumin/Creatinine Ratio, Urine; Future  3. Chronic systolic congestive heart failure (HCC)euvolemic Euvolemic. EF of 50-55%, diffuse hypokinesis from 2-D echo of 06/2013 He will need a repeat 2-D echo at his next visit  4. Essential hypertension Uncontrolled. We'll reassess blood pressure at his next visit after he has to be filled his spironolactone Low-sodium diet   Meds ordered this encounter  Medications  . spironolactone (ALDACTONE) 25 MG tablet    Sig: Take 1 tablet (25 mg total) by mouth daily.    Dispense:  90 tablet    Refill:  1  . insulin NPH-regular Human (NOVOLIN 70/30 RELION) (70-30) 100 UNIT/ML injection    Sig: Inject 25 Units into the skin 2 (two) times daily with a meal.    Dispense:  90 mL    Refill:  3     Discontinue 20 units    Follow-up: Return in about 1 month (around 05/26/2015) for follow up of Hypertension.   This note has been created with Education officer, environmental. Any transcriptional errors are unintentional.     Jaclyn Shaggy MD

## 2015-04-26 ENCOUNTER — Ambulatory Visit: Payer: Self-pay | Attending: Family Medicine

## 2015-04-26 DIAGNOSIS — E089 Diabetes mellitus due to underlying condition without complications: Secondary | ICD-10-CM

## 2015-04-26 LAB — COMPLETE METABOLIC PANEL WITH GFR
ALBUMIN: 4 g/dL (ref 3.6–5.1)
ALK PHOS: 70 U/L (ref 40–115)
ALT: 38 U/L (ref 9–46)
AST: 33 U/L (ref 10–40)
BUN: 12 mg/dL (ref 7–25)
CALCIUM: 9.1 mg/dL (ref 8.6–10.3)
CO2: 29 mmol/L (ref 20–31)
Chloride: 106 mmol/L (ref 98–110)
Creat: 1.08 mg/dL (ref 0.60–1.35)
GFR, EST NON AFRICAN AMERICAN: 84 mL/min (ref >=60)
Glucose, Bld: 151 mg/dL — ABNORMAL HIGH (ref 65–99)
POTASSIUM: 3.9 mmol/L (ref 3.5–5.3)
Sodium: 145 mmol/L (ref 135–146)
Total Bilirubin: 0.5 mg/dL (ref 0.2–1.2)
Total Protein: 6.7 g/dL (ref 6.1–8.1)

## 2015-04-26 LAB — LIPID PANEL
CHOL/HDL RATIO: 6.9 ratio — AB (ref <=5.0)
CHOLESTEROL: 151 mg/dL (ref 125–200)
HDL: 22 mg/dL — AB (ref >=40)
LDL Cholesterol: 83 mg/dL (ref <130)
TRIGLYCERIDES: 230 mg/dL — AB (ref <150)
VLDL: 46 mg/dL — ABNORMAL HIGH (ref <30)

## 2015-04-27 ENCOUNTER — Other Ambulatory Visit: Payer: Self-pay | Admitting: Family Medicine

## 2015-04-27 LAB — MICROALBUMIN / CREATININE URINE RATIO
Creatinine, Urine: 483 mg/dL — ABNORMAL HIGH (ref 20–370)
MICROALB UR: 12.6 mg/dL
Microalb Creat Ratio: 26 mcg/mg creat (ref <30)

## 2015-04-27 MED ORDER — ATORVASTATIN CALCIUM 20 MG PO TABS: 20.0000 mg | ORAL_TABLET | Freq: Every day | ORAL | Status: DC

## 2015-04-30 ENCOUNTER — Other Ambulatory Visit: Payer: Self-pay | Admitting: *Deleted

## 2015-04-30 DIAGNOSIS — E089 Diabetes mellitus due to underlying condition without complications: Secondary | ICD-10-CM

## 2015-04-30 MED ORDER — INSULIN NPH ISOPHANE & REGULAR (70-30) 100 UNIT/ML ~~LOC~~ SUSP: 25.0000 [IU] | Freq: Two times a day (BID) | SUBCUTANEOUS | Status: DC

## 2015-05-02 ENCOUNTER — Telehealth: Payer: Self-pay

## 2015-05-02 NOTE — Telephone Encounter (Signed)
Called patient this pm and informed that is he feels he needs the  Aldactone he would need to make an appointment to be assessed Patient stated he would call tomorrow to make the appointment

## 2015-05-07 ENCOUNTER — Telehealth: Payer: Self-pay | Admitting: *Deleted

## 2015-05-07 NOTE — Telephone Encounter (Signed)
-----   Message from Jaclyn Shaggy, MD sent at 04/27/2015  9:26 AM EST ----- His cholesterol is normal but triglycerides are mildly elevated and so I have placed him on a low dose atorvastatin. Other labs are stable.

## 2015-05-07 NOTE — Telephone Encounter (Signed)
Left HIPAA compliant message to return my call . 

## 2015-05-10 NOTE — Telephone Encounter (Signed)
-----   Message from Jaclyn Shaggy, MD sent at 04/27/2015  9:26 AM EST ----- His cholesterol is normal but triglycerides are mildly elevated and so I have placed him on a low dose atorvastatin. Other labs are stable.

## 2015-05-10 NOTE — Telephone Encounter (Signed)
Interpreter left HIPAA compliant voicemail that RN trying to reach him.  Gave return phone number and asked patient to return call.

## 2015-05-11 NOTE — Telephone Encounter (Signed)
Verified name and date of birth and gave results with patient verifying understanding.

## 2015-05-18 MED FILL — ATORVASTATIN 20 MG TABLET: 20 | 30 days supply | Qty: 30 | Fill #0

## 2015-05-21 IMAGING — CR DG CHEST 2V
2 series · 2 of 2 positions shown · non-contrast
Comparison: Prior radiograph from 10/23/2012

CLINICAL DATA: Chest pain

EXAM:
CHEST  2 VIEW

[w chest pa]
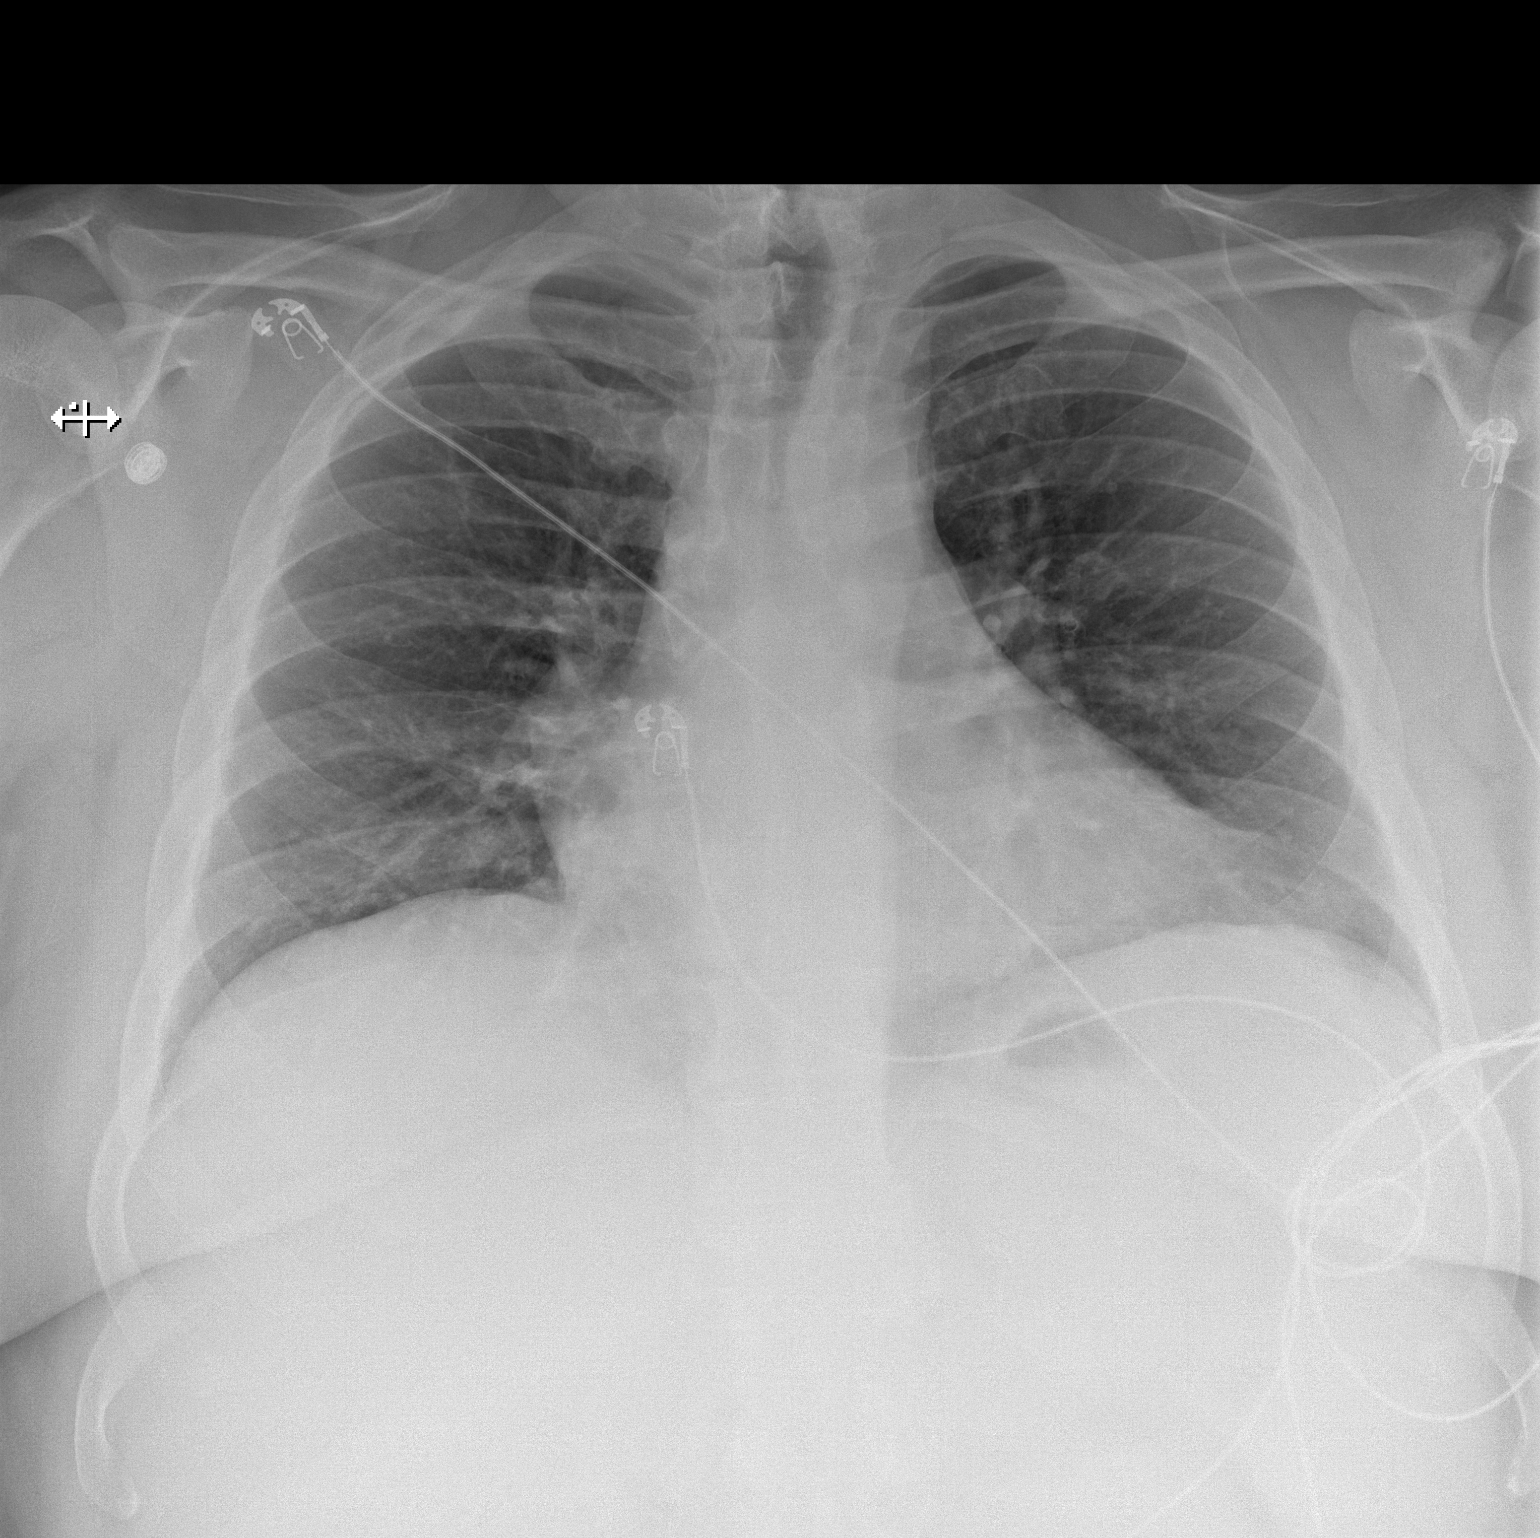

[w chest lat]
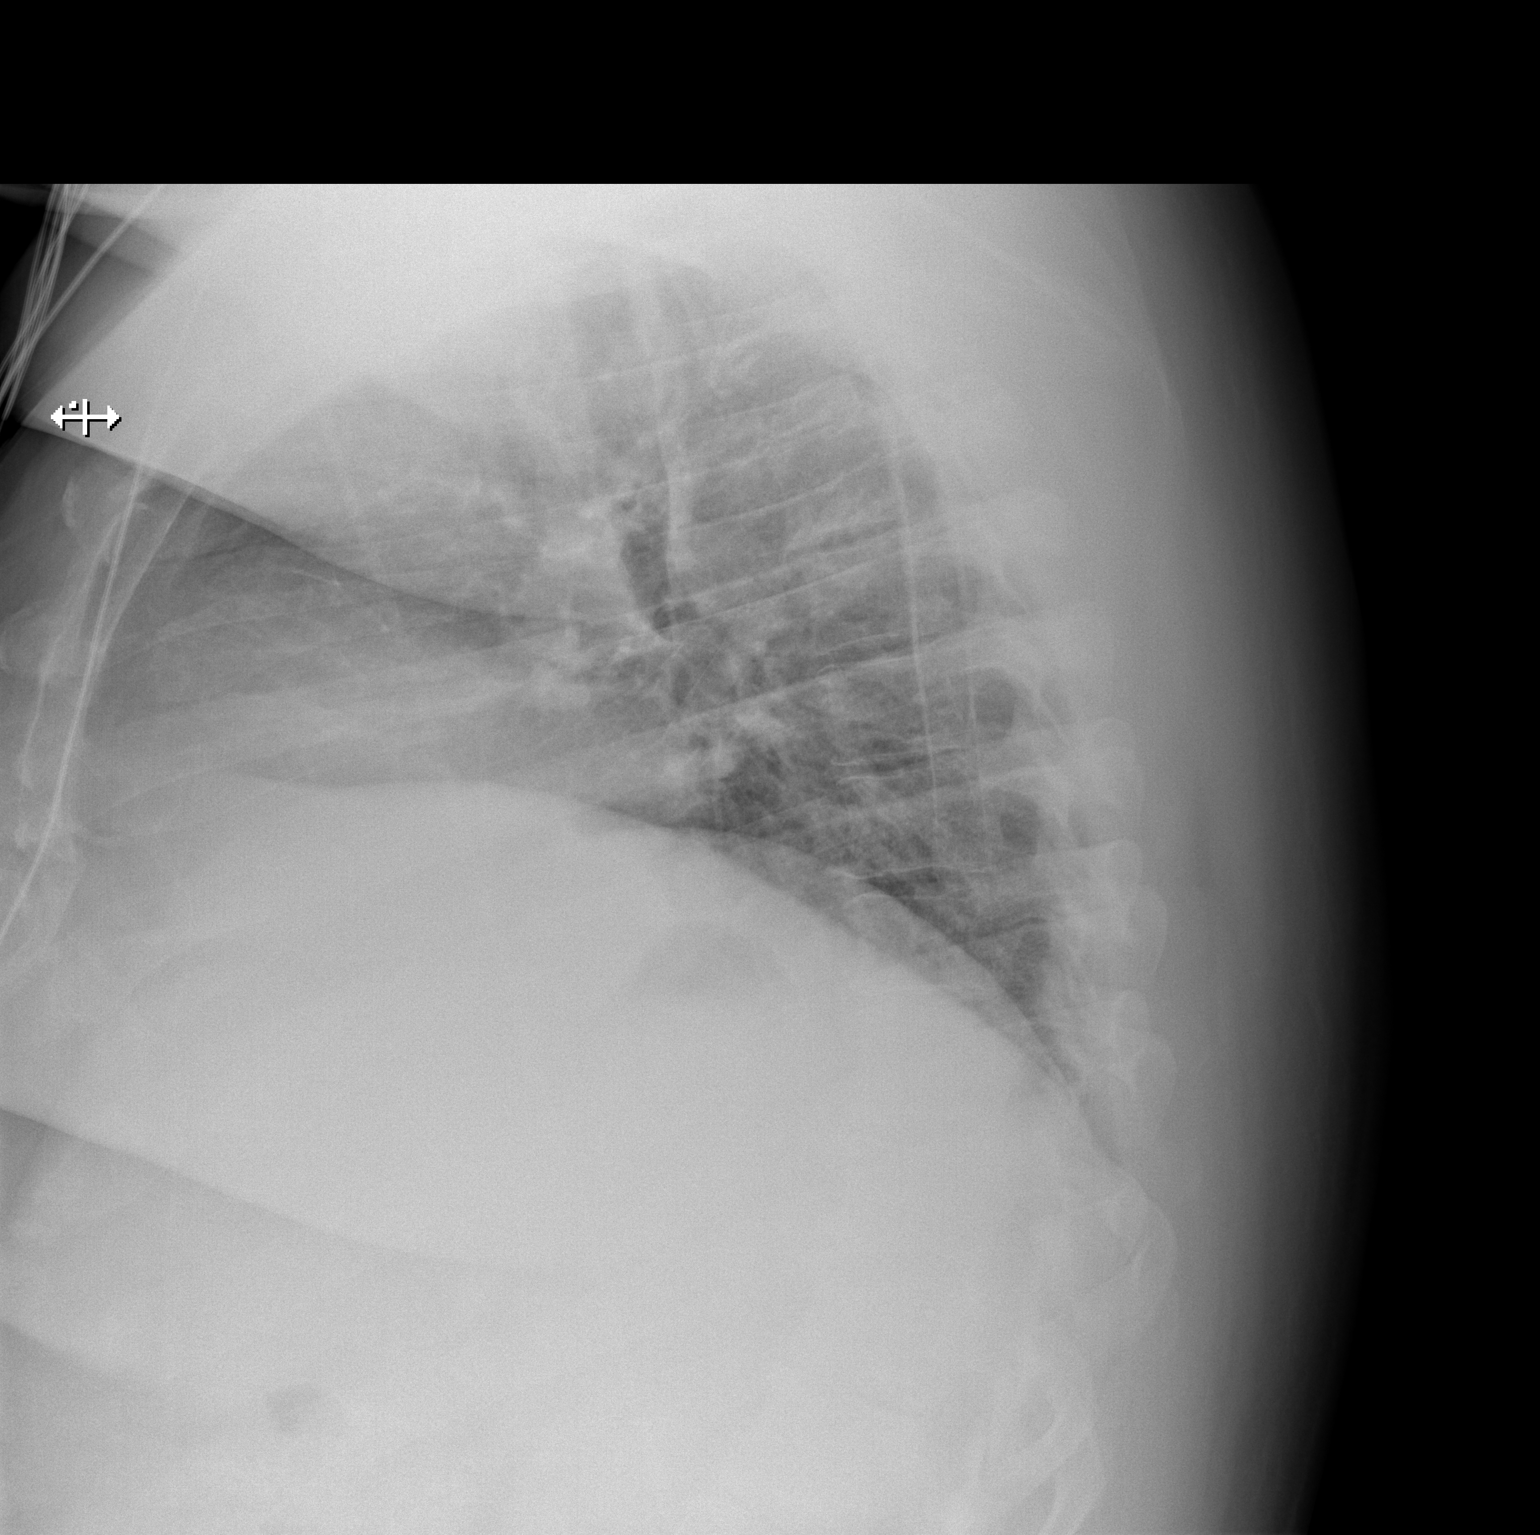

[2 of 2 positions shown; findings below may reference images not displayed]

FINDINGS: Cardiomegaly is stable as compared to prior study. Mediastinal
silhouette within normal limits.

The lungs are normally inflated. No airspace consolidation, pleural
effusion, or pulmonary edema is identified. There is no
pneumothorax.

No acute osseous abnormality identified.
IMPRESSION: Stable cardiomegaly with no acute cardiopulmonary abnormality
identified.

## 2015-05-23 MED FILL — ?SPIRONOLACTONE 25 MG TABLE: 25 MG | 30 days supply | Qty: 30 | Fill #1

## 2015-05-23 MED FILL — glipiZIDE 10 MG TABS: 10 | 30 days supply | Qty: 60 | Fill #4

## 2015-05-23 MED FILL — CARVEDILOL 12.5 MG TABLET: 12.5 | 30 days supply | Qty: 60 | Fill #4

## 2015-05-23 MED FILL — LOSARTAN POTASSIUM 50 MG TA: 50 | 30 days supply | Qty: 30 | Fill #1

## 2015-05-25 ENCOUNTER — Encounter: Payer: Self-pay | Admitting: Family Medicine

## 2015-05-25 ENCOUNTER — Ambulatory Visit: Payer: Self-pay | Attending: Family Medicine | Admitting: Family Medicine

## 2015-05-25 VITALS — BP 153/95 | HR 61 | Temp 98.1°F | Resp 15 | Ht 72.0 in | Wt 318.0 lb

## 2015-05-25 DIAGNOSIS — I10 Essential (primary) hypertension: Secondary | ICD-10-CM

## 2015-05-25 DIAGNOSIS — E119 Type 2 diabetes mellitus without complications: Secondary | ICD-10-CM

## 2015-05-25 DIAGNOSIS — E089 Diabetes mellitus due to underlying condition without complications: Secondary | ICD-10-CM

## 2015-05-25 DIAGNOSIS — E785 Hyperlipidemia, unspecified: Secondary | ICD-10-CM

## 2015-05-25 DIAGNOSIS — Z794 Long term (current) use of insulin: Secondary | ICD-10-CM

## 2015-05-25 DIAGNOSIS — E669 Obesity, unspecified: Secondary | ICD-10-CM

## 2015-05-25 LAB — GLUCOSE, POCT (MANUAL RESULT ENTRY): POC GLUCOSE: 161 mg/dL — AB (ref 70–99)

## 2015-05-25 MED ORDER — LIRAGLUTIDE 18 MG/3ML ~~LOC~~ SOPN: PEN_INJECTOR | SUBCUTANEOUS | Status: DC

## 2015-05-25 MED ORDER — INSULIN NPH ISOPHANE & REGULAR (70-30) 100 UNIT/ML ~~LOC~~ SUSP: 25.0000 [IU] | Freq: Every day | SUBCUTANEOUS | Status: DC

## 2015-05-25 MED ORDER — CARVEDILOL 12.5 MG PO TABS: 12.5000 mg | ORAL_TABLET | Freq: Two times a day (BID) | ORAL | Status: DC

## 2015-05-25 MED ORDER — ATORVASTATIN CALCIUM 20 MG PO TABS: 20.0000 mg | ORAL_TABLET | Freq: Every day | ORAL | Status: DC

## 2015-05-25 MED ORDER — INSULIN SYRINGE-NEEDLE U-100 31G X 15/64" 0.5 ML MISC
1.0000 | Freq: Two times a day (BID) | Status: DC
Start: 2015-05-25 — End: 2017-03-16

## 2015-05-25 MED ORDER — LOSARTAN POTASSIUM 50 MG PO TABS: 50.0000 mg | ORAL_TABLET | Freq: Every day | ORAL | Status: DC

## 2015-05-25 MED FILL — !VICTOZA 18MG/3ML INJECT: 18 | 30 days supply | Qty: 6 | Fill #0

## 2015-05-25 NOTE — Progress Notes (Signed)
Patient has not picked up his cholesterol medication yet He also is out of one of his pressure medications but will pick it up today He thinks the insulin is causing his stomach to be bloated He needs refill on Tramadol for back pain

## 2015-05-25 NOTE — Progress Notes (Signed)
Subjective:  Patient ID: Dave Arnold, male    DOB: 1970/05/28  Age: 45 y.o. MRN: [id number]  CC: Follow-up   HPI Dave Arnold  is a 45 year old male with a history of type 2 diabetes mellitus (A1c 6.9), hypertension, hyperlipidemia, CHF (EF 50-55%), obesity who comes into the clinic for a follow-up visit. He was taken off metformin at his last visit due to diarrhea and he reports improvement in symptoms however he complains of abdominal bloating and weight gain with insulin. His blood pressure is elevated and he admits to running out of one of his antihypertensive.  Since his last visit he had blood work which revealed normal LDL and normal total cholesterol but elevated triglycerides. He continues to exercise and is trying to comply with an ADA diet and low-sodium diet but is frustrated because he is not able to lose weight especially in his abdomen and would like to come off the insulin if he can.  Outpatient Prescriptions Prior to Visit  Medication Sig Dispense Refill  . aspirin EC 81 MG tablet Take 162 mg by mouth daily.    Marland Kitchen glipiZIDE (GLUCOTROL) 10 MG tablet Take 1 tablet (10 mg total) by mouth 2 (two) times daily before a meal. 180 tablet 3  . spironolactone (ALDACTONE) 25 MG tablet Take 1 tablet (25 mg total) by mouth daily. 90 tablet 1  . carvedilol (COREG) 12.5 MG tablet Take 1 tablet (12.5 mg total) by mouth 2 (two) times daily with a meal. 180 tablet 1  . insulin NPH-regular Human (NOVOLIN 70/30 RELION) (70-30) 100 UNIT/ML injection Inject 25 Units into the skin 2 (two) times daily with a meal. 90 mL 3  . Insulin Syringe-Needle U-100 (TRUEPLUS INSULIN SYRINGE) 30G X 5/16" 0.5 ML MISC 1 each by Does not apply route 2 (two) times daily. 100 each 3  . EPINEPHrine (EPIPEN 2-PAK) 0.3 mg/0.3 mL IJ SOAJ injection Inject 0.3 mLs (0.3 mg total) into the muscle once. (Patient not taking: Reported on 04/25/2015) 1 Device 0  . traMADol (ULTRAM) 50 MG tablet Take 1 tablet (50 mg  total) by mouth at bedtime. (Patient not taking: Reported on 04/25/2015) 30 tablet 0  . atorvastatin (LIPITOR) 20 MG tablet Take 1 tablet (20 mg total) by mouth daily. (Patient not taking: Reported on 05/25/2015) 30 tablet 3  . insulin NPH-regular Human (NOVOLIN 70/30 RELION) (70-30) 100 UNIT/ML injection Inject 25 Units into the skin 2 (two) times daily with a meal. 90 mL 3  . losartan (COZAAR) 50 MG tablet Take 1 tablet (50 mg total) by mouth daily. (Patient not taking: Reported on 05/25/2015) 90 tablet 3   No facility-administered medications prior to visit.    ROS Review of Systems  Constitutional: Negative for activity change and appetite change.  HENT: Negative for sinus pressure and sore throat.   Eyes: Negative for visual disturbance.  Respiratory: Negative for cough, chest tightness and shortness of breath.   Cardiovascular: Negative for chest pain and leg swelling.  Gastrointestinal: Negative for abdominal pain, diarrhea, constipation and abdominal distention.  Endocrine: Negative.   Genitourinary: Negative for dysuria.  Musculoskeletal: Negative for myalgias and joint swelling.  Skin: Negative for rash.  Allergic/Immunologic: Negative.   Neurological: Negative for weakness, light-headedness and numbness.  Psychiatric/Behavioral: Negative for suicidal ideas and dysphoric mood.    Objective:  BP 153/95 mmHg  Pulse 61  Temp(Src) 98.1 F (36.7 C)  Resp 15  Ht 6' (1.829 m)  Wt 318 lb (144.244 kg)  BMI  43.12 kg/m2  SpO2 95%  BP/Weight 05/25/2015 04/25/2015 12/26/2014  Systolic BP 153 171 127  Diastolic BP 95 53 76  Wt. (Lbs) 318 321.8 309  BMI 43.12 43.63 41.9      Physical Exam  Constitutional: He is oriented to person, place, and time. He appears well-developed and well-nourished.  Mobility obese  Cardiovascular: Normal rate, normal heart sounds and intact distal pulses.   No murmur heard. Pulmonary/Chest: Effort normal and breath sounds normal. He has no wheezes.  He has no rales. He exhibits no tenderness.  Abdominal: Soft. Bowel sounds are normal. He exhibits no distension and no mass. There is no tenderness.  Musculoskeletal: Normal range of motion.  Neurological: He is alert and oriented to person, place, and time.  Skin: Skin is warm and dry.  Psychiatric: He has a normal mood and affect.     Lipid Panel     Component Value Date/Time   CHOL 151 04/26/2015 1054   TRIG 230* 04/26/2015 1054   HDL 22* 04/26/2015 1054   CHOLHDL 6.9* 04/26/2015 1054   VLDL 46* 04/26/2015 1054   LDLCALC 83 04/26/2015 1054    p Assessment & Plan:   1. Type 2 diabetes mellitus without complication, with long-term current use of insulin (HCC) A1c of 6.9 from 04/2015 He would like to come off Insulin due to weight gain so i will place on Victoza Advised to take NovoLog 70/30 once daily while he titrates his Victoza We'll review blood sugar log at next visit-discussed symptoms of hypoglycemia and he knows to reduce the dose of his NovoLog 70/30 in the event that that occurs. - Glucose (CBG) - Liraglutide 18 MG/3ML SOPN; Inject 0.6 mg for1, week then 1.2 mg for1 week, then 1.8 mg daily  Dispense: 6 mL; Refill: 3 - Insulin Syringe-Needle U-100 (BD INSULIN SYRINGE ULTRAFINE) 31G X 15/64" 0.5 ML MISC; 1 each by Does not apply route 2 (two) times daily.  Dispense: 100 each; Refill: 5  2. Diabetes mellitus due to underlying condition without complication, unspecified long term insulin use status (HCC) - insulin NPH-regular Human (NOVOLIN 70/30 RELION) (70-30) 100 UNIT/ML injection; Inject 25 Units into the skin daily with supper.  Dispense: 90 mL; Refill: 3  3. Essential hypertension Uncontrolled due to running out of medications. Advised on low-sodium diet - carvedilol (COREG) 12.5 MG tablet; Take 1 tablet (12.5 mg total) by mouth 2 (two) times daily with a meal.  Dispense: 180 tablet; Refill: 1 - losartan (COZAAR) 50 MG tablet; Take 1 tablet (50 mg total) by mouth  daily.  Dispense: 90 tablet; Refill: 3  4. Hyperlipidemia Controlled with LDL of 83 but elevated triglycerides at 2:30 - atorvastatin (LIPITOR) 20 MG tablet; Take 1 tablet (20 mg total) by mouth daily.  Dispense: 90 tablet; Refill: 1  5. Obesity Advised to increase physical activity and reduce portion sizes    Meds ordered this encounter  Medications  . Liraglutide 18 MG/3ML SOPN    Sig: Inject 0.6 mg for1, week then 1.2 mg for1 week, then 1.8 mg daily    Dispense:  6 mL    Refill:  3  . insulin NPH-regular Human (NOVOLIN 70/30 RELION) (70-30) 100 UNIT/ML injection    Sig: Inject 25 Units into the skin daily with supper.    Dispense:  90 mL    Refill:  3    Discontinue 20 units  . carvedilol (COREG) 12.5 MG tablet    Sig: Take 1 tablet (12.5 mg total) by  mouth 2 (two) times daily with a meal.    Dispense:  180 tablet    Refill:  1  . losartan (COZAAR) 50 MG tablet    Sig: Take 1 tablet (50 mg total) by mouth daily.    Dispense:  90 tablet    Refill:  3    90 day supply on all meds  . atorvastatin (LIPITOR) 20 MG tablet    Sig: Take 1 tablet (20 mg total) by mouth daily.    Dispense:  90 tablet    Refill:  1  . Insulin Syringe-Needle U-100 (BD INSULIN SYRINGE ULTRAFINE) 31G X 15/64" 0.5 ML MISC    Sig: 1 each by Does not apply route 2 (two) times daily.    Dispense:  100 each    Refill:  5    Follow-up: Return in about 2 weeks (around 06/08/2015) for Follow-up on diabetes mellitus.   Jaclyn Shaggy MD

## 2015-05-30 ENCOUNTER — Other Ambulatory Visit: Payer: Self-pay | Admitting: *Deleted

## 2015-05-30 DIAGNOSIS — M545 Low back pain, unspecified: Secondary | ICD-10-CM

## 2015-05-30 DIAGNOSIS — G8929 Other chronic pain: Secondary | ICD-10-CM

## 2015-05-30 MED ORDER — TRAMADOL HCL 50 MG PO TABS: 50.0000 mg | ORAL_TABLET | Freq: Every day | ORAL | Status: DC

## 2015-05-30 MED FILL — traMADol HCL 50 MG TABS: 50 | 30 days supply | Qty: 30 | Fill #0

## 2015-05-30 NOTE — Telephone Encounter (Signed)
Came into clinic asking for Tramadol refill-MD approved

## 2015-06-22 MED FILL — glipiZIDE 10 MG TABS: 10 | 30 days supply | Qty: 60 | Fill #5

## 2015-06-22 MED FILL — ?SPIRONOLACTONE 25 MG TABLE: 25 | 30 days supply | Qty: 30 | Fill #2

## 2015-06-22 MED FILL — traMADol HCL 50 MG TABS: 50 | 30 days supply | Qty: 30 | Fill #1

## 2015-06-22 MED FILL — LOSARTAN POTASSIUM 50 MG TA: 50 | 30 days supply | Qty: 30 | Fill #2

## 2015-06-22 MED FILL — NOVOLIN 70/30 100 UNITS/ML: (70-30) 100 | 40 days supply | Qty: 20 | Fill #1

## 2015-06-22 MED FILL — ATORVASTATIN 20 MG TABLET: 20 | 30 days supply | Qty: 30 | Fill #1

## 2015-06-22 MED FILL — CARVEDILOL 12.5 MG TABLET: 12.5 | 30 days supply | Qty: 60 | Fill #5

## 2015-07-16 MED FILL — CARVEDILOL 12.5 MG TABLET: 12.5 | 30 days supply | Qty: 60 | Fill #0

## 2015-07-17 MED FILL — LOSARTAN POTASSIUM 50 MG TA: 50 | 30 days supply | Qty: 30 | Fill #3

## 2015-07-17 MED FILL — ATORVASTATIN 20 MG TABLET: 20 | 30 days supply | Qty: 30 | Fill #2

## 2015-07-17 MED FILL — ?SPIRONOLACTONE 25 MG TABLE: 25 | 30 days supply | Qty: 30 | Fill #3

## 2015-07-17 MED FILL — traMADol HCL 50 MG TABS: 50 | 30 days supply | Qty: 30 | Fill #2

## 2015-07-17 MED FILL — glipiZIDE 10 MG TABS: 10 | 30 days supply | Qty: 60 | Fill #0

## 2015-08-20 MED FILL — LOSARTAN POTASSIUM 50 MG TA: 50 | 30 days supply | Qty: 30 | Fill #4

## 2015-08-20 MED FILL — traMADol HCL 50 MG TABS: 50 | 30 days supply | Qty: 30 | Fill #3

## 2015-08-20 MED FILL — CARVEDILOL 12.5 MG TABLET: 12.5 | 30 days supply | Qty: 60 | Fill #1

## 2015-08-20 MED FILL — glipiZIDE 10 MG TABS: 10 | 30 days supply | Qty: 60 | Fill #1

## 2015-08-20 MED FILL — SPIRONOLACTONE 25 MG TABLET: 25 | 30 days supply | Qty: 30 | Fill #4

## 2015-08-20 MED FILL — ?ATORVASTATIN 20 MG TABLET: 20 | 30 days supply | Qty: 30 | Fill #3

## 2015-09-07 MED FILL — NOVOLIN 70/30 100 UNITS/ML: (70-30) 100 | 40 days supply | Qty: 20 | Fill #2

## 2015-09-21 ENCOUNTER — Other Ambulatory Visit: Payer: Self-pay | Admitting: Family Medicine

## 2015-09-21 MED FILL — CARVEDILOL 12.5 MG TABLET: 12.5 | 30 days supply | Qty: 60 | Fill #2

## 2015-09-21 MED FILL — glipiZIDE 10 MG TABS: 10 | 30 days supply | Qty: 60 | Fill #2

## 2015-09-21 MED FILL — LOSARTAN POTASSIUM 50 MG TA: 50 | 30 days supply | Qty: 30 | Fill #5

## 2015-09-21 MED FILL — ATORVASTATIN 20 MG TABLET: 20 | 30 days supply | Qty: 30 | Fill #0

## 2015-09-24 NOTE — Telephone Encounter (Signed)
He is due for an office visit and I will refill at that time

## 2015-09-26 ENCOUNTER — Ambulatory Visit: Payer: Self-pay | Admitting: Family Medicine

## 2015-09-26 ENCOUNTER — Other Ambulatory Visit: Payer: Self-pay | Admitting: Family Medicine

## 2015-09-26 DIAGNOSIS — I5022 Chronic systolic (congestive) heart failure: Secondary | ICD-10-CM

## 2015-09-26 MED ORDER — SPIRONOLACTONE 25 MG PO TABS: 25.0000 mg | ORAL_TABLET | Freq: Every day | ORAL | Status: DC

## 2015-09-26 NOTE — Telephone Encounter (Signed)
Pt called requesting med refill on spironolactone (ALDACTONE) 25 MG tablet

## 2015-09-26 NOTE — Telephone Encounter (Signed)
Pt called requesting medication refill on spironolactone (ALDACTONE) 25 MG tablet, please f/up

## 2015-09-26 NOTE — Telephone Encounter (Signed)
Refilled spironolactone x 30 days - patient must have office visit for refills

## 2015-09-28 ENCOUNTER — Ambulatory Visit: Payer: Self-pay

## 2015-10-08 MED FILL — SPIRONOLACTONE 25 MG TABLET: 25 | 30 days supply | Qty: 30 | Fill #0

## 2015-10-09 ENCOUNTER — Encounter: Payer: Self-pay | Admitting: Family Medicine

## 2015-10-09 ENCOUNTER — Ambulatory Visit: Payer: Self-pay | Attending: Family Medicine | Admitting: Family Medicine

## 2015-10-09 DIAGNOSIS — I5022 Chronic systolic (congestive) heart failure: Secondary | ICD-10-CM

## 2015-10-09 DIAGNOSIS — I10 Essential (primary) hypertension: Secondary | ICD-10-CM

## 2015-10-09 DIAGNOSIS — Z794 Long term (current) use of insulin: Secondary | ICD-10-CM

## 2015-10-09 DIAGNOSIS — K0889 Other specified disorders of teeth and supporting structures: Secondary | ICD-10-CM

## 2015-10-09 DIAGNOSIS — L559 Sunburn, unspecified: Secondary | ICD-10-CM

## 2015-10-09 DIAGNOSIS — E119 Type 2 diabetes mellitus without complications: Secondary | ICD-10-CM

## 2015-10-09 DIAGNOSIS — E785 Hyperlipidemia, unspecified: Secondary | ICD-10-CM

## 2015-10-09 LAB — POCT GLYCOSYLATED HEMOGLOBIN (HGB A1C): HEMOGLOBIN A1C: 8.9

## 2015-10-09 LAB — GLUCOSE, POCT (MANUAL RESULT ENTRY): POC Glucose: 237 mg/dl — AB (ref 70–99)

## 2015-10-09 MED ORDER — LIRAGLUTIDE 18 MG/3ML ~~LOC~~ SOPN
PEN_INJECTOR | SUBCUTANEOUS | Status: DC
Start: 2015-10-09 — End: 2016-05-13

## 2015-10-09 MED ORDER — LOSARTAN POTASSIUM 50 MG PO TABS: 50.0000 mg | ORAL_TABLET | Freq: Every day | ORAL | Status: DC

## 2015-10-09 MED ORDER — AMOXICILLIN 500 MG PO CAPS: 500.0000 mg | ORAL_CAPSULE | Freq: Three times a day (TID) | ORAL | Status: DC

## 2015-10-09 MED ORDER — CARVEDILOL 25 MG PO TABS: 25.0000 mg | ORAL_TABLET | Freq: Two times a day (BID) | ORAL | Status: DC

## 2015-10-09 MED ORDER — INSULIN NPH ISOPHANE & REGULAR (70-30) 100 UNIT/ML ~~LOC~~ SUSP: 25.0000 [IU] | Freq: Two times a day (BID) | SUBCUTANEOUS | Status: DC

## 2015-10-09 MED ORDER — GLIPIZIDE 10 MG PO TABS: 10.0000 mg | ORAL_TABLET | Freq: Two times a day (BID) | ORAL | Status: DC

## 2015-10-09 MED ORDER — SILVER SULFADIAZINE 1 % EX CREA: 1.0000 "application " | TOPICAL_CREAM | Freq: Every day | CUTANEOUS | Status: DC

## 2015-10-09 MED ORDER — ATORVASTATIN CALCIUM 20 MG PO TABS: 20.0000 mg | ORAL_TABLET | Freq: Every day | ORAL | Status: DC

## 2015-10-09 MED ORDER — SPIRONOLACTONE 25 MG PO TABS: 25.0000 mg | ORAL_TABLET | Freq: Every day | ORAL | Status: DC

## 2015-10-09 MED ORDER — CARVEDILOL 12.5 MG PO TABS: 12.5000 mg | ORAL_TABLET | Freq: Two times a day (BID) | ORAL | Status: DC

## 2015-10-09 MED FILL — !NOVOLIN 70/30 100 UNITS/ML: (70-30) 100 | 20 days supply | Qty: 10 | Fill #0

## 2015-10-09 MED FILL — ?GLIPIZIDE 10 MG TABLET: 10 | 30 days supply | Qty: 60 | Fill #0

## 2015-10-09 MED FILL — ?ATORVASTATIN 20 MG TABLET: 20 | 30 days supply | Qty: 30 | Fill #0

## 2015-10-09 MED FILL — !VICTOZA 18MG/3ML INJECT: 18 | 26 days supply | Qty: 6 | Fill #0

## 2015-10-09 MED FILL — CARVEDILOL 25 MG TABLET: 25 | 30 days supply | Qty: 60 | Fill #0

## 2015-10-09 MED FILL — SSD 1% CREAM: 1 | 20 days supply | Qty: 50 | Fill #0

## 2015-10-09 MED FILL — AMOXICILLIN 500 MG CAPSULE: 500 | 10 days supply | Qty: 30 | Fill #0

## 2015-10-09 NOTE — Progress Notes (Signed)
Subjective:  Patient ID: Dave Arnold, male    DOB: [birth year]  Age: 45 y.o. MRN: [id number]  CC: Follow-up; Sunburn; and Dental Pain   HPI Dave Arnold is a 45 year old male with a history of type 2 diabetes mellitus (A1c 8.9), hypertension, hyperlipidemia, CHF (EF 50-55%), obesity who comes into the clinic for a follow-up visit. His A1c has trended up from 6.9 previously and he endorses being out of his Victoza for the last 3 weeks but has been taking his Novolin 70/30, 25 units twice daily as well as his glipizide and denies hypoglycemic episodes. He has not been compliant with exercise or a diabetic diet.  His blood pressure is elevated which he attributes to being stressed.  Complains of tooth ache and would like to have an antibiotic for this; he is in the process of applying for [facility] card to help him with that and a referral. Complains of sunburn in both legs, worse on the left; he applied aloe vera cream to it with no improvement in symptoms.    Outpatient Prescriptions Prior to Visit  Medication Sig Dispense Refill  . aspirin EC 81 MG tablet Take 81 mg by mouth daily.     Marland Kitchen atorvastatin (LIPITOR) 20 MG tablet Take 1 tablet (20 mg total) by mouth daily. 90 tablet 1  . carvedilol (COREG) 12.5 MG tablet Take 1 tablet (12.5 mg total) by mouth 2 (two) times daily with a meal. 180 tablet 1  . glipiZIDE (GLUCOTROL) 10 MG tablet Take 1 tablet (10 mg total) by mouth 2 (two) times daily before a meal. 180 tablet 3  . insulin NPH-regular Human (NOVOLIN 70/30 RELION) (70-30) 100 UNIT/ML injection Inject 25 Units into the skin daily with supper. 90 mL 3  . losartan (COZAAR) 50 MG tablet Take 1 tablet (50 mg total) by mouth daily. 90 tablet 3  . spironolactone (ALDACTONE) 25 MG tablet Take 1 tablet (25 mg total) by mouth daily. Must have office visit for refills 30 tablet 0  . traMADol (ULTRAM) 50 MG tablet Take 1 tablet (50 mg total) by mouth at bedtime. 60 tablet 1  .  EPINEPHrine (EPIPEN 2-PAK) 0.3 mg/0.3 mL IJ SOAJ injection Inject 0.3 mLs (0.3 mg total) into the muscle once. (Patient not taking: Reported on 10/09/2015) 1 Device 0  . Insulin Syringe-Needle U-100 (BD INSULIN SYRINGE ULTRAFINE) 31G X 15/64" 0.5 ML MISC 1 each by Does not apply route 2 (two) times daily. (Patient not taking: Reported on 10/09/2015) 100 each 5  . Liraglutide 18 MG/3ML SOPN Inject 0.6 mg for1, week then 1.2 mg for1 week, then 1.8 mg daily (Patient not taking: Reported on 10/09/2015) 6 mL 3   No facility-administered medications prior to visit.    ROS Review of Systems  Constitutional: Negative for activity change and appetite change.  HENT: Negative for sinus pressure and sore throat.   Eyes: Negative for visual disturbance.  Respiratory: Negative for cough, chest tightness and shortness of breath.   Cardiovascular: Negative for chest pain and leg swelling.  Gastrointestinal: Negative for abdominal pain, diarrhea, constipation and abdominal distention.  Endocrine: Negative.   Genitourinary: Negative for dysuria.  Musculoskeletal: Negative for myalgias and joint swelling.  Skin: Negative for rash.  Allergic/Immunologic: Negative.   Neurological: Negative for weakness, light-headedness and numbness.  Psychiatric/Behavioral: Negative for suicidal ideas and dysphoric mood.    Objective:  BP 181/114 mmHg  Pulse 69  Temp(Src) 98.4 F (36.9 C) (Oral)  Wt 321 lb 6.4 oz (  145.786 kg)  SpO2 97%  BP/Weight 10/09/2015 05/25/2015 04/25/2015  Systolic BP 181 153 171  Diastolic BP 114 95 53  Wt. (Lbs) 321.4 318 321.8  BMI 43.58 43.12 43.63      Physical Exam Constitutional: He is oriented to person, place, and time. He appears well-developed and well-nourished.  Mobility obese  Cardiovascular: Normal rate, normal heart sounds and intact distal pulses.   No murmur heard. Pulmonary/Chest: Effort normal and breath sounds normal. He has no wheezes. He has no rales. He exhibits no  tenderness.  Abdominal: Soft. Bowel sounds are normal. He exhibits no distension and no mass. There is no tenderness.  Musculoskeletal: Normal range of motion.  Neurological: He is alert and oriented to person, place, and time.  Skin: Erythema of bilateral shin with blister formation on the left shin  Psychiatric: He has a normal mood and affect.   CMP Latest Ref Rng 04/26/2015 12/05/2014 06/27/2014  Glucose 65 - 99 mg/dL 161(W) 960(A) 93  BUN 7 - 25 mg/dL 12 16 12   Creatinine 0.60 - 1.35 mg/dL 5.40 9.81 1.91  Sodium 135 - 146 mmol/L 145 140 142  Potassium 3.5 - 5.3 mmol/L 3.9 3.7 4.5  Chloride 98 - 110 mmol/L 106 105 104  CO2 20 - 31 mmol/L 29 25 31   Calcium 8.6 - 10.3 mg/dL 9.1 9.0 9.6  Total Protein 6.1 - 8.1 g/dL 6.7 - 7.7  Total Bilirubin 0.2 - 1.2 mg/dL 0.5 - 0.7  Alkaline Phos 40 - 115 U/L 70 - 75  AST 10 - 40 U/L 33 - 22  ALT 9 - 46 U/L 38 - 26     Lipid Panel     Component Value Date/Time   CHOL 151 04/26/2015 1054   TRIG 230* 04/26/2015 1054   HDL 22* 04/26/2015 1054   CHOLHDL 6.9* 04/26/2015 1054   VLDL 46* 04/26/2015 1054   LDLCALC 83 04/26/2015 1054     Assessment & Plan:   1. Chronic systolic congestive heart failure (HCC) No evidence of acute exacerbation Low-sodium, daily weights - spironolactone (ALDACTONE) 25 MG tablet; Take 1 tablet (25 mg total) by mouth daily. Must have office visit for refills  Dispense: 90 tablet; Refill: 1  2. Essential hypertension Repeat blood pressure performed manually is 160/90 Increase carvedilol to 25 mg twice daily. - losartan (COZAAR) 50 MG tablet; Take 1 tablet (50 mg total) by mouth daily.  Dispense: 90 tablet; Refill: 1 - carvedilol (COREG) 25 MG tablet; Take 1 tablet (25 mg total) by mouth 2 (two) times daily with a meal.  Dispense: 180 tablet; Refill: 1  3. Type 2 diabetes mellitus without complication, with long-term current use of insulin (HCC) Uncontrolled with A1c trending up from 6.9 previously to 8.9  today This is due to the fact that he has been out of Victoza which I have refilled. - Glucose (CBG) - POCT A1C - Liraglutide 18 MG/3ML SOPN; Inject 0.6 mg for1, week then 1.2 mg for1 week, then 1.8 mg daily  Dispense: 6 mL; Refill: 3 - insulin NPH-regular Human (NOVOLIN 70/30 RELION) (70-30) 100 UNIT/ML injection; Inject 25 Units into the skin 2 (two) times daily with a meal.  Dispense: 90 mL; Refill: 1 - glipiZIDE (GLUCOTROL) 10 MG tablet; Take 1 tablet (10 mg total) by mouth 2 (two) times daily before a meal.  Dispense: 180 tablet; Refill: 1 - COMPLETE METABOLIC PANEL WITH GFR  4. Hyperlipidemia LDL at goal but elevated triglycerides - atorvastatin (LIPITOR) 20 MG tablet; Take  1 tablet (20 mg total) by mouth daily.  Dispense: 90 tablet; Refill: 1  5. Tooth ache He will need to see a dentist - amoxicillin (AMOXIL) 500 MG capsule; Take 1 capsule (500 mg total) by mouth 3 (three) times daily.  Dispense: 30 capsule; Refill: 0  6. Sunburn - silver sulfADIAZINE (SILVADENE) 1 % cream; Apply 1 application topically daily.  Dispense: 50 g; Refill: 0   Meds ordered this encounter  Medications  . spironolactone (ALDACTONE) 25 MG tablet    Sig: Take 1 tablet (25 mg total) by mouth daily. Must have office visit for refills    Dispense:  90 tablet    Refill:  1  . losartan (COZAAR) 50 MG tablet    Sig: Take 1 tablet (50 mg total) by mouth daily.    Dispense:  90 tablet    Refill:  1    90 day supply on all meds  . Liraglutide 18 MG/3ML SOPN    Sig: Inject 0.6 mg for1, week then 1.2 mg for1 week, then 1.8 mg daily    Dispense:  6 mL    Refill:  3  . insulin NPH-regular Human (NOVOLIN 70/30 RELION) (70-30) 100 UNIT/ML injection    Sig: Inject 25 Units into the skin 2 (two) times daily with a meal.    Dispense:  90 mL    Refill:  1    Discontinue 20 units  . glipiZIDE (GLUCOTROL) 10 MG tablet    Sig: Take 1 tablet (10 mg total) by mouth 2 (two) times daily before a meal.    Dispense:   180 tablet    Refill:  1  . carvedilol (COREG) 12.5 MG tablet    Sig: Take 1 tablet (12.5 mg total) by mouth 2 (two) times daily with a meal.    Dispense:  180 tablet    Refill:  1  . atorvastatin (LIPITOR) 20 MG tablet    Sig: Take 1 tablet (20 mg total) by mouth daily.    Dispense:  90 tablet    Refill:  1  . amoxicillin (AMOXIL) 500 MG capsule    Sig: Take 1 capsule (500 mg total) by mouth 3 (three) times daily.    Dispense:  30 capsule    Refill:  0  . silver sulfADIAZINE (SILVADENE) 1 % cream    Sig: Apply 1 application topically daily.    Dispense:  50 g    Refill:  0    Follow-up: Return in about 3 months (around 01/09/2016) for Follow-up on diabetes mellitus.   Jaclyn ShaggyEnobong Amao MD

## 2015-10-09 NOTE — Patient Instructions (Signed)
Diabetes Mellitus and Food It is important for you to manage your blood sugar (glucose) level. Your blood glucose level can be greatly affected by what you eat. Eating healthier foods in the appropriate amounts throughout the day at about the same time each day will help you control your blood glucose level. It can also help slow or prevent worsening of your diabetes mellitus. Healthy eating may even help you improve the level of your blood pressure and reach or maintain a healthy weight.  General recommendations for healthful eating and cooking habits include:  Eating meals and snacks regularly. Avoid going long periods of time without eating to lose weight.  Eating a diet that consists mainly of plant-based foods, such as fruits, vegetables, nuts, legumes, and whole grains.  Using low-heat cooking methods, such as baking, instead of high-heat cooking methods, such as deep frying. Work with your dietitian to make sure you understand how to use the Nutrition Facts information on food labels. HOW CAN FOOD AFFECT ME? Carbohydrates Carbohydrates affect your blood glucose level more than any other type of food. Your dietitian will help you determine how many carbohydrates to eat at each meal and teach you how to count carbohydrates. Counting carbohydrates is important to keep your blood glucose at a healthy level, especially if you are using insulin or taking certain medicines for diabetes mellitus. Alcohol Alcohol can cause sudden decreases in blood glucose (hypoglycemia), especially if you use insulin or take certain medicines for diabetes mellitus. Hypoglycemia can be a life-threatening condition. Symptoms of hypoglycemia (sleepiness, dizziness, and disorientation) are similar to symptoms of having too much alcohol.  If your health care provider has given you approval to drink alcohol, do so in moderation and use the following guidelines:  Women should not have more than one drink per day, and men  should not have more than two drinks per day. One drink is equal to:  12 oz of beer.  5 oz of wine.  1 oz of hard liquor.  Do not drink on an empty stomach.  Keep yourself hydrated. Have water, diet soda, or unsweetened iced tea.  Regular soda, juice, and other mixers might contain a lot of carbohydrates and should be counted. WHAT FOODS ARE NOT RECOMMENDED? As you make food choices, it is important to remember that all foods are not the same. Some foods have fewer nutrients per serving than other foods, even though they might have the same number of calories or carbohydrates. It is difficult to get your body what it needs when you eat foods with fewer nutrients. Examples of foods that you should avoid that are high in calories and carbohydrates but low in nutrients include:  Trans fats (most processed foods list trans fats on the Nutrition Facts label).  Regular soda.  Juice.  Candy.  Sweets, such as cake, pie, doughnuts, and cookies.  Fried foods. WHAT FOODS CAN I EAT? Eat nutrient-rich foods, which will nourish your body and keep you healthy. The food you should eat also will depend on several factors, including:  The calories you need.  The medicines you take.  Your weight.  Your blood glucose level.  Your blood pressure level.  Your cholesterol level. You should eat a variety of foods, including:  Protein.  Lean cuts of meat.  Proteins low in saturated fats, such as fish, egg whites, and beans. Avoid processed meats.  Fruits and vegetables.  Fruits and vegetables that may help control blood glucose levels, such as apples, mangoes, and   yams.  Dairy products.  Choose fat-free or low-fat dairy products, such as milk, yogurt, and cheese.  Grains, bread, pasta, and rice.  Choose whole grain products, such as multigrain bread, whole oats, and brown rice. These foods may help control blood pressure.  Fats.  Foods containing healthful fats, such as nuts,  avocado, olive oil, canola oil, and fish. DOES EVERYONE WITH DIABETES MELLITUS HAVE THE SAME MEAL PLAN? Because every person with diabetes mellitus is different, there is not one meal plan that works for everyone. It is very important that you meet with a dietitian who will help you create a meal plan that is just right for you.   This information is not intended to replace advice given to you by your health care provider. Make sure you discuss any questions you have with your health care provider.   Document Released: 12/12/2004 Document Revised: 04/07/2014 Document Reviewed: 02/11/2013 Elsevier Interactive Patient Education 2016 [employer] Inc[employer]  

## 2015-10-10 ENCOUNTER — Ambulatory Visit: Payer: Self-pay | Attending: Family Medicine

## 2015-10-10 LAB — COMPLETE METABOLIC PANEL WITH GFR
ALBUMIN: 3.9 g/dL (ref 3.6–5.1)
ALK PHOS: 88 U/L (ref 40–115)
ALT: 24 U/L (ref 9–46)
AST: 30 U/L (ref 10–40)
BUN: 12 mg/dL (ref 7–25)
CALCIUM: 8.9 mg/dL (ref 8.6–10.3)
CO2: 25 mmol/L (ref 20–31)
Chloride: 106 mmol/L (ref 98–110)
Creat: 0.84 mg/dL (ref 0.60–1.35)
GFR, Est African American: >89 mL/min (ref >=60)
Glucose, Bld: 212 mg/dL — ABNORMAL HIGH (ref 65–99)
POTASSIUM: 3.7 mmol/L (ref 3.5–5.3)
SODIUM: 143 mmol/L (ref 135–146)
Total Bilirubin: 0.7 mg/dL (ref 0.2–1.2)
Total Protein: 6.4 g/dL (ref 6.1–8.1)

## 2015-10-11 ENCOUNTER — Telehealth: Payer: Self-pay

## 2015-10-11 NOTE — Telephone Encounter (Signed)
-----   Message from Jaclyn ShaggyEnobong Amao, MD sent at 10/10/2015  4:44 PM EDT ----- Labs revealed mildly elevated glucose. No change in regimen

## 2015-10-11 NOTE — Telephone Encounter (Signed)
Called patient. No answer. Will try later.  

## 2015-10-12 NOTE — Telephone Encounter (Signed)
Called patient. No answer. Sent letter.  

## 2015-11-06 MED FILL — ?SPIRONOLACTONE 25 MG TABLE: 25 | 30 days supply | Qty: 30 | Fill #0

## 2015-11-07 MED FILL — LOSARTAN POTASSIUM 50 MG TA: 50 | 30 days supply | Qty: 30 | Fill #0

## 2015-11-26 MED FILL — ?CARVEDILOL 25 MG TABLET: 25 | 30 days supply | Qty: 60 | Fill #1

## 2015-11-26 MED FILL — ?ATORVASTATIN 20 MG TABLET: 20 | 30 days supply | Qty: 30 | Fill #1

## 2015-11-26 MED FILL — ?GLIPIZIDE 10 MG TABLET: 10 | 30 days supply | Qty: 60 | Fill #1

## 2015-11-28 MED FILL — !VICTOZA 18MG/3ML INJECT: 18 | 26 days supply | Qty: 6 | Fill #1

## 2015-11-28 MED FILL — SPIRONOLACTONE 25 MG TABLET: 25 | 30 days supply | Qty: 30 | Fill #1

## 2015-11-28 MED FILL — LOSARTAN POTASSIUM 50 MG TA: 50 | 30 days supply | Qty: 30 | Fill #1

## 2015-11-30 MED FILL — !NOVOLIN 70/30 100 UNITS/ML: (70-30) 100 | 20 days supply | Qty: 10 | Fill #1

## 2015-12-26 MED FILL — !VICTOZA 18MG/3ML INJECT: 18 | 26 days supply | Qty: 6 | Fill #2

## 2016-01-03 MED FILL — LOSARTAN POTASSIUM 50 MG TA: 50 | 30 days supply | Qty: 30 | Fill #2

## 2016-01-03 MED FILL — glipiZIDE 10 MG TABS: 10 | 30 days supply | Qty: 60 | Fill #2

## 2016-01-03 MED FILL — SPIRONOLACTONE 25 MG TABLET: 25 | 30 days supply | Qty: 30 | Fill #2

## 2016-01-03 MED FILL — NOVOLIN 70/30 100 UNITS/ML: (70-30) 100 | 20 days supply | Qty: 10 | Fill #2

## 2016-01-03 MED FILL — CARVEDILOL 25 MG TABLET: 25 | 30 days supply | Qty: 60 | Fill #2

## 2016-02-11 MED FILL — $Humulin 70/30 vial: (70-30) 100 | 20 days supply | Qty: 10 | Fill #3

## 2016-02-11 MED FILL — glipiZIDE 10 MG TABS: 10 | 30 days supply | Qty: 60 | Fill #3

## 2016-02-11 MED FILL — LOSARTAN POTASSIUM 50 MG TA: 50 | 30 days supply | Qty: 30 | Fill #3

## 2016-02-11 MED FILL — SPIRONOLACTONE 25 MG TABLET: 25 | 30 days supply | Qty: 30 | Fill #3

## 2016-02-11 MED FILL — CARVEDILOL 25 MG TABLET: 25 | 30 days supply | Qty: 60 | Fill #3

## 2016-03-17 MED FILL — $novoLIN 70/30 100 UNITS/ML: (70-30) 100 | 20 days supply | Qty: 10 | Fill #4

## 2016-03-17 MED FILL — CARVEDILOL 25 MG TABLET: 25 | 30 days supply | Qty: 60 | Fill #4

## 2016-03-17 MED FILL — LOSARTAN POTASSIUM 50 MG TA: 50 | 30 days supply | Qty: 30 | Fill #4

## 2016-03-17 MED FILL — glipiZIDE 10 MG TABS: 10 | 30 days supply | Qty: 60 | Fill #4

## 2016-03-17 MED FILL — SPIRONOLACTONE 25 MG TABLET: 25 | 30 days supply | Qty: 30 | Fill #4

## 2016-04-04 ENCOUNTER — Ambulatory Visit: Payer: Self-pay | Admitting: Family Medicine

## 2016-04-15 MED FILL — glipiZIDE 10 MG TABS: 10 | 30 days supply | Qty: 60 | Fill #5

## 2016-04-15 MED FILL — LOSARTAN POTASSIUM 50 MG TA: 50 | 30 days supply | Qty: 30 | Fill #5

## 2016-04-15 MED FILL — CARVEDILOL 25 MG TABLET: 25 | 30 days supply | Qty: 60 | Fill #5

## 2016-04-15 MED FILL — SPIRONOLACTONE 25 MG TABLET: 25 | 30 days supply | Qty: 30 | Fill #5

## 2016-04-18 MED FILL — !NOVOLIN 70/30 100 UNITS/ML: (70-30) 100 | 20 days supply | Qty: 10 | Fill #5

## 2016-04-22 ENCOUNTER — Ambulatory Visit: Payer: Self-pay

## 2016-05-13 ENCOUNTER — Ambulatory Visit: Payer: Self-pay | Attending: Family Medicine | Admitting: Family Medicine

## 2016-05-13 ENCOUNTER — Encounter: Payer: Self-pay | Admitting: Family Medicine

## 2016-05-13 VITALS — BP 166/69 | HR 56 | Temp 98.2°F | Ht 72.0 in | Wt 310.8 lb

## 2016-05-13 DIAGNOSIS — E119 Type 2 diabetes mellitus without complications: Secondary | ICD-10-CM | POA: Insufficient documentation

## 2016-05-13 DIAGNOSIS — Z888 Allergy status to other drugs, medicaments and biological substances status: Secondary | ICD-10-CM | POA: Insufficient documentation

## 2016-05-13 DIAGNOSIS — I110 Hypertensive heart disease with heart failure: Secondary | ICD-10-CM | POA: Insufficient documentation

## 2016-05-13 DIAGNOSIS — I10 Essential (primary) hypertension: Secondary | ICD-10-CM

## 2016-05-13 DIAGNOSIS — E669 Obesity, unspecified: Secondary | ICD-10-CM | POA: Insufficient documentation

## 2016-05-13 DIAGNOSIS — J40 Bronchitis, not specified as acute or chronic: Secondary | ICD-10-CM | POA: Insufficient documentation

## 2016-05-13 DIAGNOSIS — Z7982 Long term (current) use of aspirin: Secondary | ICD-10-CM | POA: Insufficient documentation

## 2016-05-13 DIAGNOSIS — I509 Heart failure, unspecified: Secondary | ICD-10-CM | POA: Insufficient documentation

## 2016-05-13 DIAGNOSIS — J029 Acute pharyngitis, unspecified: Secondary | ICD-10-CM | POA: Insufficient documentation

## 2016-05-13 DIAGNOSIS — Z794 Long term (current) use of insulin: Secondary | ICD-10-CM | POA: Insufficient documentation

## 2016-05-13 DIAGNOSIS — Z79899 Other long term (current) drug therapy: Secondary | ICD-10-CM | POA: Insufficient documentation

## 2016-05-13 DIAGNOSIS — R197 Diarrhea, unspecified: Secondary | ICD-10-CM | POA: Insufficient documentation

## 2016-05-13 MED ORDER — AZITHROMYCIN 250 MG PO TABS: ORAL_TABLET | ORAL | 0 refills | Status: DC

## 2016-05-13 MED ORDER — PROMETHAZINE-DM 6.25-15 MG/5ML PO SYRP: 5.0000 mL | ORAL_SOLUTION | Freq: Four times a day (QID) | ORAL | 0 refills | Status: DC | PRN

## 2016-05-13 MED FILL — PROMETHAZINE-DM SYRUP: 6.25-15 | 9 days supply | Qty: 180 | Fill #0

## 2016-05-13 MED FILL — AZITHROMYCIN 250 MG TABLET: 250 | 5 days supply | Qty: 6 | Fill #0

## 2016-05-13 NOTE — Progress Notes (Signed)
Subjective:  Patient ID: Dave Arnold, male    DOB: 1971-02-07  Age: 46 y.o. MRN: [id number]  CC: Cough (productive- yellow in color); Nasal Congestion; Sore Throat; Wheezing; Diabetes; and Diarrhea   HPI Dave Arnold presents Complaining of a two-week history of cough (worse at night) productive of yellow sputum, nasal congestion, dry throat and developed diarrhea yesterday. He complains of a sensation of alternating heat and cold and over-the-counter medications have not helped. He denies fever and states "he just feels bad". Denies history of sick contacts.  Rectal history significant for hypertension, type 2 diabetes mellitus with A1c of 8.9.  Past Medical History:  Diagnosis Date  . Diabetes mellitus without complication (HCC)   . Heart failure of unknown type (HCC) 06/2011  . Hypertension   . Obesity   . Thrombocytopenia (HCC)     History reviewed. No pertinent surgical history.  Allergies  Allergen Reactions  . Lisinopril     cough     Outpatient Medications Prior to Visit  Medication Sig Dispense Refill  . aspirin EC 81 MG tablet Take 81 mg by mouth daily.     Marland Kitchen atorvastatin (LIPITOR) 20 MG tablet Take 1 tablet (20 mg total) by mouth daily. 90 tablet 1  . carvedilol (COREG) 25 MG tablet Take 1 tablet (25 mg total) by mouth 2 (two) times daily with a meal. 180 tablet 1  . glipiZIDE (GLUCOTROL) 10 MG tablet Take 1 tablet (10 mg total) by mouth 2 (two) times daily before a meal. 180 tablet 1  . insulin NPH-regular Human (NOVOLIN 70/30 RELION) (70-30) 100 UNIT/ML injection Inject 25 Units into the skin 2 (two) times daily with a meal. 90 mL 1  . Insulin Syringe-Needle U-100 (BD INSULIN SYRINGE ULTRAFINE) 31G X 15/64" 0.5 ML MISC 1 each by Does not apply route 2 (two) times daily. 100 each 5  . losartan (COZAAR) 50 MG tablet Take 1 tablet (50 mg total) by mouth daily. 90 tablet 1  . spironolactone (ALDACTONE) 25 MG tablet Take 1 tablet (25 mg total) by mouth  daily. Must have office visit for refills 90 tablet 1  . EPINEPHrine (EPIPEN 2-PAK) 0.3 mg/0.3 mL IJ SOAJ injection Inject 0.3 mLs (0.3 mg total) into the muscle once. (Patient not taking: Reported on 10/09/2015) 1 Device 0  . silver sulfADIAZINE (SILVADENE) 1 % cream Apply 1 application topically daily. (Patient not taking: Reported on 05/13/2016) 50 g 0  . amoxicillin (AMOXIL) 500 MG capsule Take 1 capsule (500 mg total) by mouth 3 (three) times daily. 30 capsule 0  . Liraglutide 18 MG/3ML SOPN Inject 0.6 mg for1, week then 1.2 mg for1 week, then 1.8 mg daily 6 mL 3   No facility-administered medications prior to visit.     ROS Review of Systems  Constitutional: Negative for activity change and appetite change.  HENT: Positive for congestion and sore throat.   Respiratory: Positive for cough, shortness of breath and wheezing. Negative for chest tightness.   Cardiovascular: Negative for chest pain and palpitations.  Gastrointestinal: Negative for abdominal distention, abdominal pain and constipation.  Genitourinary: Negative.   Musculoskeletal: Negative.   Psychiatric/Behavioral: Negative for behavioral problems and dysphoric mood.    Objective:  BP (!) 166/69 (BP Location: Right Arm, Patient Position: Sitting, Cuff Size: Large)   Pulse (!) 56   Temp 98.2 F (36.8 C) (Oral)   Ht 6' (1.829 m)   Wt (!) 310 lb 12.8 oz (141 kg)   SpO2 99%  BMI 42.15 kg/m   BP/Weight 05/13/2016 10/09/2015 05/25/2015  Systolic BP 166 160 153  Diastolic BP 69 90 95  Wt. (Lbs) 310.8 321.4 318  BMI 42.15 43.58 43.12      Physical Exam  Constitutional: He is oriented to person, place, and time. He appears well-developed and well-nourished.  Cardiovascular: Normal rate, normal heart sounds and intact distal pulses.   No murmur heard. Pulmonary/Chest: Effort normal and breath sounds normal. He has no wheezes. He has no rales. He exhibits no tenderness.  Abdominal: Soft. Bowel sounds are normal. He  exhibits no distension and no mass. There is no tenderness.  Musculoskeletal: Normal range of motion.  Neurological: He is alert and oriented to person, place, and time.     Assessment & Plan:   1. Bronchitis Will treat with abx given he is high risk due to history of Diabetes - azithromycin (ZITHROMAX) 250 MG tablet; Take 2 tabs (500 mg) orally on day 1 then 1 tablet (250 mg) on days 2-5  Dispense: 6 tablet; Refill: 0 - promethazine-dextromethorphan (PROMETHAZINE-DM) 6.25-15 MG/5ML syrup; Take 5 mLs by mouth 4 (four) times daily as needed for cough.  Dispense: 180 mL; Refill: 0 - Influenza A and B Ag, Immunoassay - Throat culture  2. Essential hypertension Uncontrolled Will make no regimen changes as elevation could be secondary to OTC decongestant use   Meds ordered this encounter  Medications  . azithromycin (ZITHROMAX) 250 MG tablet    Sig: Take 2 tabs (500 mg) orally on day 1 then 1 tablet (250 mg) on days 2-5    Dispense:  6 tablet    Refill:  0  . promethazine-dextromethorphan (PROMETHAZINE-DM) 6.25-15 MG/5ML syrup    Sig: Take 5 mLs by mouth 4 (four) times daily as needed for cough.    Dispense:  180 mL    Refill:  0    Follow-up: Return in about 2 weeks (around 05/27/2016) for follow up of chronic medical conditions.   Jaclyn ShaggyEnobong Amao MD

## 2016-05-14 LAB — INFLUENZA A AND B AG, IMMUNOASSAY
INFLUENZA A ANTIGEN: NOT DETECTED
INFLUENZA B ANTIGEN: NOT DETECTED

## 2016-05-16 LAB — CULTURE, UPPER RESPIRATORY: Organism ID, Bacteria: NORMAL

## 2016-05-19 ENCOUNTER — Telehealth: Payer: Self-pay | Admitting: *Deleted

## 2016-05-19 ENCOUNTER — Other Ambulatory Visit: Payer: Self-pay | Admitting: Family Medicine

## 2016-05-19 DIAGNOSIS — Z794 Long term (current) use of insulin: Secondary | ICD-10-CM

## 2016-05-19 DIAGNOSIS — E119 Type 2 diabetes mellitus without complications: Secondary | ICD-10-CM

## 2016-05-19 DIAGNOSIS — I10 Essential (primary) hypertension: Secondary | ICD-10-CM

## 2016-05-19 DIAGNOSIS — I5022 Chronic systolic (congestive) heart failure: Secondary | ICD-10-CM

## 2016-05-19 MED FILL — LOSARTAN POTASSIUM 50 MG TA: 50 | 90 days supply | Qty: 90 | Fill #0

## 2016-05-19 NOTE — Telephone Encounter (Signed)
-----   Message from Jaclyn ShaggyEnobong Amao, MD sent at 05/14/2016 12:50 PM EST ----- Labs are negative for influenza

## 2016-05-19 NOTE — Telephone Encounter (Signed)
Patient verified DOB Patient is aware of labs being negative for the flu. Patient states symptoms have subsided besides a dry throat. Patient advised to use lozenges for relief.

## 2016-05-19 NOTE — Telephone Encounter (Signed)
Medical Assistant left message on patient's home and cell voicemail. Voicemail states to give a call back to Nubia with [facility] at [phone].  

## 2016-05-20 MED FILL — ?CARVEDILOL 25 MG TABLET: 25 | 30 days supply | Qty: 60 | Fill #0

## 2016-05-20 MED FILL — glipiZIDE 10 MG TABS: 10 | 30 days supply | Qty: 60 | Fill #0

## 2016-05-20 MED FILL — SPIRONOLACTONE 25 MG TABLET: 25 | 30 days supply | Qty: 30 | Fill #0

## 2016-05-23 ENCOUNTER — Telehealth: Payer: Self-pay | Admitting: *Deleted

## 2016-05-23 NOTE — Telephone Encounter (Signed)
Medical Assistant left message on patient's home and cell voicemail. Voicemail states to give a call back to Lesley Galentine with [facility] at [phone].  

## 2016-05-23 NOTE — Telephone Encounter (Signed)
-----   Message from Jaclyn ShaggyEnobong Amao, MD sent at 05/16/2016 12:18 PM EST ----- Please inform the patient that labs are normal. Thank you.

## 2016-05-26 ENCOUNTER — Telehealth: Payer: Self-pay | Admitting: Family Medicine

## 2016-05-26 ENCOUNTER — Telehealth: Payer: Self-pay

## 2016-05-26 NOTE — Telephone Encounter (Signed)
Pt inquiring about his insulin. Pt picked up his medication refills but his insulin was not included Pt wants to know if PCP took him off insulin.  Pt has appointment tomorrow

## 2016-05-26 NOTE — Telephone Encounter (Signed)
Writer called patient back regarding his question about insulin.  He has an appt with Dr. Venetia NightAmao in the morning.  MD can refill medication then.

## 2016-05-27 ENCOUNTER — Encounter: Payer: Self-pay | Admitting: Family Medicine

## 2016-05-27 ENCOUNTER — Ambulatory Visit: Payer: Self-pay | Attending: Family Medicine | Admitting: Family Medicine

## 2016-05-27 VITALS — BP 176/93 | HR 62 | Temp 98.2°F | Ht 72.0 in | Wt 318.6 lb

## 2016-05-27 DIAGNOSIS — E781 Pure hyperglyceridemia: Secondary | ICD-10-CM | POA: Insufficient documentation

## 2016-05-27 DIAGNOSIS — Z794 Long term (current) use of insulin: Secondary | ICD-10-CM | POA: Insufficient documentation

## 2016-05-27 DIAGNOSIS — Z888 Allergy status to other drugs, medicaments and biological substances status: Secondary | ICD-10-CM | POA: Insufficient documentation

## 2016-05-27 DIAGNOSIS — Z79899 Other long term (current) drug therapy: Secondary | ICD-10-CM | POA: Insufficient documentation

## 2016-05-27 DIAGNOSIS — R05 Cough: Secondary | ICD-10-CM | POA: Insufficient documentation

## 2016-05-27 DIAGNOSIS — I5022 Chronic systolic (congestive) heart failure: Secondary | ICD-10-CM | POA: Insufficient documentation

## 2016-05-27 DIAGNOSIS — Z7982 Long term (current) use of aspirin: Secondary | ICD-10-CM | POA: Insufficient documentation

## 2016-05-27 DIAGNOSIS — E7800 Pure hypercholesterolemia, unspecified: Secondary | ICD-10-CM | POA: Insufficient documentation

## 2016-05-27 DIAGNOSIS — E669 Obesity, unspecified: Secondary | ICD-10-CM | POA: Insufficient documentation

## 2016-05-27 DIAGNOSIS — I10 Essential (primary) hypertension: Secondary | ICD-10-CM

## 2016-05-27 DIAGNOSIS — E119 Type 2 diabetes mellitus without complications: Secondary | ICD-10-CM | POA: Insufficient documentation

## 2016-05-27 DIAGNOSIS — R634 Abnormal weight loss: Secondary | ICD-10-CM | POA: Insufficient documentation

## 2016-05-27 DIAGNOSIS — I110 Hypertensive heart disease with heart failure: Secondary | ICD-10-CM | POA: Insufficient documentation

## 2016-05-27 DIAGNOSIS — M546 Pain in thoracic spine: Secondary | ICD-10-CM | POA: Insufficient documentation

## 2016-05-27 LAB — COMPLETE METABOLIC PANEL WITH GFR
ALT: 27 U/L (ref 9–46)
AST: 25 U/L (ref 10–40)
Albumin: 3.8 g/dL (ref 3.6–5.1)
Alkaline Phosphatase: 84 U/L (ref 40–115)
BUN: 8 mg/dL (ref 7–25)
CHLORIDE: 105 mmol/L (ref 98–110)
CO2: 27 mmol/L (ref 20–31)
Calcium: 8.8 mg/dL (ref 8.6–10.3)
Creat: 0.84 mg/dL (ref 0.60–1.35)
GFR, Est African American: >89 mL/min (ref >=60)
GFR, Est Non African American: >89 mL/min (ref >=60)
GLUCOSE: 231 mg/dL — AB (ref 65–99)
POTASSIUM: 3.4 mmol/L — AB (ref 3.5–5.3)
SODIUM: 142 mmol/L (ref 135–146)
Total Bilirubin: 0.7 mg/dL (ref 0.2–1.2)
Total Protein: 6.8 g/dL (ref 6.1–8.1)

## 2016-05-27 LAB — LIPID PANEL W/REFLEX DIRECT LDL
Cholesterol: 135 mg/dL (ref <200)
HDL: 24 mg/dL — ABNORMAL LOW (ref >40)
LDL-CHOLESTEROL: 80 mg/dL
NON-HDL CHOLESTEROL (CALC): 111 mg/dL (ref <130)
Total CHOL/HDL Ratio: 5.6 Ratio — ABNORMAL HIGH (ref <5.0)
Triglycerides: 211 mg/dL — ABNORMAL HIGH (ref <150)

## 2016-05-27 LAB — GLUCOSE, POCT (MANUAL RESULT ENTRY): POC GLUCOSE: 242 mg/dL — AB (ref 70–99)

## 2016-05-27 LAB — POCT GLYCOSYLATED HEMOGLOBIN (HGB A1C): HEMOGLOBIN A1C: 10

## 2016-05-27 MED ORDER — LOSARTAN POTASSIUM 100 MG PO TABS: 100.0000 mg | ORAL_TABLET | Freq: Every day | ORAL | 1 refills | Status: DC

## 2016-05-27 MED ORDER — INSULIN ISOPHANE & REGULAR (HUMAN 70-30)100 UNIT/ML KWIKPEN: 35.0000 [IU] | PEN_INJECTOR | Freq: Two times a day (BID) | SUBCUTANEOUS | 1 refills | Status: DC

## 2016-05-27 MED ORDER — GLIPIZIDE 10 MG PO TABS: ORAL_TABLET | ORAL | 1 refills | Status: DC

## 2016-05-27 MED ORDER — INSULIN PEN NEEDLE 31G X 5 MM MISC: 1.0000 | Freq: Two times a day (BID) | 1 refills | Status: DC

## 2016-05-27 MED ORDER — CARVEDILOL 25 MG PO TABS: ORAL_TABLET | ORAL | 1 refills | Status: DC

## 2016-05-27 MED ORDER — CYCLOBENZAPRINE HCL 10 MG PO TABS: 10.0000 mg | ORAL_TABLET | Freq: Two times a day (BID) | ORAL | 1 refills | Status: DC | PRN

## 2016-05-27 MED ORDER — ATORVASTATIN CALCIUM 20 MG PO TABS: 20.0000 mg | ORAL_TABLET | Freq: Every day | ORAL | 1 refills | Status: DC

## 2016-05-27 MED ORDER — SPIRONOLACTONE 25 MG PO TABS: 25.0000 mg | ORAL_TABLET | Freq: Every day | ORAL | 0 refills | Status: DC

## 2016-05-27 MED FILL — ?CYCLOBENZAPRINE 10 MG TABL: 10 | 30 days supply | Qty: 60 | Fill #0

## 2016-05-27 MED FILL — TRUEPLUS PEN NDL 31GX3/16: 31G X 5 MM | 50 days supply | Qty: 100 | Fill #0

## 2016-05-27 MED FILL — !HUMULIN 70/30 KWIKPEN: (70-30) 100 | 12 days supply | Qty: 9 | Fill #0

## 2016-05-27 MED FILL — TRUEPLUS PEN NDL 31GX3/16": 31G X 5 MM | 50 days supply | Qty: 100 | Fill #0

## 2016-05-27 NOTE — Progress Notes (Signed)
Med refills- novolin(has none)  Patient is questioning what the pharmacy is doing. Is not getting the 90 days and they would'nt refill the novolin

## 2016-05-27 NOTE — Patient Instructions (Signed)
Diabetes Mellitus and Exercise Exercising regularly is important for your overall health, especially when you have diabetes (diabetes mellitus). Exercising is not only about losing weight. It has many health benefits, such as increasing muscle strength and bone density and reducing body fat and stress. This leads to improved fitness, flexibility, and endurance, all of which result in better overall health. Exercise has additional benefits for people with diabetes, including:  Reducing appetite.  Helping to lower and control blood glucose.  Lowering blood pressure.  Helping to control amounts of fatty substances (lipids) in the blood, such as cholesterol and triglycerides.  Helping the body to respond better to insulin (improving insulin sensitivity).  Reducing how much insulin the body needs.  Decreasing the risk for heart disease by:  Lowering cholesterol and triglyceride levels.  Increasing the levels of good cholesterol.  Lowering blood glucose levels. What is my activity plan? Your health care provider or certified diabetes educator can help you make a plan for the type and frequency of exercise (activity plan) that works for you. Make sure that you:  Do at least 150 minutes of moderate-intensity or vigorous-intensity exercise each week. This could be brisk walking, biking, or water aerobics.  Do stretching and strength exercises, such as yoga or weightlifting, at least 2 times a week.  Spread out your activity over at least 3 days of the week.  Get some form of physical activity every day.  Do not go more than 2 days in a row without some kind of physical activity.  Avoid being inactive for more than 90 minutes at a time. Take frequent breaks to walk or stretch.  Choose a type of exercise or activity that you enjoy, and set realistic goals.  Start slowly, and gradually increase the intensity of your exercise over time. What do I need to know about managing my  diabetes?  Check your blood glucose before and after exercising.  If your blood glucose is higher than 240 mg/dL (13.3 mmol/L) before you exercise, check your urine for ketones. If you have ketones in your urine, do not exercise until your blood glucose returns to normal.  Know the symptoms of low blood glucose (hypoglycemia) and how to treat it. Your risk for hypoglycemia increases during and after exercise. Common symptoms of hypoglycemia can include:  Hunger.  Anxiety.  Sweating and feeling clammy.  Confusion.  Dizziness or feeling light-headed.  Increased heart rate or palpitations.  Blurry vision.  Tingling or numbness around the mouth, lips, or tongue.  Tremors or shakes.  Irritability.  Keep a rapid-acting carbohydrate snack available before, during, and after exercise to help prevent or treat hypoglycemia.  Avoid injecting insulin into areas of the body that are going to be exercised. For example, avoid injecting insulin into:  The arms, when playing tennis.  The legs, when jogging.  Keep records of your exercise habits. Doing this can help you and your health care provider adjust your diabetes management plan as needed. Write down:  Food that you eat before and after you exercise.  Blood glucose levels before and after you exercise.  The type and amount of exercise you have done.  When your insulin is expected to peak, if you use insulin. Avoid exercising at times when your insulin is peaking.  When you start a new exercise or activity, work with your health care provider to make sure the activity is safe for you, and to adjust your insulin, medicines, or food intake as needed.  Drink plenty   of water while you exercise to prevent dehydration or heat stroke. Drink enough fluid to keep your urine clear or pale yellow. This information is not intended to replace advice given to you by your health care provider. Make sure you discuss any questions you have with  your health care provider. Document Released: 06/07/2003 Document Revised: 10/05/2015 Document Reviewed: 08/27/2015 Elsevier Interactive Patient Education  2017 [employer]  

## 2016-05-27 NOTE — Progress Notes (Signed)
Subjective:  Patient ID: Dave Arnold, male    DOB: July 08, 1970  Age: 46 y.o. MRN: [id number]  CC: Diabetes; Hypertension; and Back Pain   HPI Dave Arnold is a 46 year old male with a history of type 2 diabetes mellitus (A1c 10.0), hypertension, hyperlipidemia, CHF (EF 50-55%), obesity who comes into the clinic for a follow-up visit.  He endorses compliance with his Novolin 70/30 until last night when he ran out. Denies hypoglycemia and does not have his blood sugar log with him. Denies numbness in extremities or visual symptoms.  His blood pressure is elevated and he endorses taking his antihypertensive as prescribed. He does not exercise much and is not compliant with a low-sodium or diabetic diet.  He was treated for bronchitis at his last visit and reports resolution of symptoms however he thinks the cough stemmed from the fact that he had mold in his home which has been taking care of and he is asymptomatic at this time. He does have left thoracic back pain which started with his coughing and pain persists. He is [facility] with the pharmacy [facility] Novolin 70/30.  Past Medical History:  Diagnosis Date  . Diabetes mellitus without complication (HCC)   . Heart failure of unknown type (HCC) 06/2011  . Hypertension   . Obesity   . Thrombocytopenia (HCC)     No past surgical history on file.  Allergies  Allergen Reactions  . Lisinopril     cough     Outpatient Medications Prior to Visit  Medication Sig Dispense Refill  . aspirin EC 81 MG tablet Take 81 mg by mouth daily.     . Insulin Syringe-Needle U-100 (BD INSULIN SYRINGE ULTRAFINE) 31G X 15/64" 0.5 ML MISC 1 each by Does not apply route 2 (two) times daily. 100 each 5  . carvedilol (COREG) 25 MG tablet TAKE 1 TABLET BY MOUTH 2 TIMES DAILY WITH A MEAL. 180 tablet 0  . glipiZIDE (GLUCOTROL) 10 MG tablet TAKE 1 TABLET  BY MOUTH 2 TIMES DAILY BEFORE A MEAL. 180 tablet 0  . insulin NPH-regular Human (NOVOLIN 70/30 RELION) (70-30) 100 UNIT/ML injection Inject 25 Units into the skin 2 (two) times daily with a meal. 90 mL 1  . losartan (COZAAR) 50 MG tablet Take 1 tablet (50 mg total) by mouth daily. 90 tablet 1  . spironolactone (ALDACTONE) 25 MG tablet Take 1 tablet (25 mg total) by mouth daily. 90 tablet 0  . EPINEPHrine (EPIPEN 2-PAK) 0.3 mg/0.3 mL IJ SOAJ injection Inject 0.3 mLs (0.3 mg total) into the muscle once. (Patient not taking: Reported on 10/09/2015) 1 Device 0  . silver sulfADIAZINE (SILVADENE) 1 % cream Apply 1 application topically daily. (Patient not taking: Reported on 05/13/2016) 50 g 0  . atorvastatin (LIPITOR) 20 MG tablet Take 1 tablet (20 mg total) by mouth daily. (Patient not taking: Reported on 05/27/2016) 90 tablet 1  . azithromycin (ZITHROMAX) 250 MG tablet Take 2 tabs (500 mg) orally on day 1 then 1 tablet (250 mg) on days 2-5 6 tablet 0  . promethazine-dextromethorphan (PROMETHAZINE-DM) 6.25-15 MG/5ML syrup Take 5 mLs by mouth 4 (four) times daily as needed for cough. 180 mL 0   No facility-administered medications prior to visit.     ROS Review of Systems  Constitutional: Negative for activity change and appetite change.  HENT: Negative for sinus pressure and sore throat.  Eyes: Negative for visual disturbance.  Respiratory: Negative for cough, chest tightness and shortness of breath.   Cardiovascular: Negative for chest pain and leg swelling.  Gastrointestinal: Negative for abdominal distention, abdominal pain, constipation and diarrhea.  Endocrine: Negative.   Genitourinary: Negative for dysuria.  Musculoskeletal: Positive for back pain. Negative for joint swelling and myalgias.  Skin: Negative for rash.  Allergic/Immunologic: Negative.   Neurological: Negative for weakness, light-headedness and numbness.  Psychiatric/Behavioral: Negative for dysphoric mood and suicidal ideas.      Objective:  BP (!) 176/93 (BP Location: Right Arm, Patient Position: Sitting, Cuff Size: Large)   Pulse 62   Temp 98.2 F (36.8 C) (Oral)   Ht 6' (1.829 m)   Wt (!) 318 lb 9.6 oz (144.5 kg)   SpO2 96%   BMI 43.21 kg/m   BP/Weight 05/27/2016 05/13/2016 10/09/2015  Systolic BP 176 166 160  Diastolic BP 93 69 90  Wt. (Lbs) 318.6 310.8 321.4  BMI 43.21 42.15 43.58      Physical Exam  Constitutional: He is oriented to person, place, and time. He appears well-developed and well-nourished.  Morbidly obese  Cardiovascular: Normal rate, normal heart sounds and intact distal pulses.   No murmur heard. Pulmonary/Chest: Effort normal and breath sounds normal. He has no wheezes. He has no rales. He exhibits no tenderness.  Abdominal: Soft. Bowel sounds are normal. He exhibits no distension and no mass. There is no tenderness.  Musculoskeletal: Normal range of motion.  Neurological: He is alert and oriented to person, place, and time.  Skin: Skin is warm and dry.  Psychiatric: He has a normal mood and affect.    \ Lab Results  Component Value Date   HGBA1C 10.0 05/27/2016   CMP Latest Ref Rng & Units 10/09/2015 04/26/2015 12/05/2014  Glucose 65 - 99 mg/dL 409(W) 119(J) 478(G)  BUN 7 - 25 mg/dL 12 12 16   Creatinine 0.60 - 1.35 mg/dL 9.56 2.13 0.86  Sodium 135 - 146 mmol/L 143 145 140  Potassium 3.5 - 5.3 mmol/L 3.7 3.9 3.7  Chloride 98 - 110 mmol/L 106 106 105  CO2 20 - 31 mmol/L 25 29 25   Calcium 8.6 - 10.3 mg/dL 8.9 9.1 9.0  Total Protein 6.1 - 8.1 g/dL 6.4 6.7 -  Total Bilirubin 0.2 - 1.2 mg/dL 0.7 0.5 -  Alkaline Phos 40 - 115 U/L 88 70 -  AST 10 - 40 U/L 30 33 -  ALT 9 - 46 U/L 24 38 -    Lipid Panel     Component Value Date/Time   CHOL 151 04/26/2015 1054   TRIG 230 (H) 04/26/2015 1054   HDL 22 (L) 04/26/2015 1054   CHOLHDL 6.9 (H) 04/26/2015 1054   VLDL 46 (H) 04/26/2015 1054   LDLCALC 83 04/26/2015 1054      Assessment & Plan:   1. Chronic systolic  congestive heart failure (HCC) EF 50-55% Euvolemic - spironolactone (ALDACTONE) 25 MG tablet; Take 1 tablet (25 mg total) by mouth daily.  Dispense: 90 tablet; Refill: 0  2. Essential hypertension Uncontrolled Increased dose of losartan. Low-sodium diet, lifestyle modifications and weight loss - losartan (COZAAR) 100 MG tablet; Take 1 tablet (100 mg total) by mouth daily.  Dispense: 90 tablet; Refill: 1 - carvedilol (COREG) 25 MG tablet; TAKE 1 TABLET BY MOUTH 2 TIMES DAILY WITH A MEAL.  Dispense: 180 tablet; Refill: 1  3. Pure hypercholesterolemia Elevated triglycerides Low cholesterol diet, weight loss - atorvastatin (LIPITOR) 20 MG tablet; Take  1 tablet (20 mg total) by mouth daily.  Dispense: 90 tablet; Refill: 1 - Lipid Panel w/reflex Direct LDL  4. Type 2 diabetes mellitus without complication, with long-term current use of insulin (HCC) Uncontrolled with A1c of 10.0 I have spoken with the pharmacy regarding his insulin and will be switching to Humulin 70/30 Increased dose to 35 units twice daily Lifestyle modification will go a long way to controlling his diabetes. - glipiZIDE (GLUCOTROL) 10 MG tablet; TAKE 1 TABLET BY MOUTH 2 TIMES DAILY BEFORE A MEAL.  Dispense: 180 tablet; Refill: 1 - Insulin Isophane & Regular Human (HUMULIN 70/30 KWIKPEN) (70-30) 100 UNIT/ML PEN; Inject 35 Units into the skin 2 (two) times daily.  Dispense: 10 pen; Refill: 1 - COMPLETE METABOLIC PANEL WITH GFR - Microalbumin / creatinine urine ratio  5. Acute right-sided thoracic back pain Likely of muscular etiology. - cyclobenzaprine (FLEXERIL) 10 MG tablet; Take 1 tablet (10 mg total) by mouth 2 (two) times daily as needed for muscle spasms.  Dispense: 60 tablet; Refill: 1   Meds ordered this encounter  Medications  . spironolactone (ALDACTONE) 25 MG tablet    Sig: Take 1 tablet (25 mg total) by mouth daily.    Dispense:  90 tablet    Refill:  0  . losartan (COZAAR) 100 MG tablet    Sig: Take 1  tablet (100 mg total) by mouth daily.    Dispense:  90 tablet    Refill:  1    90 day supply on all meds  . carvedilol (COREG) 25 MG tablet    Sig: TAKE 1 TABLET BY MOUTH 2 TIMES DAILY WITH A MEAL.    Dispense:  180 tablet    Refill:  1  . atorvastatin (LIPITOR) 20 MG tablet    Sig: Take 1 tablet (20 mg total) by mouth daily.    Dispense:  90 tablet    Refill:  1  . glipiZIDE (GLUCOTROL) 10 MG tablet    Sig: TAKE 1 TABLET BY MOUTH 2 TIMES DAILY BEFORE A MEAL.    Dispense:  180 tablet    Refill:  1  . Insulin Isophane & Regular Human (HUMULIN 70/30 KWIKPEN) (70-30) 100 UNIT/ML PEN    Sig: Inject 35 Units into the skin 2 (two) times daily.    Dispense:  10 pen    Refill:  1    Discontinue previous  . cyclobenzaprine (FLEXERIL) 10 MG tablet    Sig: Take 1 tablet (10 mg total) by mouth 2 (two) times daily as needed for muscle spasms.    Dispense:  60 tablet    Refill:  1    Follow-up: Return in about 3 months (around 08/24/2016) for Follow-up on diabetes mellitus.   Jaclyn Shaggy MD

## 2016-05-28 LAB — MICROALBUMIN / CREATININE URINE RATIO
CREATININE, URINE: 371 mg/dL — AB (ref 20–370)
MICROALB UR: 7.5 mg/dL
MICROALB/CREAT RATIO: 20 ug/mg{creat} (ref <30)

## 2016-05-29 ENCOUNTER — Telehealth: Payer: Self-pay

## 2016-05-29 NOTE — Telephone Encounter (Signed)
-----   Message from Jaclyn ShaggyEnobong Amao, MD sent at 05/28/2016 10:29 AM EST ----- Cholesterol is normal, triglycerides are slightly elevated and he will benefit from fish oil capsules. He has slightly low potassium; advised to increase intake of potassium rich foods.

## 2016-05-29 NOTE — Telephone Encounter (Signed)
Writer called patient and discussed lab results.  Patient stated understanding. 

## 2016-06-03 ENCOUNTER — Ambulatory Visit: Payer: Self-pay

## 2016-06-03 ENCOUNTER — Other Ambulatory Visit: Payer: Self-pay | Admitting: *Deleted

## 2016-06-03 NOTE — Telephone Encounter (Signed)
PASS PROGRAM APPLICATION

## 2016-06-10 ENCOUNTER — Other Ambulatory Visit: Payer: Self-pay | Admitting: Family Medicine

## 2016-06-10 ENCOUNTER — Ambulatory Visit: Payer: Self-pay

## 2016-06-10 DIAGNOSIS — I5022 Chronic systolic (congestive) heart failure: Secondary | ICD-10-CM

## 2016-06-10 DIAGNOSIS — M546 Pain in thoracic spine: Secondary | ICD-10-CM

## 2016-06-10 DIAGNOSIS — E119 Type 2 diabetes mellitus without complications: Secondary | ICD-10-CM

## 2016-06-10 DIAGNOSIS — I10 Essential (primary) hypertension: Secondary | ICD-10-CM

## 2016-06-10 DIAGNOSIS — E7800 Pure hypercholesterolemia, unspecified: Secondary | ICD-10-CM

## 2016-06-10 DIAGNOSIS — Z794 Long term (current) use of insulin: Principal | ICD-10-CM

## 2016-06-10 MED ORDER — INSULIN ISOPHANE & REGULAR (HUMAN 70-30)100 UNIT/ML KWIKPEN: 35.0000 [IU] | PEN_INJECTOR | Freq: Two times a day (BID) | SUBCUTANEOUS | 3 refills | Status: DC

## 2016-06-25 ENCOUNTER — Ambulatory Visit (HOSPITAL_BASED_OUTPATIENT_CLINIC_OR_DEPARTMENT_OTHER): Payer: Self-pay | Admitting: Pharmacist

## 2016-06-25 DIAGNOSIS — I5022 Chronic systolic (congestive) heart failure: Secondary | ICD-10-CM

## 2016-06-25 DIAGNOSIS — Z794 Long term (current) use of insulin: Secondary | ICD-10-CM

## 2016-06-25 DIAGNOSIS — E7800 Pure hypercholesterolemia, unspecified: Secondary | ICD-10-CM

## 2016-06-25 DIAGNOSIS — E119 Type 2 diabetes mellitus without complications: Secondary | ICD-10-CM

## 2016-06-25 DIAGNOSIS — I10 Essential (primary) hypertension: Secondary | ICD-10-CM

## 2016-06-25 DIAGNOSIS — Z79899 Other long term (current) drug therapy: Secondary | ICD-10-CM

## 2016-06-25 DIAGNOSIS — M546 Pain in thoracic spine: Secondary | ICD-10-CM

## 2016-06-25 MED ORDER — INSULIN ISOPHANE & REGULAR (HUMAN 70-30)100 UNIT/ML KWIKPEN: 35.0000 [IU] | PEN_INJECTOR | Freq: Two times a day (BID) | SUBCUTANEOUS | 3 refills | Status: DC

## 2016-06-25 MED FILL — CARVEDILOL 25 MG TABLET: 25 | 90 days supply | Qty: 180 | Fill #0

## 2016-06-25 MED FILL — LOSARTAN POTASSIUM 100 MG T: 100 | 90 days supply | Qty: 90 | Fill #0

## 2016-06-25 MED FILL — glipiZIDE 10 MG TABS: 10 | 90 days supply | Qty: 180 | Fill #0

## 2016-06-25 MED FILL — SPIRONOLACTONE 25 MG TABLET: 25 | 60 days supply | Qty: 60 | Fill #1

## 2016-06-25 MED FILL — ?CYCLOBENZAPRINE 10 MG TABL: 10 | 30 days supply | Qty: 60 | Fill #1

## 2016-06-25 MED FILL — ATORVASTATIN 20 MG TABLET: 20 | 90 days supply | Qty: 90 | Fill #0

## 2016-06-25 NOTE — Patient Instructions (Signed)
Thanks for coming to see Dave Arnold!   Insulin Injection Instructions, Using Insulin Pens, Adult A subcutaneous injection is a shot of medicine that is injected into the layer of fat between skin and muscle. People with type 1 diabetes must take insulin because their bodies do not make it. People with type 2 diabetes may need to take insulin. There are many different types of insulin. The type of insulin that you take may determine how many injections you give yourself and when you need to take the injections. Choosing a site for injection Insulin absorption varies from site to site. It is best to inject insulin within the same body area, using a different spot in that area for each injection. Do not inject the insulin in the same spot for each injection. There are five main areas that can be used for injecting. These areas include:  Abdomen. This is the preferred area.  Front of thigh.  Upper, outer side of thigh.  Back of upper arm.  Buttocks. Using an insulin pen First, follow the steps for Getting Ready, then continue with the steps for Injecting the Insulin. Getting Ready  1. Wash your hands with soap and water. If soap and water are not available, use hand sanitizer. 2. Check the expiration date and type of insulin in the pen. 3. If you are using CLEAR insulin, check to see that it is clear and free of clumps. 4. If you are using CLOUDY insulin, gently roll the pen between your palms several times, or tip the pen up and down several times to mix up the medicine. Do not shake the pen. 5. Remove the cap from the insulin pen. 6. Use an alcohol wipe to clean the rubber stopper of the pen cartridge. 7. Remove the protective paper tab from the disposable needle. Do not let the needle touch anything. 8. Screw the needle onto the pen. 9. Remove the outer and inner plastic covers from the needle. Do not throw away the outer plastic cover yet. 10. Prime the insulin pen by turning the button (dial)  to 2 units. Hold the pen with the needle pointing up, and push the button on the opposite end of the pen until a drop of insulin appears at the needle tip. If no insulin appears, repeat this step. 11. Dial the number of units of insulin that you will be injecting. Injecting the Insulin   1. Use an alcohol wipe to clean the site where you will be injecting the needle. Let the site air-dry. 2. Hold the pen in the palm of your writing hand with your thumb on the top. 3. If directed by your health care provider, use your other hand to pinch and hold about an inch of skin at the injection site. Do not directly touch the cleaned part of the skin. 4. Gently but quickly, put the needle straight into the skin. The needle should be at a 90-degree angle (perpendicular) to the skin, as if to form the letter "L."  For example, if you are giving an injection in the abdomen, the abdomen forms one "leg" of the "L" and the needle forms the other "leg" of the "L." 5. For adults who have a small amount of body fat, the needle may need to be injected at a 45-degree angle instead. Your health care provider will tell you if this is necessary.  A 45-degree angle looks like the letter "V." 6. When the needle is completely inserted into the skin, use the  thumb of your writing hand to push the top button of the pen down all the way to inject the insulin. 7. Let go of the skin that you are pinching. Continue to hold the pen in place with your writing hand. 8. Wait five seconds, then pull the needle straight out of the skin. 9. Carefully put the larger (outer) plastic cover of the needle back over the needle, then unscrew the capped needle and discard it in a sharps container, such as an empty plastic bottle with a cover. 10. Put the plastic cap back on the insulin pen. Throwing away supplies  Discard all used needles in a puncture-proof sharps disposal container. You can ask your local pharmacy about where you can get this  kind of disposal container, or you can use an empty liquid laundry detergent bottle that has a cover.  Follow the disposal regulations for the area where you live. Do not use any needle more than one time.  Throw away empty disposable pens in the regular trash. What questions should I ask my health care provider?  How often should I be taking insulin?  How often should I check my blood glucose?  What amount of insulin should I be taking at each time?  What are the side effects?  What should I do if my blood glucose is too high?  What should I do if my blood glucose is too low?  What should I do if I forget to take my insulin?  What number should I call if I have questions? Where can I get more information?  American Diabetes Association (ADA): www.diabetes.org  American Association of Diabetes Educators (AADE) Patient Resources: https://www.diabeteseducator.org/patient-resources This information is not intended to replace advice given to you by your health care provider. Make sure you discuss any questions you have with your health care provider. Document Released: 04/20/2015 Document Revised: 08/23/2015 Document Reviewed: 04/20/2015 Elsevier Interactive Patient Education  2017 [employer].

## 2016-06-25 NOTE — Progress Notes (Signed)
      Patient provided education on how to use insulin pen. Patient already aware of how to use the pen, he just doesn't like it as much as the other insulin he used but he was switched due to formulary issues. Pharmacy contacted and was told to dispense 30 day supply since patient is running out before 30 days. Patient to follow up with Milderd Meagerriana in the pharmacy for the pass program. Patient verbalizes understanding.    Physical Exam   ROS

## 2016-06-30 MED FILL — !HUMULIN 70/30 KWIKPEN: (70-30) 100 | 64 days supply | Qty: 45 | Fill #0

## 2016-07-19 ENCOUNTER — Emergency Department (HOSPITAL_COMMUNITY)
Admission: EM | Admit: 2016-07-19 | Discharge: 2016-07-19 | Disposition: A | Discharge status: Home / Self Care | Payer: Self-pay | Attending: Emergency Medicine | Admitting: Emergency Medicine

## 2016-07-19 ENCOUNTER — Encounter (HOSPITAL_COMMUNITY): Payer: Self-pay

## 2016-07-19 DIAGNOSIS — E1165 Type 2 diabetes mellitus with hyperglycemia: Secondary | ICD-10-CM | POA: Insufficient documentation

## 2016-07-19 DIAGNOSIS — Z7982 Long term (current) use of aspirin: Secondary | ICD-10-CM | POA: Insufficient documentation

## 2016-07-19 DIAGNOSIS — K029 Dental caries, unspecified: Secondary | ICD-10-CM | POA: Insufficient documentation

## 2016-07-19 DIAGNOSIS — Z79899 Other long term (current) drug therapy: Secondary | ICD-10-CM | POA: Insufficient documentation

## 2016-07-19 DIAGNOSIS — I110 Hypertensive heart disease with heart failure: Secondary | ICD-10-CM | POA: Insufficient documentation

## 2016-07-19 DIAGNOSIS — I5020 Unspecified systolic (congestive) heart failure: Secondary | ICD-10-CM | POA: Insufficient documentation

## 2016-07-19 DIAGNOSIS — Z794 Long term (current) use of insulin: Secondary | ICD-10-CM | POA: Insufficient documentation

## 2016-07-19 DIAGNOSIS — R739 Hyperglycemia, unspecified: Secondary | ICD-10-CM

## 2016-07-19 LAB — CBG MONITORING, ED: Glucose-Capillary: 314 mg/dL — ABNORMAL HIGH (ref 65–99)

## 2016-07-19 MED ORDER — PENICILLIN V POTASSIUM 500 MG PO TABS: 500.0000 mg | ORAL_TABLET | Freq: Four times a day (QID) | ORAL | 0 refills | Status: AC

## 2016-07-19 MED ORDER — IBUPROFEN 600 MG PO TABS: 600.0000 mg | ORAL_TABLET | Freq: Four times a day (QID) | ORAL | 0 refills | Status: AC | PRN

## 2016-07-19 MED ORDER — IBUPROFEN 800 MG PO TABS
800.0000 mg | ORAL_TABLET | Freq: Once | ORAL | Status: AC
  Administered 2016-07-19: 800 mg via ORAL
  Filled 2016-07-19: qty 1

## 2016-07-19 MED ORDER — PENICILLIN V POTASSIUM 500 MG PO TABS
500.0000 mg | ORAL_TABLET | Freq: Once | ORAL | Status: AC
  Administered 2016-07-19: 500 mg via ORAL
  Filled 2016-07-19: qty 1

## 2016-07-19 NOTE — Discharge Instructions (Signed)
Check your blood sugar closely over the next 24-48 hours. See your doctor if you are unable to control high levels.

## 2016-07-19 NOTE — ED Triage Notes (Signed)
Pt c/o abscessed tooth on L lower side of his mouth x 2 days. Pt states that that his throat feels dry making it uncomfortable to swallow.  A&Ox4. Ambulatory.

## 2016-07-19 NOTE — ED Provider Notes (Signed)
[facility]-EMERGENCY DEPT Provider Note   CSN: [id number] Arrival date & time: 07/19/16  0013     History   Chief Complaint Chief Complaint  Patient presents with  . Dental Pain    HPI Dave Arnold is a 46 y.o. male.  Recurrent toothache in lower left molar x 2 days. No facial swelling or fever. He reports history of abscess in this tooth that feels the same. No difficulty swallowing.    The history is provided by the patient. No language interpreter was used.  Dental Pain      Past Medical History:  Diagnosis Date  . Diabetes mellitus without complication (HCC)   . Heart failure of unknown type (HCC) 06/2011  . Hypertension   . Obesity   . Thrombocytopenia [facility])     Patient Active Problem List   Diagnosis Date Noted  . Hyperlipidemia 05/25/2015  . Systolic CHF (HCC) 12/26/2014  . Chronic low back pain 12/26/2014  . DM (diabetes mellitus) (HCC) 08/15/2013  . Obesity   . Hypertension   . Thrombocytopenia (HCC)     History reviewed. No pertinent surgical history.     Home Medications    Prior to Admission medications   Medication Sig Start Date End Date Taking? Authorizing Provider  aspirin EC 81 MG tablet Take 81 mg by mouth daily.     Historical Provider, MD  atorvastatin (LIPITOR) 20 MG tablet Take 1 tablet (20 mg total) by mouth daily. 05/27/16   Jaclyn Shaggy, MD  carvedilol (COREG) 25 MG tablet TAKE 1 TABLET BY MOUTH 2 TIMES DAILY WITH A MEAL. 05/27/16   Jaclyn Shaggy, MD  cyclobenzaprine (FLEXERIL) 10 MG tablet Take 1 tablet (10 mg total) by mouth 2 (two) times daily as needed for muscle spasms. 05/27/16   Jaclyn Shaggy, MD  EPINEPHrine (EPIPEN 2-PAK) 0.3 mg/0.3 mL IJ SOAJ injection Inject 0.3 mLs (0.3 mg total) into the muscle once. Patient not taking: Reported on 10/09/2015 12/05/14   Melene Plan, DO  glipiZIDE (GLUCOTROL) 10 MG tablet TAKE 1 TABLET BY MOUTH 2 TIMES DAILY BEFORE A MEAL. 05/27/16   Jaclyn Shaggy, MD  Insulin Isophane & Regular Human (HUMULIN  70/30 KWIKPEN) (70-30) 100 UNIT/ML PEN Inject 35 Units into the skin 2 (two) times daily. 06/25/16   Jaclyn Shaggy, MD  Insulin Pen Needle 31G X 5 MM MISC 1 each by Does not apply route 2 (two) times daily. 05/27/16   Jaclyn Shaggy, MD  Insulin Syringe-Needle U-100 (BD INSULIN SYRINGE ULTRAFINE) 31G X 15/64" 0.5 ML MISC 1 each by Does not apply route 2 (two) times daily. 05/25/15   Jaclyn Shaggy, MD  losartan (COZAAR) 100 MG tablet Take 1 tablet (100 mg total) by mouth daily. 05/27/16   Jaclyn Shaggy, MD  silver sulfADIAZINE (SILVADENE) 1 % cream Apply 1 application topically daily. Patient not taking: Reported on 05/13/2016 10/09/15   Jaclyn Shaggy, MD  spironolactone (ALDACTONE) 25 MG tablet Take 1 tablet (25 mg total) by mouth daily. 05/27/16   Jaclyn Shaggy, MD    Family History Family History  Problem Relation Age of Onset  . Heart disease Mother     Social History Social History  Substance Use Topics  . Smoking status: Never Smoker  . Smokeless tobacco: Never Used  . Alcohol use No     Allergies   Lisinopril   Review of Systems Review of Systems  Constitutional: Negative for fever.  HENT: Positive for dental problem. Negative for facial swelling and trouble swallowing.  Gastrointestinal: Negative for nausea.  Musculoskeletal: Negative for myalgias.     Physical Exam Updated Vital Signs BP 139/86 (BP Location: Left Arm)   Pulse 79   Temp 98.8 F (37.1 C) (Oral)   Resp 18   Wt 132 kg   SpO2 99%   BMI 39.47 kg/m   Physical Exam  Constitutional: He is oriented to person, place, and time. He appears well-developed and well-nourished.  HENT:  Significant dental erosion of #19. No visualized abscess. No facial swelling however exam limited by body habitus. No palpable induration of left jaw line.   Neck: Normal range of motion.  Pulmonary/Chest: Effort normal.  Musculoskeletal: Normal range of motion.  Neurological: He is alert and oriented to person, place, and time.    Skin: Skin is warm and dry.  Psychiatric: He has a normal mood and affect.     ED Treatments / Results  Labs (all labs ordered are listed, but only abnormal results are displayed) Labs Reviewed - No data to display  EKG  EKG Interpretation None       Radiology No results found.  Procedures Procedures (including critical care time)  Medications Ordered in ED Medications  penicillin v potassium (VEETID) tablet 500 mg (not administered)  ibuprofen (ADVIL,MOTRIN) tablet 800 mg (not administered)     Initial Impression / Assessment and Plan / ED Course  I have reviewed the triage vital signs and the nursing notes.  Pertinent labs & imaging results that were available during my care of the patient were reviewed by me and considered in my medical decision making (see chart for details).     Dental caries with dental pain. Will start on penicillin and ibuprofen. Will provide dental resources.   Final Clinical Impressions(s) / ED Diagnoses   Final diagnoses:  None   1. Dental caries  New Prescriptions New Prescriptions   No medications on file     Dave Arnold 07/19/16 0308    Dave Palumbo, MD 07/19/16 [phone]

## 2016-09-04 ENCOUNTER — Other Ambulatory Visit: Payer: Self-pay | Admitting: Family Medicine

## 2016-09-04 DIAGNOSIS — E119 Type 2 diabetes mellitus without complications: Secondary | ICD-10-CM

## 2016-09-04 DIAGNOSIS — I5022 Chronic systolic (congestive) heart failure: Secondary | ICD-10-CM

## 2016-09-04 DIAGNOSIS — M546 Pain in thoracic spine: Secondary | ICD-10-CM

## 2016-09-04 DIAGNOSIS — Z794 Long term (current) use of insulin: Secondary | ICD-10-CM

## 2016-09-04 DIAGNOSIS — E7800 Pure hypercholesterolemia, unspecified: Secondary | ICD-10-CM

## 2016-09-04 DIAGNOSIS — I10 Essential (primary) hypertension: Secondary | ICD-10-CM

## 2016-09-04 MED FILL — CARVEDILOL 25 MG TABLET: 25 | 90 days supply | Qty: 180 | Fill #1

## 2016-09-04 MED FILL — ATORVASTATIN 20 MG TABLET: 20 | 90 days supply | Qty: 90 | Fill #1

## 2016-09-04 MED FILL — $HUMULIN 70/30 KWIKPEN: (70-30) 100 | 64 days supply | Qty: 45 | Fill #1

## 2016-09-04 MED FILL — ?GLIPIZIDE 10 MG TABLET: 10 | 90 days supply | Qty: 180 | Fill #1

## 2016-09-04 MED FILL — LOSARTAN POTASSIUM 100 MG T: 100 | 90 days supply | Qty: 90 | Fill #1

## 2016-09-04 MED FILL — ?SPIRONOLACTONE 25 MG TABLE: 25 | 30 days supply | Qty: 30 | Fill #0

## 2016-10-21 ENCOUNTER — Other Ambulatory Visit: Payer: Self-pay | Admitting: Pharmacist

## 2016-10-21 DIAGNOSIS — M546 Pain in thoracic spine: Secondary | ICD-10-CM

## 2016-10-21 DIAGNOSIS — Z794 Long term (current) use of insulin: Secondary | ICD-10-CM

## 2016-10-21 DIAGNOSIS — E7800 Pure hypercholesterolemia, unspecified: Secondary | ICD-10-CM

## 2016-10-21 DIAGNOSIS — I5022 Chronic systolic (congestive) heart failure: Secondary | ICD-10-CM

## 2016-10-21 DIAGNOSIS — E119 Type 2 diabetes mellitus without complications: Secondary | ICD-10-CM

## 2016-10-21 DIAGNOSIS — I10 Essential (primary) hypertension: Secondary | ICD-10-CM

## 2016-10-21 MED ORDER — SPIRONOLACTONE 25 MG PO TABS: 25.0000 mg | ORAL_TABLET | Freq: Every day | ORAL | 0 refills | Status: DC

## 2016-10-21 MED FILL — ?SPIRONOLACTONE 25 MG TABLE: 25 | 30 days supply | Qty: 30 | Fill #1

## 2016-10-21 MED FILL — $HUMULIN 70/30 KWIKPEN: (70-30) 100 | 63 days supply | Qty: 45 | Fill #2

## 2016-12-02 ENCOUNTER — Other Ambulatory Visit: Payer: Self-pay | Admitting: Family Medicine

## 2016-12-02 DIAGNOSIS — I5022 Chronic systolic (congestive) heart failure: Secondary | ICD-10-CM

## 2016-12-02 DIAGNOSIS — Z794 Long term (current) use of insulin: Secondary | ICD-10-CM

## 2016-12-02 DIAGNOSIS — M546 Pain in thoracic spine: Secondary | ICD-10-CM

## 2016-12-02 DIAGNOSIS — E119 Type 2 diabetes mellitus without complications: Secondary | ICD-10-CM

## 2016-12-02 DIAGNOSIS — E7800 Pure hypercholesterolemia, unspecified: Secondary | ICD-10-CM

## 2016-12-02 DIAGNOSIS — I10 Essential (primary) hypertension: Secondary | ICD-10-CM

## 2016-12-02 MED FILL — ?SPIRONOLACTONE 25 MG TABLE: 25 | 30 days supply | Qty: 30 | Fill #2

## 2016-12-02 MED FILL — ?ATORVASTATIN 20 MG TABLET: 20 | 30 days supply | Qty: 30 | Fill #0

## 2016-12-08 ENCOUNTER — Other Ambulatory Visit: Payer: Self-pay | Admitting: Family Medicine

## 2016-12-08 DIAGNOSIS — E7800 Pure hypercholesterolemia, unspecified: Secondary | ICD-10-CM

## 2016-12-08 DIAGNOSIS — Z794 Long term (current) use of insulin: Secondary | ICD-10-CM

## 2016-12-08 DIAGNOSIS — M546 Pain in thoracic spine: Secondary | ICD-10-CM

## 2016-12-08 DIAGNOSIS — E119 Type 2 diabetes mellitus without complications: Secondary | ICD-10-CM

## 2016-12-08 DIAGNOSIS — I10 Essential (primary) hypertension: Secondary | ICD-10-CM

## 2016-12-08 DIAGNOSIS — I5022 Chronic systolic (congestive) heart failure: Secondary | ICD-10-CM

## 2016-12-09 MED FILL — ?CYCLOBENZAPRINE 10 MG TABL: 10 | 30 days supply | Qty: 60 | Fill #0

## 2017-01-01 ENCOUNTER — Other Ambulatory Visit: Payer: Self-pay | Admitting: Family Medicine

## 2017-01-01 DIAGNOSIS — I5022 Chronic systolic (congestive) heart failure: Secondary | ICD-10-CM

## 2017-01-01 DIAGNOSIS — E7800 Pure hypercholesterolemia, unspecified: Secondary | ICD-10-CM

## 2017-01-01 DIAGNOSIS — Z794 Long term (current) use of insulin: Secondary | ICD-10-CM

## 2017-01-01 DIAGNOSIS — E119 Type 2 diabetes mellitus without complications: Secondary | ICD-10-CM

## 2017-01-01 DIAGNOSIS — M546 Pain in thoracic spine: Secondary | ICD-10-CM

## 2017-01-01 DIAGNOSIS — I10 Essential (primary) hypertension: Secondary | ICD-10-CM

## 2017-01-01 MED FILL — $HUMULIN 70/30 KWIKPEN: (70-30) 100 | 33 days supply | Qty: 24 | Fill #3

## 2017-01-01 MED FILL — ?CARVEDILOL 25 MG TABLET: 25 | 30 days supply | Qty: 60 | Fill #1

## 2017-01-01 MED FILL — ?ATORVASTATIN 20 MG TABLET: 20 | 30 days supply | Qty: 30 | Fill #1

## 2017-01-01 MED FILL — glipiZIDE 10 MG TABS: 10 | 30 days supply | Qty: 60 | Fill #1

## 2017-01-07 ENCOUNTER — Other Ambulatory Visit: Payer: Self-pay | Admitting: Family Medicine

## 2017-01-07 DIAGNOSIS — I5022 Chronic systolic (congestive) heart failure: Secondary | ICD-10-CM

## 2017-01-07 DIAGNOSIS — M546 Pain in thoracic spine: Secondary | ICD-10-CM

## 2017-01-07 DIAGNOSIS — Z794 Long term (current) use of insulin: Secondary | ICD-10-CM

## 2017-01-07 DIAGNOSIS — I10 Essential (primary) hypertension: Secondary | ICD-10-CM

## 2017-01-07 DIAGNOSIS — E7800 Pure hypercholesterolemia, unspecified: Secondary | ICD-10-CM

## 2017-01-07 DIAGNOSIS — E119 Type 2 diabetes mellitus without complications: Secondary | ICD-10-CM

## 2017-02-11 ENCOUNTER — Other Ambulatory Visit: Payer: Self-pay | Admitting: Family Medicine

## 2017-02-11 DIAGNOSIS — M546 Pain in thoracic spine: Secondary | ICD-10-CM

## 2017-02-11 DIAGNOSIS — E119 Type 2 diabetes mellitus without complications: Secondary | ICD-10-CM

## 2017-02-11 DIAGNOSIS — Z794 Long term (current) use of insulin: Secondary | ICD-10-CM

## 2017-02-11 DIAGNOSIS — I5022 Chronic systolic (congestive) heart failure: Secondary | ICD-10-CM

## 2017-02-11 DIAGNOSIS — E7800 Pure hypercholesterolemia, unspecified: Secondary | ICD-10-CM

## 2017-02-11 DIAGNOSIS — I10 Essential (primary) hypertension: Secondary | ICD-10-CM

## 2017-02-11 MED FILL — ?CYCLOBENZAPRINE 10 MG TABL: 10 | 30 days supply | Qty: 60 | Fill #1

## 2017-02-11 MED FILL — ATORVASTATIN 20 MG TABLET: 20 | 30 days supply | Qty: 30 | Fill #2

## 2017-02-11 MED FILL — ?CARVEDILOL 25 MG TABLET: 25 | 30 days supply | Qty: 60 | Fill #2

## 2017-02-11 MED FILL — ?GLIPIZIDE 10 MG TABLET: 10 | 30 days supply | Qty: 60 | Fill #2

## 2017-02-13 MED FILL — $HUMULIN 70/30 KWIKPEN: (70-30) 100 | 33 days supply | Qty: 24 | Fill #4

## 2017-02-13 MED FILL — ?SPIRONOLACTONE 25 MG TABLE: 25 | 30 days supply | Qty: 30 | Fill #0

## 2017-02-23 ENCOUNTER — Other Ambulatory Visit: Payer: Self-pay | Admitting: Family Medicine

## 2017-02-23 DIAGNOSIS — Z794 Long term (current) use of insulin: Secondary | ICD-10-CM

## 2017-02-23 DIAGNOSIS — E119 Type 2 diabetes mellitus without complications: Secondary | ICD-10-CM

## 2017-02-23 DIAGNOSIS — I10 Essential (primary) hypertension: Secondary | ICD-10-CM

## 2017-02-23 DIAGNOSIS — M546 Pain in thoracic spine: Secondary | ICD-10-CM

## 2017-02-23 DIAGNOSIS — I5022 Chronic systolic (congestive) heart failure: Secondary | ICD-10-CM

## 2017-02-23 DIAGNOSIS — E7800 Pure hypercholesterolemia, unspecified: Secondary | ICD-10-CM

## 2017-02-23 MED FILL — TRUEPLUS PEN NDL 31GX3/16": 31G X 5 MM | 50 days supply | Qty: 100 | Fill #1

## 2017-02-23 MED FILL — TRUEPLUS PEN NDL 31GX3/16: 31G X 5 MM | 50 days supply | Qty: 100 | Fill #1

## 2017-03-03 ENCOUNTER — Other Ambulatory Visit: Payer: Self-pay | Admitting: Family Medicine

## 2017-03-03 DIAGNOSIS — M546 Pain in thoracic spine: Secondary | ICD-10-CM

## 2017-03-03 DIAGNOSIS — E7800 Pure hypercholesterolemia, unspecified: Secondary | ICD-10-CM

## 2017-03-03 DIAGNOSIS — E119 Type 2 diabetes mellitus without complications: Secondary | ICD-10-CM

## 2017-03-03 DIAGNOSIS — I5022 Chronic systolic (congestive) heart failure: Secondary | ICD-10-CM

## 2017-03-03 DIAGNOSIS — Z794 Long term (current) use of insulin: Secondary | ICD-10-CM

## 2017-03-03 DIAGNOSIS — I10 Essential (primary) hypertension: Secondary | ICD-10-CM

## 2017-03-12 ENCOUNTER — Telehealth: Payer: Self-pay | Admitting: Family Medicine

## 2017-03-12 ENCOUNTER — Other Ambulatory Visit: Payer: Self-pay | Admitting: Family Medicine

## 2017-03-12 DIAGNOSIS — M546 Pain in thoracic spine: Secondary | ICD-10-CM

## 2017-03-12 DIAGNOSIS — E119 Type 2 diabetes mellitus without complications: Secondary | ICD-10-CM

## 2017-03-12 DIAGNOSIS — I10 Essential (primary) hypertension: Secondary | ICD-10-CM

## 2017-03-12 DIAGNOSIS — E7800 Pure hypercholesterolemia, unspecified: Secondary | ICD-10-CM

## 2017-03-12 DIAGNOSIS — I5022 Chronic systolic (congestive) heart failure: Secondary | ICD-10-CM

## 2017-03-12 DIAGNOSIS — Z794 Long term (current) use of insulin: Secondary | ICD-10-CM

## 2017-03-12 MED FILL — ?SPIRONOLACTONE 25 MG TABLE: 25 | 30 days supply | Qty: 30 | Fill #1

## 2017-03-12 NOTE — Telephone Encounter (Signed)
Last seen by PCP in February and no appt scheduled. Will forward to PCP

## 2017-03-12 NOTE — Telephone Encounter (Signed)
Pt came to the office need refill for losartan (COZAAR) 100 MG tablet carvedilol (COREG) 25 MG tablet spironolactone (ALDACTONE) 25 MG tablet  [facility] Please follow up

## 2017-03-13 NOTE — Telephone Encounter (Signed)
He needs an OV  

## 2017-03-13 NOTE — Telephone Encounter (Signed)
PT was called LVM informed him of the med refill was denied

## 2017-03-16 ENCOUNTER — Ambulatory Visit: Payer: Self-pay | Attending: Family Medicine | Admitting: Physician Assistant

## 2017-03-16 ENCOUNTER — Other Ambulatory Visit: Payer: Self-pay

## 2017-03-16 VITALS — BP 148/108 | HR 102 | Temp 98.8°F | Resp 20 | Ht 72.0 in | Wt 291.0 lb

## 2017-03-16 DIAGNOSIS — E7800 Pure hypercholesterolemia, unspecified: Secondary | ICD-10-CM | POA: Insufficient documentation

## 2017-03-16 DIAGNOSIS — I110 Hypertensive heart disease with heart failure: Secondary | ICD-10-CM | POA: Insufficient documentation

## 2017-03-16 DIAGNOSIS — Z794 Long term (current) use of insulin: Secondary | ICD-10-CM | POA: Insufficient documentation

## 2017-03-16 DIAGNOSIS — I10 Essential (primary) hypertension: Secondary | ICD-10-CM

## 2017-03-16 DIAGNOSIS — E1165 Type 2 diabetes mellitus with hyperglycemia: Secondary | ICD-10-CM | POA: Insufficient documentation

## 2017-03-16 DIAGNOSIS — E119 Type 2 diabetes mellitus without complications: Secondary | ICD-10-CM

## 2017-03-16 DIAGNOSIS — I5022 Chronic systolic (congestive) heart failure: Secondary | ICD-10-CM | POA: Insufficient documentation

## 2017-03-16 DIAGNOSIS — E0865 Diabetes mellitus due to underlying condition with hyperglycemia: Secondary | ICD-10-CM

## 2017-03-16 LAB — POCT URINALYSIS DIPSTICK
Bilirubin, UA: NEGATIVE
Glucose, UA: 500
Ketones, UA: NEGATIVE
LEUKOCYTES UA: NEGATIVE
NITRITE UA: NEGATIVE
PH UA: 5.5 (ref 5.0–8.0)
RBC UA: NEGATIVE
UROBILINOGEN UA: 0.2 U/dL

## 2017-03-16 LAB — POCT GLYCOSYLATED HEMOGLOBIN (HGB A1C): HEMOGLOBIN A1C: 11.8

## 2017-03-16 LAB — GLUCOSE, POCT (MANUAL RESULT ENTRY)
POC GLUCOSE: 467 mg/dL — AB (ref 70–99)
POC Glucose: 507 mg/dl — AB (ref 70–99)

## 2017-03-16 MED ORDER — "INSULIN SYRINGE-NEEDLE U-100 31G X 15/64"" 0.5 ML MISC": 1.0000 | Freq: Two times a day (BID) | 5 refills | Status: AC

## 2017-03-16 MED ORDER — INSULIN ASPART 100 UNIT/ML ~~LOC~~ SOLN
30.0000 [IU] | Freq: Once | SUBCUTANEOUS | Status: AC
  Administered 2017-03-16: 30 [IU] via SUBCUTANEOUS

## 2017-03-16 MED ORDER — INSULIN PEN NEEDLE 31G X 5 MM MISC: 1.0000 | Freq: Two times a day (BID) | 1 refills | Status: DC

## 2017-03-16 MED ORDER — LOSARTAN POTASSIUM 100 MG PO TABS: 100.0000 mg | ORAL_TABLET | Freq: Every day | ORAL | 1 refills | Status: DC

## 2017-03-16 MED ORDER — CARVEDILOL 25 MG PO TABS: ORAL_TABLET | ORAL | 1 refills | Status: DC

## 2017-03-16 MED ORDER — INSULIN ISOPHANE & REGULAR (HUMAN 70-30)100 UNIT/ML KWIKPEN: 35.0000 [IU] | PEN_INJECTOR | Freq: Two times a day (BID) | SUBCUTANEOUS | 3 refills | Status: DC

## 2017-03-16 MED ORDER — SPIRONOLACTONE 25 MG PO TABS: 25.0000 mg | ORAL_TABLET | Freq: Every day | ORAL | 0 refills | Status: DC

## 2017-03-16 MED ORDER — ATORVASTATIN CALCIUM 20 MG PO TABS: 20.0000 mg | ORAL_TABLET | Freq: Every day | ORAL | 1 refills | Status: DC

## 2017-03-16 MED ORDER — GLIPIZIDE 10 MG PO TABS: ORAL_TABLET | ORAL | 1 refills | Status: DC

## 2017-03-16 MED FILL — ?CARVEDILOL 25 MG TABLET: 25 | 30 days supply | Qty: 60 | Fill #0

## 2017-03-16 MED FILL — ?ATORVASTATIN 20 MG TABLET: 20 | 30 days supply | Qty: 30 | Fill #0

## 2017-03-16 MED FILL — LOSARTAN POTASSIUM 100 MG T: 100 | 30 days supply | Qty: 30 | Fill #0

## 2017-03-16 MED FILL — $HUMULIN 70/30 KWIKPEN: (70-30) 100 | 30 days supply | Qty: 21 | Fill #0

## 2017-03-16 MED FILL — TRUEPLUS PEN NDL 31GX3/16: 31G X 5 MM | 50 days supply | Qty: 100 | Fill #0

## 2017-03-16 MED FILL — TRUEPLUS PEN NDL 31GX3/16": 31G X 5 MM | 50 days supply | Qty: 100 | Fill #0

## 2017-03-16 MED FILL — ?GLIPIZIDE 10 MG TABLET: 10 | 30 days supply | Qty: 60 | Fill #0

## 2017-03-16 NOTE — Progress Notes (Signed)
Needs Med RF Wishes to get Rx for 90 days Out of meds x 3 weeks

## 2017-03-16 NOTE — Progress Notes (Signed)
Patient ID: Dave Arnold, male   DOB: Oct 31, 1970, 46 y.o.   MRN: [id number]   Dave Arnold, is a 46 y.o. male  [id number]:[id number]  [id number]  DOB - March 01, 1971  Subjective:  Chief Complaint and HPI: Dave Arnold is a 46 y.o. male here today  For DM check.  He is very upset that he keeps running out of meds, but he has not been following proper f/up appts.  He was last seen in our office by Viann Fish in March and least saw his PCP in February 2018.  I have encouraged him that all diabetic patients should be seen at least every 3 months.  He has been out of all of his medications for about 3 weeks.  He has not been checking his blood sugars.  He has been very thirsty and drinking a lot of sodas.  He does not follow a diabetic or DASH diet.  He wants 3 mont RF of all meds.     ROS:   Constitutional:  No f/c, No night sweats, No unexplained weight loss. EENT:  No vision changes, No blurry vision, No hearing changes. No mouth, throat, or ear problems.  Respiratory: No cough, No SOB Cardiac: No CP, no palpitations GI:  No abd pain, No N/V/D. GU: No Urinary s/sx Musculoskeletal: No joint pain Neuro: No headache, no dizziness, no motor weakness.  Skin: No rash Endocrine:  + polydipsia. No polyuria.  Psych: Denies SI/HI  No problems updated.  ALLERGIES: Allergies  Allergen Reactions  . Lisinopril     cough    PAST MEDICAL HISTORY: Past Medical History:  Diagnosis Date  . Diabetes mellitus without complication (HCC)   . Heart failure of unknown type (HCC) 06/2011  . Hypertension   . Obesity   . Thrombocytopenia (HCC)     MEDICATIONS AT HOME: Prior to Admission medications   Medication Sig Start Date End Date Taking? Authorizing Provider  aspirin EC 81 MG tablet Take 81 mg by mouth daily.    Yes [provider]  atorvastatin (LIPITOR) 20 MG tablet Take 1 tablet (20 mg total) by mouth daily. 03/16/17  Yes McClung, Angela M, PA-C  carvedilol (COREG) 25  MG tablet TAKE 1 TABLET BY MOUTH 2 TIMES DAILY WITH A MEAL. 03/16/17  Yes McClung, Angela M, PA-C  cyclobenzaprine (FLEXERIL) 10 MG tablet TAKE 1 TABLET BY MOUTH TWICE DAILY AS NEEDED FOR MUSCLE SPASMS. 12/09/16  Yes Amao, [name], MD  glipiZIDE (GLUCOTROL) 10 MG tablet TAKE 1 TABLET BY MOUTH 2 TIMES DAILY BEFORE A MEAL. 03/16/17  Yes Anders Simmonds, PA-C  spironolactone (ALDACTONE) 25 MG tablet Take 1 tablet (25 mg total) by mouth daily. 03/16/17  Yes Anders Simmonds, PA-C  EPINEPHrine (EPIPEN 2-PAK) 0.3 mg/0.3 mL IJ SOAJ injection Inject 0.3 mLs (0.3 mg total) into the muscle once. Patient not taking: Reported on 10/09/2015 12/05/14   Melene Plan, DO  ibuprofen (ADVIL,MOTRIN) 600 MG tablet Take 1 tablet (600 mg total) by mouth every 6 (six) hours as needed. Patient not taking: Reported on 03/16/2017 07/19/16   Elpidio Anis, PA-C  Insulin Isophane & Regular Human (HUMULIN 70/30 KWIKPEN) (70-30) 100 UNIT/ML PEN Inject 35 Units into the skin 2 (two) times daily. 03/16/17   Anders Simmonds, PA-C  Insulin Pen Needle 31G X 5 MM MISC 1 each by Does not apply route 2 (two) times daily. 03/16/17   Anders Simmonds, PA-C  Insulin Syringe-Needle U-100 (BD INSULIN SYRINGE ULTRAFINE) 31G X 15/64" 0.5 ML  MISC 1 each by Does not apply route 2 (two) times daily. 03/16/17   Anders SimmondsMcClung, Angela M, PA-C  losartan (COZAAR) 100 MG tablet Take 1 tablet (100 mg total) by mouth daily. 03/16/17   Anders SimmondsMcClung, Angela M, PA-C     Objective:  EXAM:   Vitals:   03/16/17 1122  BP: (!) 148/108  Pulse: (!) 102  Resp: 20  Temp: 98.8 F (37.1 C)  TempSrc: Oral  SpO2: 97%  Weight: 291 lb (132 kg)  Height: 6' (1.829 m)    General appearance : A&OX3. NAD. Non-toxic-appearing HEENT: Atraumatic and Normocephalic.  PERRLA. EOM intact.  Neck: supple, no JVD. No cervical lymphadenopathy. No thyromegaly Chest/Lungs:  Breathing-non-labored, Good air entry bilaterally, breath sounds normal without rales, rhonchi, or wheezing    CVS: S1 S2 regular, no murmurs, gallops, rubs  Extremities: Bilateral Lower Ext shows no edema, both legs are warm to touch with = pulse throughout Neurology:  CN II-XII grossly intact, Non focal.   Psych:  TP linear. J/I WNL. Normal speech. Appropriate eye contact and affect.  Skin:  No Rash  Data Review Lab Results  Component Value Date   HGBA1C 11.8 03/16/2017   HGBA1C 10.0 05/27/2016   HGBA1C 8.9 10/09/2015     Assessment & Plan   1. Diabetes mellitus due to underlying condition with hyperglycemia, with long-term current use of insulin (HCC) I have encouraged him that all diabetic patients should be seen at least every 3 months.   Uncontrolled. Out of meds. Not checking sugars.  Check sugar fasting and at bedtime and record and bring to next visit.   - Glucose (CBG) - HgB A1c - Urinalysis Dipstick - insulin aspart (novoLOG) injection 30 Units - Glucose (CBG) - glipiZIDE (GLUCOTROL) 10 MG tablet; TAKE 1 TABLET BY MOUTH 2 TIMES DAILY BEFORE A MEAL.  Dispense: 180 tablet; Refill: 1 - Insulin Isophane & Regular Human (HUMULIN 70/30 KWIKPEN) (70-30) 100 UNIT/ML PEN; Inject 35 Units into the skin 2 (two) times daily.  Dispense: 60 mL; Refill: 3 -  2. Essential hypertension Uncontrolled but out of meds X 3 weeks.  Check BP OOO and record at least 3 times weekly.   - carvedilol (COREG) 25 MG tablet; TAKE 1 TABLET BY MOUTH 2 TIMES DAILY WITH A MEAL.  Dispense: 180 tablet; Refill: 1 - losartan (COZAAR) 100 MG tablet; Take 1 tablet (100 mg total) by mouth daily.  Dispense: 90 tablet; Refill: 1 - spironolactone (ALDACTONE) 25 MG tablet; Take 1 tablet (25 mg total) by mouth daily.  Dispense: 90 tablet; Refill: 0 - Insulin Pen Needle 31G X 5 MM MISC; 1 each by Does not apply route 2 (two) times daily.  Dispense: 180 each; Refill: 1  3. Chronic systolic congestive heart failure (HCC) - carvedilol (COREG) 25 MG tablet; TAKE 1 TABLET BY MOUTH 2 TIMES DAILY WITH A MEAL.  Dispense: 180  tablet; Refill: 1 - losartan (COZAAR) 100 MG tablet; Take 1 tablet (100 mg total) by mouth daily.  Dispense: 90 tablet; Refill: 1 - spironolactone (ALDACTONE) 25 MG tablet; Take 1 tablet (25 mg total) by mouth daily.  Dispense: 90 tablet; Refill: 0  4. Pure hypercholesterolemia  atorvastatin (LIPITOR) 20 MG tablet; Take 1 tablet (20 mg total) by mouth daily.  Dispense: 90 tablet; Refill: 1  I spent more than 40 mins face to face with him and counseled him on diabetic diet, exercise, compliance with medications, etc.   Patient have been counseled extensively about nutrition and exercise  Return in about 3 weeks (around 04/06/2017) for BP and DM check with Viann FishStacey Hammer and 3 mth f/up with Dr Alvis LemmingsNewlin. bring blood sugar and BP log  The patient was given clear instructions to go to ER or return to medical center if symptoms don't improve, worsen or new problems develop. The patient verbalized understanding. The patient was told to call to get lab results if they haven't heard anything in the next week.     Georgian CoAngela McClung, PA-C [facility] [place], [place] [phone]   03/16/2017, 12:52 PM

## 2017-04-07 ENCOUNTER — Ambulatory Visit: Payer: Self-pay | Admitting: Pharmacist

## 2017-04-07 NOTE — Progress Notes (Deleted)
    S:     No chief complaint on file.   Patient arrives ***.  Presents for diabetes evaluation, education, and management.  Patient reports Diabetes was diagnosed in ***.   Family/Social History:   Patient {Actions; denies-reports:120008} adherence with medications.  Current diabetes medications include: Humulin 70/30 35 units BID, glipizide 10 mg BID Current hypertension medications include: losartan 100 mg daily, spironolactone 25 mg BID, carvedilol 25 mg BID  Patient {Actions; denies-reports:120008} hypoglycemic events.  Patient reported dietary habits: Eats *** meals/day Breakfast:*** Lunch:*** Dinner:*** Snacks:*** Drinks:***  Patient reported exercise habits:    Patient {Actions; denies-reports:120008} nocturia.  Patient {Actions; denies-reports:120008} neuropathy. Patient {Actions; denies-reports:120008} visual changes. Patient {Actions; denies-reports:120008} self foot exams.   O:  Physical Exam   ROS   Lab Results  Component Value Date   HGBA1C 11.8 03/16/2017   There were no vitals filed for this visit.  Home fasting CBG: ***  2 hour post-prandial/random CBG: ***.  10 year ASCVD risk: ***.  A/P: Diabetes longstanding currently uncontrolled based on A1c of 11.8. Patient {Actions; denies-reports:120008} hypoglycemic events and is able to verbalize appropriate hypoglycemia management plan. Patient {Actions; denies-reports:120008} adherence with medication. Control is suboptimal due to dietary indiscretion, sedentary lifestyle, non-adherence to medications and appts.  Next A1C anticipated March 2019.   Hypertension longstanding/newly diagnosed currently ***.  Patient {Actions; denies-reports:120008} adherence with medication. Control is suboptimal due to ***.  Written patient instructions provided.  Total time in face to face counseling *** minutes.   Follow up in Pharmacist Clinic Visit ***.   Patient seen with Enid DerryVictoria Mitchell, PharmD  Candidate

## 2017-04-22 MED FILL — ?ATORVASTATIN 20MG TABLET: 20 | 30 days supply | Qty: 30 | Fill #1

## 2017-04-22 MED FILL — LOSARTAN POTASSIUM 100 MG T: 100 | 30 days supply | Qty: 30 | Fill #1

## 2017-04-22 MED FILL — ?GLIPIZIDE 10 MG TABLET: 10 | 30 days supply | Qty: 60 | Fill #1

## 2017-04-22 MED FILL — $HUMULIN 70/30 KWIKPEN: (70-30) 100 | 30 days supply | Qty: 21 | Fill #1

## 2017-04-22 MED FILL — SPIRONOLACTONE 25 MG TABS: 25 | 30 days supply | Qty: 30 | Fill #2

## 2017-04-22 MED FILL — ?CARVEDILOL 25 MG TABLET: 25 | 30 days supply | Qty: 60 | Fill #1

## 2017-05-28 ENCOUNTER — Other Ambulatory Visit: Payer: Self-pay | Admitting: Family Medicine

## 2017-05-28 DIAGNOSIS — Z794 Long term (current) use of insulin: Secondary | ICD-10-CM

## 2017-05-28 DIAGNOSIS — I5022 Chronic systolic (congestive) heart failure: Secondary | ICD-10-CM

## 2017-05-28 DIAGNOSIS — E7800 Pure hypercholesterolemia, unspecified: Secondary | ICD-10-CM

## 2017-05-28 DIAGNOSIS — I10 Essential (primary) hypertension: Secondary | ICD-10-CM

## 2017-05-28 DIAGNOSIS — E119 Type 2 diabetes mellitus without complications: Secondary | ICD-10-CM

## 2017-05-28 DIAGNOSIS — M546 Pain in thoracic spine: Secondary | ICD-10-CM

## 2017-05-29 MED FILL — $HUMULIN 70/30 KWIKPEN: (70-30) 100 | 30 days supply | Qty: 21 | Fill #2

## 2017-05-29 MED FILL — glipiZIDE 10 MG TABS: 10 | 30 days supply | Qty: 60 | Fill #2

## 2017-05-29 MED FILL — CARVEDILOL 25 MG TABLET: 25 | 30 days supply | Qty: 60 | Fill #2

## 2017-05-29 MED FILL — LOSARTAN POTASSIUM 100 MG T: 100 | 30 days supply | Qty: 30 | Fill #2

## 2017-05-29 MED FILL — SPIRONOLACTONE 25 MG TABS: 25 | 30 days supply | Qty: 30 | Fill #0

## 2017-05-29 MED FILL — ATORVASTATIN 20 MG TABLET: 20 | 30 days supply | Qty: 30 | Fill #2

## 2017-06-12 ENCOUNTER — Ambulatory Visit: Payer: Self-pay | Admitting: Family Medicine

## 2017-06-17 ENCOUNTER — Ambulatory Visit: Payer: Self-pay | Admitting: Family Medicine

## 2017-06-29 ENCOUNTER — Ambulatory Visit: Payer: Self-pay | Admitting: Family Medicine

## 2017-07-02 MED FILL — ATORVASTATIN 20 MG TABLET: 20 | 30 days supply | Qty: 30 | Fill #3

## 2017-07-02 MED FILL — SPIRONOLACTONE 25 MG TABS: 25 | 30 days supply | Qty: 30 | Fill #1

## 2017-07-02 MED FILL — CARVEDILOL 25 MG TABS: 25 | 30 days supply | Qty: 60 | Fill #3

## 2017-07-02 MED FILL — glipiZIDE 10 MG TABS: 10 | 30 days supply | Qty: 60 | Fill #3

## 2017-07-02 MED FILL — $HUMULIN 70/30 KWIKPEN: (70-30) 100 | 30 days supply | Qty: 21 | Fill #3

## 2017-07-02 MED FILL — LOSARTAN POTASSIUM 100 MG T: 100 | 30 days supply | Qty: 30 | Fill #3

## 2017-08-04 MED FILL — ATORVASTATIN 20 MG TABLET: 20 | 30 days supply | Qty: 30 | Fill #4

## 2017-08-04 MED FILL — $HUMULIN 70/30 KWIKPEN: (70-30) 100 | 30 days supply | Qty: 21 | Fill #4

## 2017-08-04 MED FILL — SPIRONOLACTONE 25 MG TABLET: 25 | 30 days supply | Qty: 30 | Fill #2

## 2017-08-04 MED FILL — glipiZIDE 10 MG TABS: 10 | 30 days supply | Qty: 60 | Fill #4

## 2017-08-04 MED FILL — LOSARTAN POTASSIUM 100 MG T: 100 | 30 days supply | Qty: 30 | Fill #4

## 2017-08-04 MED FILL — CARVEDILOL 25 MG TABLET: 25 | 30 days supply | Qty: 60 | Fill #4

## 2017-09-04 ENCOUNTER — Other Ambulatory Visit: Payer: Self-pay | Admitting: Physician Assistant

## 2017-09-04 DIAGNOSIS — I5022 Chronic systolic (congestive) heart failure: Secondary | ICD-10-CM

## 2017-09-04 DIAGNOSIS — E119 Type 2 diabetes mellitus without complications: Secondary | ICD-10-CM

## 2017-09-04 DIAGNOSIS — Z794 Long term (current) use of insulin: Secondary | ICD-10-CM

## 2017-09-04 DIAGNOSIS — E7800 Pure hypercholesterolemia, unspecified: Secondary | ICD-10-CM

## 2017-09-04 DIAGNOSIS — I10 Essential (primary) hypertension: Secondary | ICD-10-CM

## 2017-09-04 MED FILL — SPIRONOLACTONE 25 MG TABLET: 25 | 30 days supply | Qty: 30 | Fill #0

## 2017-09-04 MED FILL — glipiZIDE 10 MG TABS: 10 | 30 days supply | Qty: 60 | Fill #5

## 2017-09-04 MED FILL — LOSARTAN POTASSIUM 100 MG T: 100 | 30 days supply | Qty: 30 | Fill #5

## 2017-09-04 MED FILL — CARVEDILOL 25 MG TABLET: 25 | 30 days supply | Qty: 60 | Fill #5

## 2017-09-04 MED FILL — $HUMULIN 70/30 KWIKPEN: (70-30) 100 | 30 days supply | Qty: 21 | Fill #5

## 2017-09-04 MED FILL — ATORVASTATIN 20 MG TABLET: 20 | 30 days supply | Qty: 30 | Fill #5

## 2017-10-13 ENCOUNTER — Other Ambulatory Visit: Payer: Self-pay

## 2017-10-13 ENCOUNTER — Other Ambulatory Visit: Payer: Self-pay | Admitting: Physician Assistant

## 2017-10-13 DIAGNOSIS — I5022 Chronic systolic (congestive) heart failure: Secondary | ICD-10-CM

## 2017-10-13 DIAGNOSIS — E7800 Pure hypercholesterolemia, unspecified: Secondary | ICD-10-CM

## 2017-10-13 DIAGNOSIS — E119 Type 2 diabetes mellitus without complications: Secondary | ICD-10-CM

## 2017-10-13 DIAGNOSIS — Z794 Long term (current) use of insulin: Secondary | ICD-10-CM

## 2017-10-13 DIAGNOSIS — I10 Essential (primary) hypertension: Secondary | ICD-10-CM

## 2017-10-13 MED ORDER — INSULIN ISOPHANE & REGULAR (HUMAN 70-30)100 UNIT/ML KWIKPEN: 35.0000 [IU] | PEN_INJECTOR | Freq: Two times a day (BID) | SUBCUTANEOUS | 0 refills | Status: DC

## 2017-10-13 MED ORDER — LOSARTAN POTASSIUM 100 MG PO TABS: 100.0000 mg | ORAL_TABLET | Freq: Every day | ORAL | 0 refills | Status: DC

## 2017-10-13 MED ORDER — ATORVASTATIN CALCIUM 20 MG PO TABS: 20.0000 mg | ORAL_TABLET | Freq: Every day | ORAL | 0 refills | Status: DC

## 2017-10-13 MED FILL — SPIRONOLACTONE 25 MG TABLET: 25 | 30 days supply | Qty: 30 | Fill #1

## 2017-10-13 MED FILL — ATORVASTATIN 20 MG TABLET: 20 | 30 days supply | Qty: 30 | Fill #0

## 2017-10-13 MED FILL — LOSARTAN POTASSIUM 100 MG T: 100 | 30 days supply | Qty: 30 | Fill #0

## 2017-10-13 MED FILL — $HUMULIN 70/30 KWIKPEN: (70-30) 100 | 30 days supply | Qty: 21 | Fill #6

## 2017-10-15 MED FILL — TRUEPLUS PEN NDL 31GX3/16: 31G X 5 MM | 50 days supply | Qty: 100 | Fill #1

## 2017-10-15 MED FILL — TRUEPLUS PEN NDL 31GX3/16": 31G X 5 MM | 50 days supply | Qty: 100 | Fill #1

## 2017-10-21 ENCOUNTER — Ambulatory Visit: Payer: Self-pay | Attending: Family Medicine | Admitting: Physician Assistant

## 2017-10-21 ENCOUNTER — Ambulatory Visit: Payer: Self-pay

## 2017-10-21 VITALS — BP 137/71 | HR 55 | Temp 98.5°F | Resp 18 | Ht 73.0 in | Wt 290.0 lb

## 2017-10-21 DIAGNOSIS — I10 Essential (primary) hypertension: Secondary | ICD-10-CM

## 2017-10-21 DIAGNOSIS — E669 Obesity, unspecified: Secondary | ICD-10-CM | POA: Insufficient documentation

## 2017-10-21 DIAGNOSIS — I110 Hypertensive heart disease with heart failure: Secondary | ICD-10-CM | POA: Insufficient documentation

## 2017-10-21 DIAGNOSIS — E1165 Type 2 diabetes mellitus with hyperglycemia: Secondary | ICD-10-CM | POA: Insufficient documentation

## 2017-10-21 DIAGNOSIS — E7800 Pure hypercholesterolemia, unspecified: Secondary | ICD-10-CM | POA: Insufficient documentation

## 2017-10-21 DIAGNOSIS — E0865 Diabetes mellitus due to underlying condition with hyperglycemia: Secondary | ICD-10-CM | POA: Insufficient documentation

## 2017-10-21 DIAGNOSIS — Z7982 Long term (current) use of aspirin: Secondary | ICD-10-CM | POA: Insufficient documentation

## 2017-10-21 DIAGNOSIS — D696 Thrombocytopenia, unspecified: Secondary | ICD-10-CM | POA: Insufficient documentation

## 2017-10-21 DIAGNOSIS — Z79899 Other long term (current) drug therapy: Secondary | ICD-10-CM | POA: Insufficient documentation

## 2017-10-21 DIAGNOSIS — I5022 Chronic systolic (congestive) heart failure: Secondary | ICD-10-CM | POA: Insufficient documentation

## 2017-10-21 DIAGNOSIS — Z794 Long term (current) use of insulin: Secondary | ICD-10-CM | POA: Insufficient documentation

## 2017-10-21 LAB — GLUCOSE, POCT (MANUAL RESULT ENTRY): POC Glucose: 252 mg/dl — AB (ref 70–99)

## 2017-10-21 MED ORDER — SPIRONOLACTONE 25 MG PO TABS: 25.0000 mg | ORAL_TABLET | Freq: Every day | ORAL | 1 refills | Status: DC

## 2017-10-21 MED ORDER — INSULIN ISOPHANE & REGULAR (HUMAN 70-30)100 UNIT/ML KWIKPEN: 40.0000 [IU] | PEN_INJECTOR | Freq: Two times a day (BID) | SUBCUTANEOUS | 0 refills | Status: DC

## 2017-10-21 MED ORDER — GLIPIZIDE 10 MG PO TABS: ORAL_TABLET | ORAL | 1 refills | Status: DC

## 2017-10-21 MED ORDER — ATORVASTATIN CALCIUM 20 MG PO TABS: 20.0000 mg | ORAL_TABLET | Freq: Every day | ORAL | 0 refills | Status: DC

## 2017-10-21 MED ORDER — CARVEDILOL 25 MG PO TABS
ORAL_TABLET | ORAL | 1 refills | Status: DC
Start: 2017-10-21 — End: 2018-01-19

## 2017-10-21 NOTE — Progress Notes (Signed)
Patient ID: Dave Arnold, male   DOB: [birth year], 47 y.o.   MRN: [id number]   Dave Arnold, is a 47 y.o. male  [id number]  [id number]  DOB - Sep 17, 1970  Subjective:  Chief Complaint and HPI: Dave Arnold is a 47 y.o. male here today for RF.  Doing well.  Compliant with meds.  His blood sugar is usu in 200s when he checks it but doesn't check it often.  Requests 3 month supply of meds.  Denies CP/SOB/dizziness.    ROS:   Constitutional:  No f/c, No night sweats, No unexplained weight loss. EENT:  No vision changes, No blurry vision, No hearing changes. No mouth, throat, or ear problems.  Respiratory: No cough, No SOB Cardiac: No CP, no palpitations GI:  No abd pain, No N/V/D. GU: No Urinary s/sx Musculoskeletal: No joint pain Neuro: No headache, no dizziness, no motor weakness.  Skin: No rash Endocrine:  No polydipsia. No polyuria.  Psych: Denies SI/HI  No problems updated.  ALLERGIES: Allergies  Allergen Reactions  . Lisinopril     cough    PAST MEDICAL HISTORY: Past Medical History:  Diagnosis Date  . Diabetes mellitus without complication (HCC)   . Heart failure of unknown type (HCC) 06/2011  . Hypertension   . Obesity   . Thrombocytopenia (HCC)     MEDICATIONS AT HOME: Prior to Admission medications   Medication Sig Start Date End Date Taking? Authorizing Provider  aspirin EC 81 MG tablet Take 81 mg by mouth daily.    Yes [provider]  atorvastatin (LIPITOR) 20 MG tablet Take 1 tablet (20 mg total) by mouth daily. 10/21/17  Yes Judithann Villamar, Marzella Schlein, PA-C  carvedilol (COREG) 25 MG tablet TAKE 1 TABLET BY MOUTH 2 TIMES DAILY WITH A MEAL. 10/21/17  Yes Kayslee Furey M, PA-C  cyclobenzaprine (FLEXERIL) 10 MG tablet TAKE 1 TABLET BY MOUTH TWICE DAILY AS NEEDED FOR MUSCLE SPASMS. 12/09/16  Yes Newlin, [name], MD  glipiZIDE (GLUCOTROL) 10 MG tablet TAKE 1 TABLET BY MOUTH 2 TIMES DAILY BEFORE A MEAL. 10/21/17  Yes Ramaya Guile M, PA-C    Insulin Isophane & Regular Human (HUMULIN 70/30 KWIKPEN) (70-30) 100 UNIT/ML PEN Inject 40 Units into the skin 2 (two) times daily. 10/21/17  Yes Mahmud Keithly M, PA-C  Insulin Pen Needle 31G X 5 MM MISC 1 each by Does not apply route 2 (two) times daily. 03/16/17  Yes Lataria Courser M, PA-C  Insulin Syringe-Needle U-100 (BD INSULIN SYRINGE ULTRAFINE) 31G X 15/64" 0.5 ML MISC 1 each by Does not apply route 2 (two) times daily. 03/16/17  Yes Georgian Co M, PA-C  losartan (COZAAR) 100 MG tablet Take 1 tablet (100 mg total) by mouth daily. 10/13/17  Yes Newlin, [name], MD  EPINEPHrine (EPIPEN 2-PAK) 0.3 mg/0.3 mL IJ SOAJ injection Inject 0.3 mLs (0.3 mg total) into the muscle once. Patient not taking: Reported on 10/09/2015 12/05/14   Melene Plan, DO  ibuprofen (ADVIL,MOTRIN) 600 MG tablet Take 1 tablet (600 mg total) by mouth every 6 (six) hours as needed. Patient not taking: Reported on 03/16/2017 07/19/16   Elpidio Anis, PA-C  spironolactone (ALDACTONE) 25 MG tablet Take 1 tablet (25 mg total) by mouth daily. 10/21/17   Anders Simmonds, PA-C     Objective:  EXAM:   Vitals:   10/21/17 1128  BP: 137/71  Pulse: (!) 55  Resp: 18  Temp: 98.5 F (36.9 C)  TempSrc: Oral  SpO2: 97%  Weight: 290 lb (131.5  kg)  Height: 6\' 1"  (1.854 m)    General appearance : A&OX3. NAD. Non-toxic-appearing HEENT: Atraumatic and Normocephalic.  PERRLA. EOM intact.  Neck: supple, no JVD. No cervical lymphadenopathy. No thyromegaly Chest/Lungs:  Breathing-non-labored, Good air entry bilaterally, breath sounds normal without rales, rhonchi, or wheezing  CVS: S1 S2 regular, no murmurs, gallops, rubs  Extremities: Bilateral Lower Ext shows no edema, both legs are warm to touch with = pulse throughout Neurology:  CN II-XII grossly intact, Non focal.   Psych:  TP linear. J/I WNL. Normal speech. Appropriate eye contact and affect.  Skin:  No Rash  Data Review Lab Results  Component Value Date   HGBA1C  11.8 03/16/2017   HGBA1C 10.0 05/27/2016   HGBA1C 8.9 10/09/2015     Assessment & Plan   1. Diabetes mellitus due to underlying condition with hyperglycemia, with long-term current use of insulin (HCC) Uncontrolled-based on hoe readings will increase insulin from 35 bid to 40 units bid.  Check blood sugars fasting and at bedtime - Glucose (CBG) - Urinalysis Dipstick - Comprehensive metabolic panel - Hemoglobin A1c - Insulin Isophane & Regular Human (HUMULIN 70/30 KWIKPEN) (70-30) 100 UNIT/ML PEN; Inject 40 Units into the skin 2 (two) times daily.  Dispense: 60 mL; Refill: 0 - glipiZIDE (GLUCOTROL) 10 MG tablet; TAKE 1 TABLET BY MOUTH 2 TIMES DAILY BEFORE A MEAL.  Dispense: 180 tablet; Refill: 1  2. Chronic systolic congestive heart failure (HCC) - spironolactone (ALDACTONE) 25 MG tablet; Take 1 tablet (25 mg total) by mouth daily.  Dispense: 90 tablet; Refill: 1  3. Essential hypertension Controlled-continue current regimen - spironolactone (ALDACTONE) 25 MG tablet; Take 1 tablet (25 mg total) by mouth daily.  Dispense: 90 tablet; Refill: 1 - Insulin Isophane & Regular Human (HUMULIN 70/30 KWIKPEN) (70-30) 100 UNIT/ML PEN; Inject 40 Units into the skin 2 (two) times daily.  Dispense: 60 mL; Refill: 0 - glipiZIDE (GLUCOTROL) 10 MG tablet; TAKE 1 TABLET BY MOUTH 2 TIMES DAILY BEFORE A MEAL.  Dispense: 180 tablet; Refill: 1 - carvedilol (COREG) 25 MG tablet; TAKE 1 TABLET BY MOUTH 2 TIMES DAILY WITH A MEAL.  Dispense: 180 tablet; Refill: 1 - atorvastatin (LIPITOR) 20 MG tablet; Take 1 tablet (20 mg total) by mouth daily.  Dispense: 30 tablet; Refill: 0  4. Pure hypercholesterolemia - atorvastatin (LIPITOR) 20 MG tablet; Take 1 tablet (20 mg total) by mouth daily.  Dispense: 30 tablet; Refill: 0 - Lipid panel  counseled on diabetic diet  Patient have been counseled extensively about nutrition and exercise  Return in about 3 months (around 01/21/2018) for Dr Alvis LemmingsNewlin; BP and DM.  The  patient was given clear instructions to go to ER or return to medical center if symptoms don't improve, worsen or new problems develop. The patient verbalized understanding. The patient was told to call to get lab results if they haven't heard anything in the next week.     Georgian CoAngela Ilyas Lipsitz, PA-C [facility] [place] , [place] [phone]   10/21/2017, 12:05 PM

## 2017-10-21 NOTE — Patient Instructions (Signed)
Check blood sugars fasting and at bedtime  

## 2017-10-22 ENCOUNTER — Other Ambulatory Visit: Payer: Self-pay | Admitting: Physician Assistant

## 2017-10-22 LAB — LIPID PANEL
CHOL/HDL RATIO: 4.4 ratio (ref 0.0–5.0)
Cholesterol, Total: 106 mg/dL (ref 100–199)
HDL: 24 mg/dL — ABNORMAL LOW (ref >39)
LDL Calculated: 33 mg/dL (ref 0–99)
TRIGLYCERIDES: 246 mg/dL — AB (ref 0–149)
VLDL Cholesterol Cal: 49 mg/dL — ABNORMAL HIGH (ref 5–40)

## 2017-10-22 LAB — COMPREHENSIVE METABOLIC PANEL
ALT: 18 IU/L (ref 0–44)
AST: 18 IU/L (ref 0–40)
Albumin/Globulin Ratio: 1.4 (ref 1.2–2.2)
Albumin: 4.2 g/dL (ref 3.5–5.5)
Alkaline Phosphatase: 108 IU/L (ref 39–117)
BUN / CREAT RATIO: 10 (ref 9–20)
BUN: 9 mg/dL (ref 6–24)
Bilirubin Total: 0.7 mg/dL (ref 0.0–1.2)
CALCIUM: 9.3 mg/dL (ref 8.7–10.2)
CHLORIDE: 101 mmol/L (ref 96–106)
CO2: 25 mmol/L (ref 20–29)
Creatinine, Ser: 0.87 mg/dL (ref 0.76–1.27)
GFR calc non Af Amer: 105 mL/min/{1.73_m2} (ref >59)
GFR, EST AFRICAN AMERICAN: 121 mL/min/{1.73_m2} (ref >59)
Globulin, Total: 3 g/dL (ref 1.5–4.5)
Glucose: 257 mg/dL — ABNORMAL HIGH (ref 65–99)
Potassium: 4 mmol/L (ref 3.5–5.2)
Sodium: 143 mmol/L (ref 134–144)
Total Protein: 7.2 g/dL (ref 6.0–8.5)

## 2017-10-22 LAB — HEMOGLOBIN A1C
Est. average glucose Bld gHb Est-mCnc: 243 mg/dL
Hgb A1c MFr Bld: 10.1 % — ABNORMAL HIGH (ref 4.8–5.6)

## 2017-10-22 MED ORDER — FISH OIL 1000 MG PO CAPS: 3.0000 | ORAL_CAPSULE | Freq: Every day | ORAL | 5 refills | Status: DC

## 2017-10-23 ENCOUNTER — Telehealth: Payer: Self-pay | Admitting: *Deleted

## 2017-10-23 NOTE — Telephone Encounter (Signed)
-----   Message from Anders SimmondsAngela M McClung, [place]-C sent at 10/22/2017 10:25 AM EDT ----- Please call patient.  Labs ok other than blood sugar and triglycerides both being high.  A1C is 10.1.  Take insulin at the higher dose we discussed, check blood sugars and follow up as planned.  I sent him a prescription for fish oil to start for triglycerides.  Thanks, Georgian CoAngela McClung, PA-C

## 2017-10-23 NOTE — Telephone Encounter (Signed)
Patient verified DOB Patient is aware of labs being okay, DM and Triglycerides being elevated. Patient advised to take omega 3 to address cholesterol and increase insulin to address HIGH DM. Patient is aware of all refills being placed and to follow up as planned. No further questions.

## 2017-10-28 MED FILL — OMEGA-3 ETHYL ESTERS 1 GM C: 1 | 30 days supply | Qty: 90 | Fill #0

## 2017-10-29 MED FILL — SPIRONOLACTONE 25 MG TABLET: 25 | 30 days supply | Qty: 30 | Fill #0

## 2017-10-29 MED FILL — $HUMULIN 70/30 KWIKPEN: (70-30) 100 | 30 days supply | Qty: 24 | Fill #0

## 2017-11-02 MED FILL — CARVEDILOL 25 MG TABLET: 25 | 90 days supply | Qty: 180 | Fill #0

## 2017-11-02 MED FILL — glipiZIDE 10 MG TABS: 10 | 90 days supply | Qty: 180 | Fill #0

## 2017-11-02 MED FILL — ATORVASTATIN 20 MG TABLET: 20 | 30 days supply | Qty: 30 | Fill #0

## 2017-11-06 ENCOUNTER — Other Ambulatory Visit: Payer: Self-pay | Admitting: Family Medicine

## 2017-11-06 DIAGNOSIS — E119 Type 2 diabetes mellitus without complications: Secondary | ICD-10-CM

## 2017-11-06 DIAGNOSIS — Z794 Long term (current) use of insulin: Secondary | ICD-10-CM

## 2017-11-06 DIAGNOSIS — I10 Essential (primary) hypertension: Secondary | ICD-10-CM

## 2017-11-06 DIAGNOSIS — E7800 Pure hypercholesterolemia, unspecified: Secondary | ICD-10-CM

## 2017-11-06 DIAGNOSIS — I5022 Chronic systolic (congestive) heart failure: Secondary | ICD-10-CM

## 2017-11-20 MED FILL — LOSARTAN POTASSIUM 100 MG T: 100 | 90 days supply | Qty: 90 | Fill #0

## 2017-12-10 ENCOUNTER — Other Ambulatory Visit: Payer: Self-pay | Admitting: Physician Assistant

## 2017-12-10 DIAGNOSIS — E7800 Pure hypercholesterolemia, unspecified: Secondary | ICD-10-CM

## 2017-12-10 DIAGNOSIS — Z794 Long term (current) use of insulin: Secondary | ICD-10-CM

## 2017-12-10 DIAGNOSIS — I5022 Chronic systolic (congestive) heart failure: Secondary | ICD-10-CM

## 2017-12-10 DIAGNOSIS — I10 Essential (primary) hypertension: Secondary | ICD-10-CM

## 2017-12-10 DIAGNOSIS — E119 Type 2 diabetes mellitus without complications: Secondary | ICD-10-CM

## 2017-12-10 MED FILL — OMEGA-3 ETHYL ESTERS 1 GM C: 1 | 30 days supply | Qty: 90 | Fill #1

## 2017-12-10 MED FILL — SPIRONOLACTONE 25 MG TABLET: 25 | 30 days supply | Qty: 30 | Fill #1

## 2017-12-10 MED FILL — $HUMULIN 70/30 KWIKPEN: (70-30) 100 | 30 days supply | Qty: 24 | Fill #1

## 2017-12-11 MED FILL — TRUEPLUS PEN NDL 31GX3/16: 31G X 5 MM | 30 days supply | Qty: 100 | Fill #0

## 2017-12-11 MED FILL — ATORVASTATIN 20 MG TABLET: 20 | 60 days supply | Qty: 60 | Fill #0

## 2017-12-11 MED FILL — TRUEPLUS PEN NDL 31GX3/16": 31G X 5 MM | 30 days supply | Qty: 100 | Fill #0

## 2018-01-18 ENCOUNTER — Ambulatory Visit: Payer: Self-pay | Admitting: Family Medicine

## 2018-01-19 ENCOUNTER — Encounter: Payer: Self-pay | Admitting: Family Medicine

## 2018-01-19 ENCOUNTER — Ambulatory Visit: Payer: Self-pay | Attending: Family Medicine | Admitting: Family Medicine

## 2018-01-19 VITALS — BP 165/91 | HR 63 | Temp 98.3°F | Ht 73.0 in | Wt 286.2 lb

## 2018-01-19 DIAGNOSIS — I110 Hypertensive heart disease with heart failure: Secondary | ICD-10-CM | POA: Insufficient documentation

## 2018-01-19 DIAGNOSIS — E669 Obesity, unspecified: Secondary | ICD-10-CM | POA: Insufficient documentation

## 2018-01-19 DIAGNOSIS — E785 Hyperlipidemia, unspecified: Secondary | ICD-10-CM | POA: Insufficient documentation

## 2018-01-19 DIAGNOSIS — E7800 Pure hypercholesterolemia, unspecified: Secondary | ICD-10-CM | POA: Insufficient documentation

## 2018-01-19 DIAGNOSIS — M546 Pain in thoracic spine: Secondary | ICD-10-CM | POA: Insufficient documentation

## 2018-01-19 DIAGNOSIS — Z6837 Body mass index (BMI) 37.0-37.9, adult: Secondary | ICD-10-CM | POA: Insufficient documentation

## 2018-01-19 DIAGNOSIS — Z888 Allergy status to other drugs, medicaments and biological substances status: Secondary | ICD-10-CM | POA: Insufficient documentation

## 2018-01-19 DIAGNOSIS — E0865 Diabetes mellitus due to underlying condition with hyperglycemia: Secondary | ICD-10-CM

## 2018-01-19 DIAGNOSIS — Z7982 Long term (current) use of aspirin: Secondary | ICD-10-CM | POA: Insufficient documentation

## 2018-01-19 DIAGNOSIS — Z23 Encounter for immunization: Secondary | ICD-10-CM

## 2018-01-19 DIAGNOSIS — N481 Balanitis: Secondary | ICD-10-CM | POA: Insufficient documentation

## 2018-01-19 DIAGNOSIS — Z794 Long term (current) use of insulin: Secondary | ICD-10-CM | POA: Insufficient documentation

## 2018-01-19 DIAGNOSIS — I10 Essential (primary) hypertension: Secondary | ICD-10-CM

## 2018-01-19 DIAGNOSIS — I5022 Chronic systolic (congestive) heart failure: Secondary | ICD-10-CM | POA: Insufficient documentation

## 2018-01-19 DIAGNOSIS — Z79899 Other long term (current) drug therapy: Secondary | ICD-10-CM | POA: Insufficient documentation

## 2018-01-19 DIAGNOSIS — E119 Type 2 diabetes mellitus without complications: Secondary | ICD-10-CM | POA: Insufficient documentation

## 2018-01-19 LAB — POCT GLYCOSYLATED HEMOGLOBIN (HGB A1C): Hemoglobin A1C: 10.1 % — AB (ref 4.0–5.6)

## 2018-01-19 LAB — GLUCOSE, POCT (MANUAL RESULT ENTRY): POC GLUCOSE: 325 mg/dL — AB (ref 70–99)

## 2018-01-19 MED ORDER — LIRAGLUTIDE 18 MG/3ML ~~LOC~~ SOPN: PEN_INJECTOR | SUBCUTANEOUS | 3 refills | Status: DC

## 2018-01-19 MED ORDER — CARVEDILOL 25 MG PO TABS: ORAL_TABLET | ORAL | 1 refills | Status: DC

## 2018-01-19 MED ORDER — CYCLOBENZAPRINE HCL 10 MG PO TABS: ORAL_TABLET | ORAL | 1 refills | Status: DC

## 2018-01-19 MED ORDER — LOSARTAN POTASSIUM 100 MG PO TABS: 100.0000 mg | ORAL_TABLET | Freq: Every day | ORAL | 1 refills | Status: DC

## 2018-01-19 MED ORDER — SPIRONOLACTONE 25 MG PO TABS: 25.0000 mg | ORAL_TABLET | Freq: Every day | ORAL | 1 refills | Status: DC

## 2018-01-19 MED ORDER — ATORVASTATIN CALCIUM 20 MG PO TABS: 20.0000 mg | ORAL_TABLET | Freq: Every day | ORAL | 1 refills | Status: DC

## 2018-01-19 MED ORDER — CLOTRIMAZOLE 1 % EX CREA: 1.0000 "application " | TOPICAL_CREAM | Freq: Two times a day (BID) | CUTANEOUS | 0 refills | Status: AC

## 2018-01-19 MED ORDER — INSULIN ISOPHANE & REGULAR (HUMAN 70-30)100 UNIT/ML KWIKPEN: 40.0000 [IU] | PEN_INJECTOR | Freq: Two times a day (BID) | SUBCUTANEOUS | 6 refills | Status: DC

## 2018-01-19 MED FILL — SPIRONOLACTONE 25 MG TABLET: 25 | 90 days supply | Qty: 90 | Fill #2

## 2018-01-19 MED FILL — OMEGA-3 ETHYL ESTERS 1 GM C: 1 | 90 days supply | Qty: 270 | Fill #2

## 2018-01-19 MED FILL — $HUMULIN 70/30 KWIKPEN: (70-30) 100 | 11 days supply | Qty: 9 | Fill #2

## 2018-01-19 MED FILL — !VICTOZA 18MG/3ML INJECT: 18 | 14 days supply | Qty: 3 | Fill #0

## 2018-01-19 MED FILL — CYCLOBENZAPRINE 10 MG TAB: 10 | 60 days supply | Qty: 120 | Fill #0

## 2018-01-19 NOTE — Patient Instructions (Signed)
Liraglutide injection What is this medicine? LIRAGLUTIDE (LIR a GLOO tide) is used to improve blood sugar control in adults with type 2 diabetes. This medicine may be used with other diabetes medicines. This drug may also reduce the risk of heart attack or stroke if you have type 2 diabetes and risk factors for heart disease. This medicine may be used for other purposes; ask your health care provider or pharmacist if you have questions. COMMON BRAND NAME(S): Victoza What should I tell my health care provider before I take this medicine? They need to know if you have any of these conditions: -endocrine tumors (MEN 2) or if someone in your family had these tumors -gallbladder disease -high cholesterol -history of alcohol abuse problem -history of pancreatitis -kidney disease or if you are on dialysis -liver disease -previous swelling of the tongue, face, or lips with difficulty breathing, difficulty swallowing, hoarseness, or tightening of the throat -stomach problems -thyroid cancer or if someone in your family had thyroid cancer -an unusual or allergic reaction to liraglutide, other medicines, foods, dyes, or preservatives -pregnant or trying to get pregnant -breast-feeding How should I use this medicine? This medicine is for injection under the skin of your upper leg, stomach area, or upper arm. You will be taught how to prepare and give this medicine. Use exactly as directed. Take your medicine at regular intervals. Do not take it more often than directed. It is important that you put your used needles and syringes in a special sharps container. Do not put them in a trash can. If you do not have a sharps container, call your pharmacist or healthcare provider to get one. A special MedGuide will be given to you by the pharmacist with each prescription and refill. Be sure to read this information carefully each time. Talk to your pediatrician regarding the use of this medicine in children.  Special care may be needed. Overdosage: If you think you have taken too much of this medicine contact a poison control center or emergency room at once. NOTE: This medicine is only for you. Do not share this medicine with others. What if I miss a dose? If you miss a dose, take it as soon as you can. If it is almost time for your next dose, take only that dose. Do not take double or extra doses. What may interact with this medicine? -other medicines for diabetes Many medications may cause changes in blood sugar, these include: -alcohol containing beverages -antiviral medicines for HIV or AIDS -aspirin and aspirin-like drugs -certain medicines for blood pressure, heart disease, irregular heart beat -chromium -diuretics -male hormones, such as estrogens or progestins, birth control pills -fenofibrate -gemfibrozil -isoniazid -lanreotide -male hormones or anabolic steroids -MAOIs like Carbex, Eldepryl, Marplan, Nardil, and Parnate -medicines for weight loss -medicines for allergies, asthma, cold, or cough -medicines for depression, anxiety, or psychotic disturbances -niacin -nicotine -NSAIDs, medicines for pain and inflammation, like ibuprofen or naproxen -octreotide -pasireotide -pentamidine -phenytoin -probenecid -quinolone antibiotics such as ciprofloxacin, levofloxacin, ofloxacin -some herbal dietary supplements -steroid medicines such as prednisone or cortisone -sulfamethoxazole; trimethoprim -thyroid hormones Some medications can hide the warning symptoms of low blood sugar (hypoglycemia). You may need to monitor your blood sugar more closely if you are taking one of these medications. These include: -beta-blockers, often used for high blood pressure or heart problems (examples include atenolol, metoprolol, propranolol) -clonidine -guanethidine -reserpine This list may not describe all possible interactions. Give your health care provider a list of all the medicines,    herbs, non-prescription drugs, or dietary supplements you use. Also tell them if you smoke, drink alcohol, or use illegal drugs. Some items may interact with your medicine. What should I watch for while using this medicine? Visit your doctor or health care professional for regular checks on your progress. Drink plenty of fluids while taking this medicine. Check with your doctor or health care professional if you get an attack of severe diarrhea, nausea, and vomiting. The loss of too much body fluid can make it dangerous for you to take this medicine. A test called the HbA1C (A1C) will be monitored. This is a simple blood test. It measures your blood sugar control over the last 2 to 3 months. You will receive this test every 3 to 6 months. Learn how to check your blood sugar. Learn the symptoms of low and high blood sugar and how to manage them. Always carry a quick-source of sugar with you in case you have symptoms of low blood sugar. Examples include hard sugar candy or glucose tablets. Make sure others know that you can choke if you eat or drink when you develop serious symptoms of low blood sugar, such as seizures or unconsciousness. They must get medical help at once. Tell your doctor or health care professional if you have high blood sugar. You might need to change the dose of your medicine. If you are sick or exercising more than usual, you might need to change the dose of your medicine. Do not skip meals. Ask your doctor or health care professional if you should avoid alcohol. Many nonprescription cough and cold products contain sugar or alcohol. These can affect blood sugar. Pens should never be shared. Even if the needle is changed, sharing may result in passing of viruses like hepatitis or HIV. Wear a medical ID bracelet or chain, and carry a card that describes your disease and details of your medicine and dosage times. What side effects may I notice from receiving this medicine? Side effects  that you should report to your doctor or health care professional as soon as possible: -allergic reactions like skin rash, itching or hives, swelling of the face, lips, or tongue -breathing problems -diarrhea that continues or is severe -lump or swelling on the neck -severe nausea -signs and symptoms of infection like fever or chills; cough; sore throat; pain or trouble passing urine -signs and symptoms of low blood sugar such as feeling anxious, confusion, dizziness, increased hunger, unusually weak or tired, sweating, shakiness, cold, irritable, headache, blurred vision, fast heartbeat, loss of consciousness -signs and symptoms of kidney injury like trouble passing urine or change in the amount of urine -trouble swallowing -unusual stomach upset or pain -vomiting Side effects that usually do not require medical attention (report to your doctor or health care professional if they continue or are bothersome): -constipation -decreased appetite -diarrhea -fatigue -headache -nausea -pain, redness, or irritation at site where injected -stomach upset -stuffy or runny nose This list may not describe all possible side effects. Call your doctor for medical advice about side effects. You may report side effects to FDA at 1-800-FDA-1088. Where should I keep my medicine? Keep out of the reach of children. Store unopened pen in a refrigerator between 2 and 8 degrees C (36 and 46 degrees F). Do not freeze or use if the medicine has been frozen. Protect from light and excessive heat. After you first use the pen, it can be stored at room temperature between 15 and 30 degrees C (59 and   86 degrees F) or in a refrigerator. Throw away your used pen after 30 days or after the expiration date, whichever comes first. Do not store your pen with the needle attached. If the needle is left on, medicine may leak from the pen. NOTE: This sheet is a summary. It may not cover all possible information. If you have  questions about this medicine, talk to your doctor, pharmacist, or health care provider.  2018 Elsevier/Gold Standard (2016-04-03 14:39:40)  

## 2018-01-19 NOTE — Progress Notes (Signed)
Subjective:  Patient ID: Dave Arnold, male    DOB: Feb 09, 1971  Age: 47 y.o. MRN: [id number]  CC: Diabetes   HPI Dave Arnold  is a 47 year old male with a history of type 2 diabetes mellitus (A1c 10.1), hypertension, hyperlipidemia, CHF (EF 50-55%), obesity who comes into the clinic for a follow-up visit. His blood pressure is elevated and he informs me he ran out of spironolactone. He claims compliance with his insulin however his A1c is 10.1 today.  Denies visual complaints, neuropathy, hypoglycemia. Nonadherent with a low-sodium, diabetic diet or exercise. Denies chest pains, shortness of breath or pedal edema.  He has noticed intermittent burning in his penis which is absent at this time; he is not circumcised.  Denies multiple sexual partners, dysuria, penile discharge. He does have right-sided thoracic back pain which is also intermittent and he has used Flexeril in the past with relief of symptoms.  Denies heavy lifting and pain is described as moderate at this time.  Past Medical History:  Diagnosis Date  . Diabetes mellitus without complication (HCC)   . Heart failure of unknown type (HCC) 06/2011  . Hypertension   . Obesity   . Thrombocytopenia (HCC)     History reviewed. No pertinent surgical history.  Allergies  Allergen Reactions  . Lisinopril     cough    Outpatient Medications Prior to Visit  Medication Sig Dispense Refill  . aspirin EC 81 MG tablet Take 81 mg by mouth daily.     Marland Kitchen EPINEPHrine (EPIPEN 2-PAK) 0.3 mg/0.3 mL IJ SOAJ injection Inject 0.3 mLs (0.3 mg total) into the muscle once. 1 Device 0  . glipiZIDE (GLUCOTROL) 10 MG tablet TAKE 1 TABLET BY MOUTH 2 TIMES DAILY BEFORE A MEAL. 180 tablet 1  . Insulin Syringe-Needle U-100 (BD INSULIN SYRINGE ULTRAFINE) 31G X 15/64" 0.5 ML MISC 1 each by Does not apply route 2 (two) times daily. 100 each 5  . Omega-3 Fatty Acids (FISH OIL) 1000 MG CAPS Take 3 capsules (3,000 mg total) by mouth daily. 100  capsule 5  . TRUEPLUS PEN NEEDLES 31G X 5 MM MISC USE AS DIRECTED TWICE DAILY. 100 each 0  . atorvastatin (LIPITOR) 20 MG tablet TAKE 1 TABLET BY MOUTH DAILY. 30 tablet 1  . carvedilol (COREG) 25 MG tablet TAKE 1 TABLET BY MOUTH 2 TIMES DAILY WITH A MEAL. 180 tablet 1  . cyclobenzaprine (FLEXERIL) 10 MG tablet TAKE 1 TABLET BY MOUTH TWICE DAILY AS NEEDED FOR MUSCLE SPASMS. 60 tablet 1  . Insulin Isophane & Regular Human (HUMULIN 70/30 KWIKPEN) (70-30) 100 UNIT/ML PEN Inject 40 Units into the skin 2 (two) times daily. 60 mL 0  . losartan (COZAAR) 100 MG tablet TAKE 1 TABLET BY MOUTH DAILY. 30 tablet 2  . spironolactone (ALDACTONE) 25 MG tablet Take 1 tablet (25 mg total) by mouth daily. 90 tablet 1  . ibuprofen (ADVIL,MOTRIN) 600 MG tablet Take 1 tablet (600 mg total) by mouth every 6 (six) hours as needed. (Patient not taking: Reported on 01/19/2018) 30 tablet 0   No facility-administered medications prior to visit.     ROS Review of Systems  Constitutional: Negative for activity change and appetite change.  HENT: Negative for sinus pressure and sore throat.   Eyes: Negative for visual disturbance.  Respiratory: Negative for cough, chest tightness and shortness of breath.   Cardiovascular: Negative for chest pain and leg swelling.  Gastrointestinal: Negative for abdominal distention, abdominal pain, constipation and diarrhea.  Endocrine:  Negative.   Genitourinary: Negative for dysuria.  Musculoskeletal: Positive for back pain. Negative for joint swelling and myalgias.  Skin: Negative for rash.  Allergic/Immunologic: Negative.   Neurological: Negative for weakness, light-headedness and numbness.  Psychiatric/Behavioral: Negative for dysphoric mood and suicidal ideas.    Objective:  BP (!) 165/91   Pulse 63   Temp 98.3 F (36.8 C) (Oral)   Ht 6\' 1"  (1.854 m)   Wt 286 lb 3.2 oz (129.8 kg)   SpO2 96%   BMI 37.76 kg/m   BP/Weight 01/19/2018 10/21/2017 03/16/2017  Systolic BP 165  962 148  Diastolic BP 91 71 108  Wt. (Lbs) 286.2 290 291  BMI 37.76 38.26 39.47      Physical Exam  Constitutional: He is oriented to person, place, and time. He appears well-developed and well-nourished.  Cardiovascular: Normal rate, normal heart sounds and intact distal pulses.  No murmur heard. Pulmonary/Chest: Effort normal and breath sounds normal. He has no wheezes. He has no rales. He exhibits no tenderness.  Abdominal: Soft. Bowel sounds are normal. He exhibits no distension and no mass. There is no tenderness.  Musculoskeletal: Normal range of motion.  No tenderness on palpation of back Negative straight leg raise bilaterally  Neurological: He is alert and oriented to person, place, and time.  Skin: Skin is warm and dry.  Psychiatric: He has a normal mood and affect.      CMP Latest Ref Rng & Units 10/21/2017 05/27/2016 10/09/2015  Glucose 65 - 99 mg/dL 952(W) 413(K) 440(N)  BUN 6 - 24 mg/dL 9 8 12   Creatinine 0.76 - 1.27 mg/dL 0.27 2.53 6.64  Sodium 134 - 144 mmol/L 143 142 143  Potassium 3.5 - 5.2 mmol/L 4.0 3.4(L) 3.7  Chloride 96 - 106 mmol/L 101 105 106  CO2 20 - 29 mmol/L 25 27 25   Calcium 8.7 - 10.2 mg/dL 9.3 8.8 8.9  Total Protein 6.0 - 8.5 g/dL 7.2 6.8 6.4  Total Bilirubin 0.0 - 1.2 mg/dL 0.7 0.7 0.7  Alkaline Phos 39 - 117 IU/L 108 84 88  AST 0 - 40 IU/L 18 25 30   ALT 0 - 44 IU/L 18 27 24     Lipid Panel     Component Value Date/Time   CHOL 106 10/21/2017 1143   TRIG 246 (H) 10/21/2017 1143   HDL 24 (L) 10/21/2017 1143   CHOLHDL 4.4 10/21/2017 1143   CHOLHDL 5.6 (H) 05/27/2016 1024   VLDL 46 (H) 04/26/2015 1054   LDLCALC 33 10/21/2017 1143    Lab Results  Component Value Date   HGBA1C 10.1 (A) 01/19/2018     Assessment & Plan:   1. Type 2 diabetes mellitus without complication, with long-term current use of insulin (HCC) Uncontrolled with A1c of 10.1 Victoza added to regimen Clinical pharmacist called in for teaching on administration and  titration of Victoza dose Continue diabetic diet and lifestyle modifications - POCT glucose (manual entry) - POCT glycosylated hemoglobin (Hb A1C) - liraglutide (VICTOZA) 18 MG/3ML SOPN; Inject subcutaneously daily 0.1 mL daily for 1 week then 0.2 mL daily for 1 week then 0.3 mLs thereafter  Dispense: 3 pen; Refill: 3 - cyclobenzaprine (FLEXERIL) 10 MG tablet; TAKE 1 TABLET BY MOUTH TWICE DAILY AS NEEDED FOR MUSCLE SPASMS.  Dispense: 60 tablet; Refill: 1 - losartan (COZAAR) 100 MG tablet; Take 1 tablet (100 mg total) by mouth daily.  Dispense: 90 tablet; Refill: 1  2. Pure hypercholesterolemia Controlled Low-cholesterol diet - atorvastatin (LIPITOR) 20 MG tablet;  Take 1 tablet (20 mg total) by mouth daily.  Dispense: 90 tablet; Refill: 1 - cyclobenzaprine (FLEXERIL) 10 MG tablet; TAKE 1 TABLET BY MOUTH TWICE DAILY AS NEEDED FOR MUSCLE SPASMS.  Dispense: 60 tablet; Refill: 1 - losartan (COZAAR) 100 MG tablet; Take 1 tablet (100 mg total) by mouth daily.  Dispense: 90 tablet; Refill: 1 - spironolactone (ALDACTONE) 25 MG tablet; Take 1 tablet (25 mg total) by mouth daily.  Dispense: 90 tablet; Refill: 1  3. Essential hypertension Uncontrolled due to running of the Spironolactone which have refilled Counseled on blood pressure goal of less than 130/80, low-sodium, DASH diet, medication compliance, 150 minutes of moderate intensity exercise per week. Discussed medication compliance, adverse effects. - atorvastatin (LIPITOR) 20 MG tablet; Take 1 tablet (20 mg total) by mouth daily.  Dispense: 90 tablet; Refill: 1 - carvedilol (COREG) 25 MG tablet; TAKE 1 TABLET BY MOUTH 2 TIMES DAILY WITH A MEAL.  Dispense: 180 tablet; Refill: 1 - cyclobenzaprine (FLEXERIL) 10 MG tablet; TAKE 1 TABLET BY MOUTH TWICE DAILY AS NEEDED FOR MUSCLE SPASMS.  Dispense: 60 tablet; Refill: 1 - Insulin Isophane & Regular Human (HUMULIN 70/30 KWIKPEN) (70-30) 100 UNIT/ML PEN; Inject 40 Units into the skin 2 (two) times daily.   Dispense: 60 mL; Refill: 6 - losartan (COZAAR) 100 MG tablet; Take 1 tablet (100 mg total) by mouth daily.  Dispense: 90 tablet; Refill: 1 - spironolactone (ALDACTONE) 25 MG tablet; Take 1 tablet (25 mg total) by mouth daily.  Dispense: 90 tablet; Refill: 1  4. Acute right-sided thoracic back pain - cyclobenzaprine (FLEXERIL) 10 MG tablet; TAKE 1 TABLET BY MOUTH TWICE DAILY AS NEEDED FOR MUSCLE SPASMS.  Dispense: 60 tablet; Refill: 1  5. Chronic systolic congestive heart failure (HCC) Euvolemic - cyclobenzaprine (FLEXERIL) 10 MG tablet; TAKE 1 TABLET BY MOUTH TWICE DAILY AS NEEDED FOR MUSCLE SPASMS.  Dispense: 60 tablet; Refill: 1 - losartan (COZAAR) 100 MG tablet; Take 1 tablet (100 mg total) by mouth daily.  Dispense: 90 tablet; Refill: 1 - spironolactone (ALDACTONE) 25 MG tablet; Take 1 tablet (25 mg total) by mouth daily.  Dispense: 90 tablet; Refill: 1  6. Balanitis Discussed hygiene in the setting of his not being circumcision - clotrimazole (LOTRIMIN) 1 % cream; Apply 1 application topically 2 (two) times daily.  Dispense: 30 g; Refill: 0   Meds ordered this encounter  Medications  . atorvastatin (LIPITOR) 20 MG tablet    Sig: Take 1 tablet (20 mg total) by mouth daily.    Dispense:  90 tablet    Refill:  1    90 day supply  . liraglutide (VICTOZA) 18 MG/3ML SOPN    Sig: Inject subcutaneously daily 0.1 mL daily for 1 week then 0.2 mL daily for 1 week then 0.3 mLs thereafter    Dispense:  3 pen    Refill:  3  . clotrimazole (LOTRIMIN) 1 % cream    Sig: Apply 1 application topically 2 (two) times daily.    Dispense:  30 g    Refill:  0  . carvedilol (COREG) 25 MG tablet    Sig: TAKE 1 TABLET BY MOUTH 2 TIMES DAILY WITH A MEAL.    Dispense:  180 tablet    Refill:  1    90-day supply  . cyclobenzaprine (FLEXERIL) 10 MG tablet    Sig: TAKE 1 TABLET BY MOUTH TWICE DAILY AS NEEDED FOR MUSCLE SPASMS.    Dispense:  60 tablet    Refill:  1  .  Insulin Isophane & Regular Human  (HUMULIN 70/30 KWIKPEN) (70-30) 100 UNIT/ML PEN    Sig: Inject 40 Units into the skin 2 (two) times daily.    Dispense:  60 mL    Refill:  6  . losartan (COZAAR) 100 MG tablet    Sig: Take 1 tablet (100 mg total) by mouth daily.    Dispense:  90 tablet    Refill:  1    90-day supply  . spironolactone (ALDACTONE) 25 MG tablet    Sig: Take 1 tablet (25 mg total) by mouth daily.    Dispense:  90 tablet    Refill:  1    90-day supply    Follow-up: Return in about 3 months (around 04/21/2018) for Follow-up of chronic medical conditions.   [name] MD

## 2018-01-19 NOTE — Progress Notes (Signed)
Patient has been having pain in lower back.  Patient eats at times and has to go right to the bathroom.

## 2018-01-28 ENCOUNTER — Ambulatory Visit: Payer: Self-pay | Admitting: Pharmacist

## 2018-02-16 ENCOUNTER — Other Ambulatory Visit: Payer: Self-pay | Admitting: Family Medicine

## 2018-02-16 DIAGNOSIS — Z794 Long term (current) use of insulin: Secondary | ICD-10-CM

## 2018-02-16 DIAGNOSIS — E7800 Pure hypercholesterolemia, unspecified: Secondary | ICD-10-CM

## 2018-02-16 DIAGNOSIS — I5022 Chronic systolic (congestive) heart failure: Secondary | ICD-10-CM

## 2018-02-16 DIAGNOSIS — E119 Type 2 diabetes mellitus without complications: Secondary | ICD-10-CM

## 2018-02-16 DIAGNOSIS — I10 Essential (primary) hypertension: Secondary | ICD-10-CM

## 2018-02-16 MED FILL — LOSARTAN POTASSIUM 100 MG T: 100 | 90 days supply | Qty: 90 | Fill #0

## 2018-02-16 MED FILL — glipiZIDE 10 MG TABS: 10 | 90 days supply | Qty: 180 | Fill #1

## 2018-02-16 MED FILL — VICTOZA 18 MG/3 ML INJECT P: 18 | 14 days supply | Qty: 3 | Fill #1

## 2018-02-16 MED FILL — ATORVASTATIN 20 MG TABLET: 20 | 90 days supply | Qty: 90 | Fill #0

## 2018-02-16 MED FILL — CARVEDILOL 25 MG TABLET: 25 | 90 days supply | Qty: 180 | Fill #1

## 2018-02-17 MED FILL — $HUMULIN 70/30 KWIKPEN: (70-30) 100 | 93 days supply | Qty: 75 | Fill #0

## 2018-03-15 MED FILL — VICTOZA 18 MG/3 ML INJECT P: 18 | 14 days supply | Qty: 9 | Fill #0

## 2018-04-21 ENCOUNTER — Encounter: Payer: Self-pay | Admitting: Family Medicine

## 2018-04-21 ENCOUNTER — Ambulatory Visit: Payer: Self-pay | Admitting: Family Medicine

## 2018-04-21 ENCOUNTER — Ambulatory Visit: Payer: Self-pay | Attending: Family Medicine | Admitting: Family Medicine

## 2018-04-21 VITALS — BP 161/95 | HR 71 | Temp 98.2°F | Ht 73.0 in | Wt 280.0 lb

## 2018-04-21 DIAGNOSIS — E0865 Diabetes mellitus due to underlying condition with hyperglycemia: Secondary | ICD-10-CM

## 2018-04-21 DIAGNOSIS — E785 Hyperlipidemia, unspecified: Secondary | ICD-10-CM | POA: Insufficient documentation

## 2018-04-21 DIAGNOSIS — G8929 Other chronic pain: Secondary | ICD-10-CM

## 2018-04-21 DIAGNOSIS — E7800 Pure hypercholesterolemia, unspecified: Secondary | ICD-10-CM

## 2018-04-21 DIAGNOSIS — E1165 Type 2 diabetes mellitus with hyperglycemia: Secondary | ICD-10-CM

## 2018-04-21 DIAGNOSIS — Z7982 Long term (current) use of aspirin: Secondary | ICD-10-CM | POA: Insufficient documentation

## 2018-04-21 DIAGNOSIS — Z6836 Body mass index (BMI) 36.0-36.9, adult: Secondary | ICD-10-CM | POA: Insufficient documentation

## 2018-04-21 DIAGNOSIS — Z79899 Other long term (current) drug therapy: Secondary | ICD-10-CM | POA: Insufficient documentation

## 2018-04-21 DIAGNOSIS — I110 Hypertensive heart disease with heart failure: Secondary | ICD-10-CM | POA: Insufficient documentation

## 2018-04-21 DIAGNOSIS — I5022 Chronic systolic (congestive) heart failure: Secondary | ICD-10-CM

## 2018-04-21 DIAGNOSIS — E119 Type 2 diabetes mellitus without complications: Secondary | ICD-10-CM

## 2018-04-21 DIAGNOSIS — Z794 Long term (current) use of insulin: Secondary | ICD-10-CM | POA: Insufficient documentation

## 2018-04-21 DIAGNOSIS — E669 Obesity, unspecified: Secondary | ICD-10-CM | POA: Insufficient documentation

## 2018-04-21 DIAGNOSIS — M546 Pain in thoracic spine: Secondary | ICD-10-CM | POA: Insufficient documentation

## 2018-04-21 DIAGNOSIS — I10 Essential (primary) hypertension: Secondary | ICD-10-CM

## 2018-04-21 DIAGNOSIS — Z888 Allergy status to other drugs, medicaments and biological substances status: Secondary | ICD-10-CM | POA: Insufficient documentation

## 2018-04-21 LAB — GLUCOSE, POCT (MANUAL RESULT ENTRY): POC Glucose: 206 mg/dl — AB (ref 70–99)

## 2018-04-21 MED ORDER — CARVEDILOL 25 MG PO TABS: ORAL_TABLET | ORAL | 1 refills | Status: DC

## 2018-04-21 MED ORDER — SPIRONOLACTONE 25 MG PO TABS: 25.0000 mg | ORAL_TABLET | Freq: Every day | ORAL | 1 refills | Status: DC

## 2018-04-21 MED ORDER — GLIPIZIDE 10 MG PO TABS: ORAL_TABLET | ORAL | 1 refills | Status: DC

## 2018-04-21 MED ORDER — LOSARTAN POTASSIUM 100 MG PO TABS: 100.0000 mg | ORAL_TABLET | Freq: Every day | ORAL | 1 refills | Status: DC

## 2018-04-21 MED ORDER — LIRAGLUTIDE 18 MG/3ML ~~LOC~~ SOPN: PEN_INJECTOR | SUBCUTANEOUS | 3 refills | Status: DC

## 2018-04-21 MED ORDER — AMLODIPINE BESYLATE 5 MG PO TABS: 5.0000 mg | ORAL_TABLET | Freq: Every day | ORAL | 3 refills | Status: DC

## 2018-04-21 MED ORDER — ATORVASTATIN CALCIUM 20 MG PO TABS: 20.0000 mg | ORAL_TABLET | Freq: Every day | ORAL | 1 refills | Status: DC

## 2018-04-21 MED ORDER — CYCLOBENZAPRINE HCL 10 MG PO TABS: ORAL_TABLET | ORAL | 1 refills | Status: DC

## 2018-04-21 NOTE — Progress Notes (Signed)
Subjective:  Patient ID: Dave Arnold, male    DOB: 10-Jan-1971  Age: 48 y.o. MRN: [id number]  CC: Diabetes   HPI Dave Arnold is a 48 year old male with a history of type 2 diabetes mellitus (A1c 10.1), hypertension, hyperlipidemia, CHF (EF 50-55%), obesity who comes into the clinic for a follow-up visit. His blood pressure is elevated and he is compliant with his antihypertensives and denies adverse effects. Also compliant with his statin and a low-cholesterol diet.  He has lost weight since commencing Victoza for diabetes. He claims compliance with his insulin however his A1c is 10.1 today.  Denies visual complaints, neuropathy, hypoglycemia.  Fasting sugars range between 160 and 200. Currently takes Flexeril for intermittent thoracolumbar pain. He denies additional concerns at this time.  Past Medical History:  Diagnosis Date  . Diabetes mellitus without complication (HCC)   . Heart failure of unknown type (HCC) 06/2011  . Hypertension   . Obesity   . Thrombocytopenia (HCC)     History reviewed. No pertinent surgical history.  Allergies  Allergen Reactions  . Lisinopril     cough     Outpatient Medications Prior to Visit  Medication Sig Dispense Refill  . aspirin EC 81 MG tablet Take 81 mg by mouth daily.     . clotrimazole (LOTRIMIN) 1 % cream Apply 1 application topically 2 (two) times daily. 30 g 0  . EPINEPHrine (EPIPEN 2-PAK) 0.3 mg/0.3 mL IJ SOAJ injection Inject 0.3 mLs (0.3 mg total) into the muscle once. 1 Device 0  . Insulin Isophane & Regular Human (HUMULIN 70/30 KWIKPEN) (70-30) 100 UNIT/ML PEN Inject 40 Units into the skin 2 (two) times daily. 60 mL 6  . Insulin Syringe-Needle U-100 (BD INSULIN SYRINGE ULTRAFINE) 31G X 15/64" 0.5 ML MISC 1 each by Does not apply route 2 (two) times daily. 100 each 5  . Omega-3 Fatty Acids (FISH OIL) 1000 MG CAPS Take 3 capsules (3,000 mg total) by mouth daily. 100 capsule 5  . TRUEPLUS PEN NEEDLES 31G X 5 MM MISC  USE AS DIRECTED TWICE DAILY. 100 each 2  . atorvastatin (LIPITOR) 20 MG tablet Take 1 tablet (20 mg total) by mouth daily. 90 tablet 1  . carvedilol (COREG) 25 MG tablet TAKE 1 TABLET BY MOUTH 2 TIMES DAILY WITH A MEAL. 180 tablet 1  . cyclobenzaprine (FLEXERIL) 10 MG tablet TAKE 1 TABLET BY MOUTH TWICE DAILY AS NEEDED FOR MUSCLE SPASMS. 60 tablet 1  . glipiZIDE (GLUCOTROL) 10 MG tablet TAKE 1 TABLET BY MOUTH 2 TIMES DAILY BEFORE A MEAL. 180 tablet 1  . liraglutide (VICTOZA) 18 MG/3ML SOPN Inject subcutaneously daily 0.1 mL daily for 1 week then 0.2 mL daily for 1 week then 0.3 mLs thereafter 3 pen 3  . losartan (COZAAR) 100 MG tablet Take 1 tablet (100 mg total) by mouth daily. 90 tablet 1  . spironolactone (ALDACTONE) 25 MG tablet Take 1 tablet (25 mg total) by mouth daily. 90 tablet 1  . ibuprofen (ADVIL,MOTRIN) 600 MG tablet Take 1 tablet (600 mg total) by mouth every 6 (six) hours as needed. (Patient not taking: Reported on 01/19/2018) 30 tablet 0   No facility-administered medications prior to visit.     ROS Review of Systems  Constitutional: Negative for activity change and appetite change.  HENT: Negative for sinus pressure and sore throat.   Eyes: Negative for visual disturbance.  Respiratory: Negative for cough, chest tightness and shortness of breath.   Cardiovascular: Negative for chest  pain and leg swelling.  Gastrointestinal: Negative for abdominal distention, abdominal pain, constipation and diarrhea.  Endocrine: Negative.   Genitourinary: Negative for dysuria.  Musculoskeletal: Negative for joint swelling and myalgias.  Skin: Negative for rash.  Allergic/Immunologic: Negative.   Neurological: Negative for weakness, light-headedness and numbness.  Psychiatric/Behavioral: Negative for dysphoric mood and suicidal ideas.    Objective:  BP (!) 161/95   Pulse 71   Temp 98.2 F (36.8 C) (Oral)   Ht 6\' 1"  (1.854 m)   Wt 280 lb (127 kg)   SpO2 98%   BMI 36.94 kg/m    BP/Weight 04/21/2018 01/19/2018 10/21/2017  Systolic BP 161 165 137  Diastolic BP 95 91 71  Wt. (Lbs) 280 286.2 290  BMI 36.94 37.76 38.26      Physical Exam Constitutional:      Appearance: He is well-developed.  Cardiovascular:     Rate and Rhythm: Normal rate.     Heart sounds: Normal heart sounds. No murmur.  Pulmonary:     Effort: Pulmonary effort is normal.     Breath sounds: Normal breath sounds. No wheezing or rales.  Chest:     Chest wall: No tenderness.  Abdominal:     General: Bowel sounds are normal. There is no distension.     Palpations: Abdomen is soft. There is no mass.     Tenderness: There is no abdominal tenderness.  Musculoskeletal: Normal range of motion.  Neurological:     Mental Status: He is alert and oriented to person, place, and time.      CMP Latest Ref Rng & Units 10/21/2017 05/27/2016 10/09/2015  Glucose 65 - 99 mg/dL 782(N257(H) 562(Z231(H) 308(M212(H)  BUN 6 - 24 mg/dL 9 8 12   Creatinine 0.76 - 1.27 mg/dL 5.780.87 4.690.84 6.290.84  Sodium 134 - 144 mmol/L 143 142 143  Potassium 3.5 - 5.2 mmol/L 4.0 3.4(L) 3.7  Chloride 96 - 106 mmol/L 101 105 106  CO2 20 - 29 mmol/L 25 27 25   Calcium 8.7 - 10.2 mg/dL 9.3 8.8 8.9  Total Protein 6.0 - 8.5 g/dL 7.2 6.8 6.4  Total Bilirubin 0.0 - 1.2 mg/dL 0.7 0.7 0.7  Alkaline Phos 39 - 117 IU/L 108 84 88  AST 0 - 40 IU/L 18 25 30   ALT 0 - 44 IU/L 18 27 24     Lipid Panel     Component Value Date/Time   CHOL 106 10/21/2017 1143   TRIG 246 (H) 10/21/2017 1143   HDL 24 (L) 10/21/2017 1143   CHOLHDL 4.4 10/21/2017 1143   CHOLHDL 5.6 (H) 05/27/2016 1024   VLDL 46 (H) 04/26/2015 1054   LDLCALC 33 10/21/2017 1143    Lab Results  Component Value Date   HGBA1C 10.1 (A) 01/19/2018    Assessment & Plan:   1. Diabetes mellitus due to underlying condition with hyperglycemia, with long-term current use of insulin (HCC) Uncontrolled with A1c of 10.1 A1c today and will adjust regimen accordingly Diabetic diet, lifestyle  modifications - POCT glucose (manual entry) - Hemoglobin A1c - glipiZIDE (GLUCOTROL) 10 MG tablet; TAKE 1 TABLET BY MOUTH 2 TIMES DAILY BEFORE A MEAL.  Dispense: 180 tablet; Refill: 1  2. Pure hypercholesterolemia Controlled Low-cholesterol diet - atorvastatin (LIPITOR) 20 MG tablet; Take 1 tablet (20 mg total) by mouth daily.  Dispense: 90 tablet; Refill: 1 - glipiZIDE (GLUCOTROL) 10 MG tablet; TAKE 1 TABLET BY MOUTH 2 TIMES DAILY BEFORE A MEAL.  Dispense: 180 tablet; Refill: 1  3. Essential hypertension Uncontrolled  Amlodipine added to regimen - Basic Metabolic Panel - carvedilol (COREG) 25 MG tablet; TAKE 1 TABLET BY MOUTH 2 TIMES DAILY WITH A MEAL.  Dispense: 180 tablet; Refill: 1 - glipiZIDE (GLUCOTROL) 10 MG tablet; TAKE 1 TABLET BY MOUTH 2 TIMES DAILY BEFORE A MEAL.  Dispense: 180 tablet; Refill: 1 - losartan (COZAAR) 100 MG tablet; Take 1 tablet (100 mg total) by mouth daily.  Dispense: 90 tablet; Refill: 1 - spironolactone (ALDACTONE) 25 MG tablet; Take 1 tablet (25 mg total) by mouth daily.  Dispense: 90 tablet; Refill: 1  4. Type 2 diabetes mellitus without complication, with long-term current use of insulin (HCC) See #1 above - liraglutide (VICTOZA) 18 MG/3ML SOPN; Inject subcutaneously daily 0.1 mL daily for 1 week then 0.2 mL daily for 1 week then 0.3 mLs thereafter  Dispense: 3 pen; Refill: 3  5. Chronic systolic congestive heart failure (HCC) Euvolemic EF 50 to 55% - spironolactone (ALDACTONE) 25 MG tablet; Take 1 tablet (25 mg total) by mouth daily.  Dispense: 90 tablet; Refill: 1  6. Chronic right-sided thoracic back pain Intermittent pain which is controlled on Flexeril - cyclobenzaprine (FLEXERIL) 10 MG tablet; TAKE 1 TABLET BY MOUTH TWICE DAILY AS NEEDED FOR MUSCLE SPASMS.  Dispense: 60 tablet; Refill: 1   Meds ordered this encounter  Medications  . amLODipine (NORVASC) 5 MG tablet    Sig: Take 1 tablet (5 mg total) by mouth daily.    Dispense:  90 tablet     Refill:  3  . atorvastatin (LIPITOR) 20 MG tablet    Sig: Take 1 tablet (20 mg total) by mouth daily.    Dispense:  90 tablet    Refill:  1    90 day supply  . carvedilol (COREG) 25 MG tablet    Sig: TAKE 1 TABLET BY MOUTH 2 TIMES DAILY WITH A MEAL.    Dispense:  180 tablet    Refill:  1    90-day supply  . glipiZIDE (GLUCOTROL) 10 MG tablet    Sig: TAKE 1 TABLET BY MOUTH 2 TIMES DAILY BEFORE A MEAL.    Dispense:  180 tablet    Refill:  1  . liraglutide (VICTOZA) 18 MG/3ML SOPN    Sig: Inject subcutaneously daily 0.1 mL daily for 1 week then 0.2 mL daily for 1 week then 0.3 mLs thereafter    Dispense:  3 pen    Refill:  3  . losartan (COZAAR) 100 MG tablet    Sig: Take 1 tablet (100 mg total) by mouth daily.    Dispense:  90 tablet    Refill:  1    90-day supply  . spironolactone (ALDACTONE) 25 MG tablet    Sig: Take 1 tablet (25 mg total) by mouth daily.    Dispense:  90 tablet    Refill:  1    90-day supply  . cyclobenzaprine (FLEXERIL) 10 MG tablet    Sig: TAKE 1 TABLET BY MOUTH TWICE DAILY AS NEEDED FOR MUSCLE SPASMS.    Dispense:  60 tablet    Refill:  1    Follow-up: Return in about 3 months (around 07/21/2018) for follow up of chronic medical conditions.   [name] MD

## 2018-04-22 ENCOUNTER — Encounter: Payer: Self-pay | Admitting: Family Medicine

## 2018-04-22 LAB — HEMOGLOBIN A1C
Est. average glucose Bld gHb Est-mCnc: 214 mg/dL
Hgb A1c MFr Bld: 9.1 % — ABNORMAL HIGH (ref 4.8–5.6)

## 2018-04-22 LAB — BASIC METABOLIC PANEL
BUN/Creatinine Ratio: 13 (ref 9–20)
BUN: 11 mg/dL (ref 6–24)
CALCIUM: 9.4 mg/dL (ref 8.7–10.2)
CHLORIDE: 103 mmol/L (ref 96–106)
CO2: 23 mmol/L (ref 20–29)
Creatinine, Ser: 0.82 mg/dL (ref 0.76–1.27)
GFR calc Af Amer: 123 mL/min/{1.73_m2} (ref >59)
GFR calc non Af Amer: 107 mL/min/{1.73_m2} (ref >59)
GLUCOSE: 173 mg/dL — AB (ref 65–99)
Potassium: 3.7 mmol/L (ref 3.5–5.2)
Sodium: 147 mmol/L — ABNORMAL HIGH (ref 134–144)

## 2018-04-22 MED ORDER — INSULIN ISOPHANE & REGULAR (HUMAN 70-30)100 UNIT/ML KWIKPEN: 45.0000 [IU] | PEN_INJECTOR | Freq: Two times a day (BID) | SUBCUTANEOUS | 6 refills | Status: DC

## 2018-04-22 MED FILL — CYCLOBENZAPRINE 10 MG TAB: 10 | 60 days supply | Qty: 120 | Fill #0

## 2018-04-22 MED FILL — VICTOZA 18 MG/3 ML INJECT P: 18 | 28 days supply | Qty: 9 | Fill #0

## 2018-04-22 MED FILL — SPIRONOLACTONE 25 MG TABLET: 25 | 90 days supply | Qty: 90 | Fill #0

## 2018-04-22 MED FILL — AMLODIPINE BESYLATE 5 MG TA: 5 | 90 days supply | Qty: 90 | Fill #0

## 2018-04-23 ENCOUNTER — Telehealth: Payer: Self-pay

## 2018-04-23 NOTE — Telephone Encounter (Signed)
Patient was called and informed of lab results. 

## 2018-04-23 NOTE — Telephone Encounter (Signed)
-----   Message from [name], MD sent at 04/22/2018  8:23 AM EST ----- A1c is elevated at 9.1.  I have increased his Humulin 70/30 to 45 units twice daily

## 2018-05-11 ENCOUNTER — Other Ambulatory Visit: Payer: Self-pay | Admitting: Physician Assistant

## 2018-05-11 MED FILL — OMEGA-3 ETHYL ESTERS 1 GM C: 1 | 50 days supply | Qty: 150 | Fill #3

## 2018-05-18 MED FILL — ATORVASTATIN 20 MG TABLET: 20 | 90 days supply | Qty: 90 | Fill #1

## 2018-05-18 MED FILL — LOSARTAN POTASSIUM 100 MG T: 100 | 90 days supply | Qty: 90 | Fill #1

## 2018-05-18 MED FILL — VICTOZA 18 MG/3 ML INJECT P: 18 | 28 days supply | Qty: 9 | Fill #1

## 2018-05-21 ENCOUNTER — Other Ambulatory Visit: Payer: Self-pay | Admitting: Family Medicine

## 2018-05-21 DIAGNOSIS — G8929 Other chronic pain: Secondary | ICD-10-CM

## 2018-05-21 DIAGNOSIS — M546 Pain in thoracic spine: Principal | ICD-10-CM

## 2018-06-01 ENCOUNTER — Other Ambulatory Visit: Payer: Self-pay | Admitting: Family Medicine

## 2018-06-01 DIAGNOSIS — M546 Pain in thoracic spine: Principal | ICD-10-CM

## 2018-06-01 DIAGNOSIS — G8929 Other chronic pain: Secondary | ICD-10-CM

## 2018-06-01 NOTE — Telephone Encounter (Signed)
Pt got a 60 day supply from the pharmacy on 04/23/18. He has enough medication to last until 06/22/18.

## 2018-06-01 NOTE — Telephone Encounter (Signed)
1) Medication(s) Requested (by name):cyclobenzaprine (FLEXERIL) 10 MG tablet [[id number]]    2) Pharmacy of Choice: [facility]   3) Special Requests:   Approved medications will be sent to the pharmacy, we will reach out if there is an issue.  Requests made after 3pm may not be addressed until the following business day!  If a patient is unsure of the name of the medication(s) please note and ask patient to call back when they are able to provide all info, do not send to responsible party until all information is available!

## 2018-06-02 NOTE — Telephone Encounter (Signed)
Called the patient and lvm for him to call the office back

## 2018-06-18 ENCOUNTER — Other Ambulatory Visit: Payer: Self-pay | Admitting: Family Medicine

## 2018-06-18 DIAGNOSIS — G8929 Other chronic pain: Secondary | ICD-10-CM

## 2018-06-18 DIAGNOSIS — M546 Pain in thoracic spine: Principal | ICD-10-CM

## 2018-06-22 MED FILL — CYCLOBENZAPRINE 10 MG TAB: 10 | 90 days supply | Qty: 180 | Fill #0

## 2018-06-25 MED FILL — glipiZIDE 10 MG TABS: 10 | 90 days supply | Qty: 180 | Fill #0

## 2018-06-25 MED FILL — CARVEDILOL 25 MG TABLET: 25 | 90 days supply | Qty: 180 | Fill #0

## 2018-06-25 MED FILL — VICTOZA 18 MG/3 ML INJECT P: 18 | 28 days supply | Qty: 9 | Fill #2

## 2018-07-15 ENCOUNTER — Other Ambulatory Visit: Payer: Self-pay | Admitting: Physician Assistant

## 2018-07-21 ENCOUNTER — Ambulatory Visit: Payer: Self-pay | Attending: Family Medicine | Admitting: Family Medicine

## 2018-07-21 ENCOUNTER — Other Ambulatory Visit: Payer: Self-pay

## 2018-07-26 MED FILL — AMLODIPINE BESYLATE 5 MG TA: 5 | 90 days supply | Qty: 90 | Fill #1

## 2018-07-26 MED FILL — SPIRONOLACTONE 25 MG TABLET: 25 | 90 days supply | Qty: 90 | Fill #1

## 2018-07-26 MED FILL — OMEGA-3 ETHYL ESTERS 1 GM C: 1 | 90 days supply | Qty: 270 | Fill #0

## 2018-07-28 MED FILL — !VICTOZA 18MG/3ML INJECT: 18 | 28 days supply | Qty: 9 | Fill #3

## 2018-07-30 ENCOUNTER — Other Ambulatory Visit: Payer: Self-pay | Admitting: Family Medicine

## 2018-07-30 DIAGNOSIS — E7800 Pure hypercholesterolemia, unspecified: Secondary | ICD-10-CM

## 2018-07-30 DIAGNOSIS — I10 Essential (primary) hypertension: Secondary | ICD-10-CM

## 2018-08-02 ENCOUNTER — Ambulatory Visit: Payer: Self-pay | Attending: Family Medicine | Admitting: Family Medicine

## 2018-08-02 ENCOUNTER — Encounter: Payer: Self-pay | Admitting: Family Medicine

## 2018-08-02 ENCOUNTER — Other Ambulatory Visit: Payer: Self-pay

## 2018-08-02 DIAGNOSIS — I5022 Chronic systolic (congestive) heart failure: Secondary | ICD-10-CM

## 2018-08-02 DIAGNOSIS — Z794 Long term (current) use of insulin: Secondary | ICD-10-CM

## 2018-08-02 DIAGNOSIS — K5909 Other constipation: Secondary | ICD-10-CM

## 2018-08-02 DIAGNOSIS — E7800 Pure hypercholesterolemia, unspecified: Secondary | ICD-10-CM

## 2018-08-02 DIAGNOSIS — I10 Essential (primary) hypertension: Secondary | ICD-10-CM

## 2018-08-02 DIAGNOSIS — E1169 Type 2 diabetes mellitus with other specified complication: Secondary | ICD-10-CM

## 2018-08-02 MED ORDER — AMLODIPINE BESYLATE 10 MG PO TABS: 10.0000 mg | ORAL_TABLET | Freq: Every day | ORAL | 1 refills | Status: DC

## 2018-08-02 MED ORDER — CARVEDILOL 25 MG PO TABS: ORAL_TABLET | ORAL | 1 refills | Status: DC

## 2018-08-02 MED ORDER — GLIPIZIDE 10 MG PO TABS: ORAL_TABLET | ORAL | 1 refills | Status: DC

## 2018-08-02 MED ORDER — LOSARTAN POTASSIUM 100 MG PO TABS: 100.0000 mg | ORAL_TABLET | Freq: Every day | ORAL | 1 refills | Status: DC

## 2018-08-02 MED ORDER — INSULIN ISOPHANE & REGULAR (HUMAN 70-30)100 UNIT/ML KWIKPEN: 50.0000 [IU] | PEN_INJECTOR | Freq: Two times a day (BID) | SUBCUTANEOUS | 6 refills | Status: DC

## 2018-08-02 MED ORDER — LIRAGLUTIDE 18 MG/3ML ~~LOC~~ SOPN: 1.8000 mg | PEN_INJECTOR | Freq: Every morning | SUBCUTANEOUS | 3 refills | Status: DC

## 2018-08-02 MED ORDER — SPIRONOLACTONE 25 MG PO TABS: 25.0000 mg | ORAL_TABLET | Freq: Every day | ORAL | 1 refills | Status: DC

## 2018-08-02 MED FILL — $HUMULIN 70/30 KWIKPEN: (70-30) 100 | 30 days supply | Qty: 30 | Fill #0

## 2018-08-02 MED FILL — ?AMLODIPINE BESYLATE 10 MG: 10 | 90 days supply | Qty: 90 | Fill #0

## 2018-08-02 MED FILL — ?ATORVASTATIN 20 MG TABLET: 20 | 90 days supply | Qty: 90 | Fill #0

## 2018-08-02 NOTE — Progress Notes (Signed)
Virtual Visit via Telephone Note  I connected with Dave PattyKevin R Seitzinger, on 08/02/2018 at 1:40 PM by telephone and verified that I am speaking with the correct person using two identifiers.   Consent: I discussed the limitations, risks, security and privacy concerns of performing an evaluation and management service by telephone and the availability of in person appointments. I also discussed with the patient that there may be a patient responsible charge related to this service. The patient expressed understanding and agreed to proceed.   Location of Patient: Home  Location of Provider: Clinic   Persons participating in Telemedicine visit: Laren EvertsKevin R Grinder  Alicia Farrington-CMA Dr. Nelwyn SalisburyNewlin-PCP     History of Present Illness: Dave Arnold is a 48 year old male with a history of type 2 diabetes mellitus (A1c 9.1), hypertension, hyperlipidemia, CHF (EF 50-55% from 06/2013), obesity who comes into the clinic for a follow-up visit.  At his last visit his blood pressure was 161/95 and amlodipine was added to his regimen.  He endorses compliance with his medications. He is also doing well on Victoza, Humulin 70/30 and his random sugars have been between 200-250 and lowest sugar was 140.  He denies hypoglycemia, numbness in extremities or blurry vision.  He complains of constipation which he describes as a little bit but not unbearable.  Previously moved his bowels regularly but now it occurs every 2 to 3 days.  Denies abdominal pain, nausea or vomiting.  He endorses not being as active and is more sedentary and thinks he has begun to gain weight. He denies dyspnea, chest pains, pedal edema.  Past Medical History:  Diagnosis Date  . Diabetes mellitus without complication (HCC)   . Heart failure of unknown type (HCC) 06/2011  . Hypertension   . Obesity   . Thrombocytopenia (HCC)    Allergies  Allergen Reactions  . Lisinopril     cough    Current Outpatient Medications on File  Prior to Visit  Medication Sig Dispense Refill  . amLODipine (NORVASC) 5 MG tablet Take 1 tablet (5 mg total) by mouth daily. 90 tablet 3  . aspirin EC 81 MG tablet Take 81 mg by mouth daily.     Marland Kitchen. atorvastatin (LIPITOR) 20 MG tablet TAKE 1 TABLET (20 MG TOTAL) BY MOUTH DAILY. 90 tablet 0  . carvedilol (COREG) 25 MG tablet TAKE 1 TABLET BY MOUTH 2 TIMES DAILY WITH A MEAL. 180 tablet 1  . cyclobenzaprine (FLEXERIL) 10 MG tablet TAKE 1 TABLET BY MOUTH TWICE DAILY AS NEEDED FOR MUSCLE SPASMS. 60 tablet 2  . EPINEPHrine (EPIPEN 2-PAK) 0.3 mg/0.3 mL IJ SOAJ injection Inject 0.3 mLs (0.3 mg total) into the muscle once. 1 Device 0  . glipiZIDE (GLUCOTROL) 10 MG tablet TAKE 1 TABLET BY MOUTH 2 TIMES DAILY BEFORE A MEAL. 180 tablet 1  . Insulin Isophane & Regular Human (HUMULIN 70/30 KWIKPEN) (70-30) 100 UNIT/ML PEN Inject 45 Units into the skin 2 (two) times daily. 60 mL 6  . Insulin Syringe-Needle U-100 (BD INSULIN SYRINGE ULTRAFINE) 31G X 15/64" 0.5 ML MISC 1 each by Does not apply route 2 (two) times daily. 100 each 5  . liraglutide (VICTOZA) 18 MG/3ML SOPN Inject subcutaneously daily 0.1 mL daily for 1 week then 0.2 mL daily for 1 week then 0.3 mLs thereafter 3 pen 3  . losartan (COZAAR) 100 MG tablet Take 1 tablet (100 mg total) by mouth daily. 90 tablet 1  . omega-3 acid ethyl esters (LOVAZA) 1 g capsule TAKE 3  CAPSULES BY MOUTH DAILY. 100 capsule 2  . Omega-3 Fatty Acids (FISH OIL) 1000 MG CAPS Take 3 capsules (3,000 mg total) by mouth daily. 100 capsule 5  . spironolactone (ALDACTONE) 25 MG tablet Take 1 tablet (25 mg total) by mouth daily. 90 tablet 1  . TRUEPLUS PEN NEEDLES 31G X 5 MM MISC USE AS DIRECTED TWICE DAILY. 100 each 2  . clotrimazole (LOTRIMIN) 1 % cream Apply 1 application topically 2 (two) times daily. (Patient not taking: Reported on 08/02/2018) 30 g 0  . ibuprofen (ADVIL,MOTRIN) 600 MG tablet Take 1 tablet (600 mg total) by mouth every 6 (six) hours as needed. (Patient not taking:  Reported on 01/19/2018) 30 tablet 0   No current facility-administered medications on file prior to visit.     Observations/Objective: Alert, awake, oriented x3 Not in acute distress   CMP Latest Ref Rng & Units 04/21/2018 10/21/2017 05/27/2016  Glucose 65 - 99 mg/dL 295(A) 213(Y) 865(H)  BUN 6 - 24 mg/dL Creatinine 0.76 - 1.27 mg/dL 8.46 9.62 9.52  Sodium 134 - 144 mmol/L 147(H) 143 142  Potassium 3.5 - 5.2 mmol/L 3.7 4.0 3.4(L)  Chloride 96 - 106 mmol/L 103 101 105  CO2 20 - 29 mmol/L Calcium 8.7 - 10.2 mg/dL 9.4 9.3 8.8  Total Protein 6.0 - 8.5 g/dL - 7.2 6.8  Total Bilirubin 0.0 - 1.2 mg/dL - 0.7 0.7  Alkaline Phos 39 - 117 IU/L - 108 84  AST 0 - 40 IU/L - 18 25  ALT 0 - 44 IU/L - 18 27    Lipid Panel     Component Value Date/Time   CHOL 106 10/21/2017 1143   TRIG 246 (H) 10/21/2017 1143   HDL 24 (L) 10/21/2017 1143   CHOLHDL 4.4 10/21/2017 1143   CHOLHDL 5.6 (H) 05/27/2016 1024   VLDL 46 (H) 04/26/2015 1054   LDLCALC 33 10/21/2017 1143   Lab Results  Component Value Date   HGBA1C 9.1 (H) 04/21/2018      Assessment and Plan: 1. Essential hypertension Uncontrolled from last office visit Increased amlodipine dose from 5 mg to 10 mg Counseled on blood pressure goal of less than 130/80, low-sodium, DASH diet, medication compliance, 150 minutes of moderate intensity exercise per week. Discussed medication compliance, adverse effects. - carvedilol (COREG) 25 MG tablet; TAKE 1 TABLET BY MOUTH 2 TIMES DAILY WITH A MEAL.  Dispense: 180 tablet; Refill: 1 - glipiZIDE (GLUCOTROL) 10 MG tablet; TAKE 1 TABLET BY MOUTH 2 TIMES DAILY BEFORE A MEAL.  Dispense: 180 tablet; Refill: 1 - losartan (COZAAR) 100 MG tablet; Take 1 tablet (100 mg total) by mouth daily.  Dispense: 90 tablet; Refill: 1 - spironolactone (ALDACTONE) 25 MG tablet; Take 1 tablet (25 mg total) by mouth daily.  Dispense: 90 tablet; Refill: 1  2. Pure hypercholesterolemia Normal total  cholesterol and LDL but elevated triglycerides Continue Lipitor Low-cholesterol diet  3. Type 2 diabetes mellitus with other specified complication, with long-term current use of insulin (HCC) Uncontrolled with A1c of 9.1 from 03/2018 Increase Humulin 70/30 to 50 units twice daily Counseled on Diabetic diet, my plate method, 841 minutes of moderate intensity exercise/week Keep blood sugar logs with fasting goals of 80-120 mg/dl, random of less than 324 and in the event of sugars less than 60 mg/dl or greater than 401 mg/dl please notify the clinic ASAP. It is recommended that you undergo annual eye exams and annual foot exams. Pneumonia vaccine  is recommended. - glipiZIDE (GLUCOTROL) 10 MG tablet; TAKE 1 TABLET BY MOUTH 2 TIMES DAILY BEFORE A MEAL.  Dispense: 180 tablet; Refill: 1 - liraglutide (VICTOZA) 18 MG/3ML SOPN; Inject 0.3 mLs (1.8 mg total) into the skin every morning.  Dispense: 3 pen; Refill: 3 - Insulin Isophane & Regular Human (HUMULIN 70/30 KWIKPEN) (70-30) 100 UNIT/ML PEN; Inject 50 Units into the skin 2 (two) times daily.  Dispense: 60 mL; Refill: 6  4. Chronic systolic congestive heart failure (HCC) Euvolemic Daily weights, continue beta-blocker, spironolactone, ARB (allergic to ACE inhibitor) - spironolactone (ALDACTONE) 25 MG tablet; Take 1 tablet (25 mg total) by mouth daily.  Dispense: 90 tablet; Refill: 1  5. Other constipation Amlodipine could be the culprit He declines substitution of amlodipine but would like to try lifestyle modifications which we have discussed including dietary increase of fiber, drinking water We will prescribe MiraLAX if still uncontrolled at next visit   Follow Up Instructions: Return in about 3 months (around 11/02/2018).    I discussed the assessment and treatment plan with the patient. The patient was provided an opportunity to ask questions and all were answered. The patient agreed with the plan and demonstrated an understanding of the  instructions.   The patient was advised to call back or seek an in-person evaluation if the symptoms worsen or if the condition fails to improve as anticipated.     I provided 20 minutes total of non-face-to-face time during this encounter including median intraservice time, reviewing previous notes, labs, imaging, medications and explaining diagnosis and management.     [name], MD, FAAFP. [facility] [place], [place] [phone]   08/02/2018, 1:40 PM

## 2018-08-02 NOTE — Progress Notes (Signed)
Patient has been called and DOB has been verified. Patient has been screened and transferred to PCP to start phone visit.  C/C: diabetes, constipation

## 2018-09-01 MED FILL — LOSARTAN POTASSIUM 100 MG T: 100 | 90 days supply | Qty: 90 | Fill #0

## 2018-09-02 MED FILL — !VICTOZA 18MG/3ML INJECT: 18 | 30 days supply | Qty: 9 | Fill #0

## 2018-09-27 ENCOUNTER — Other Ambulatory Visit: Payer: Self-pay | Admitting: Family Medicine

## 2018-09-27 DIAGNOSIS — G8929 Other chronic pain: Secondary | ICD-10-CM

## 2018-09-27 DIAGNOSIS — M546 Pain in thoracic spine: Secondary | ICD-10-CM

## 2018-09-28 MED FILL — CYCLOBENZAPRINE 10 MG TAB: 10 | 30 days supply | Qty: 60 | Fill #0

## 2018-09-30 ENCOUNTER — Other Ambulatory Visit: Payer: Self-pay

## 2018-09-30 DIAGNOSIS — E1169 Type 2 diabetes mellitus with other specified complication: Secondary | ICD-10-CM

## 2018-09-30 MED ORDER — HUMULIN 70/30 KWIKPEN (70-30) 100 UNIT/ML ~~LOC~~ SUPN: 50.0000 [IU] | PEN_INJECTOR | Freq: Two times a day (BID) | SUBCUTANEOUS | 0 refills | Status: DC

## 2018-09-30 NOTE — Telephone Encounter (Signed)
This patient was last seen on 08/02/18 and has no future appt scheduled.Please authorize fill for PASS if appropriate. Script will go to [facility].

## 2018-10-04 MED FILL — !VICTOZA 18MG/3ML INJECT: 18 | 30 days supply | Qty: 9 | Fill #1

## 2018-10-28 MED FILL — ?GLIPIZIDE 10 MG TABLET: 10 | 90 days supply | Qty: 180 | Fill #1

## 2018-10-28 MED FILL — OMEGA-3 ETHYL ESTERS 1 GM C: 1 | 90 days supply | Qty: 270 | Fill #0

## 2018-10-28 MED FILL — ?SPIRONOLACTON 25MG TABLET: 25 | 90 days supply | Qty: 90 | Fill #0

## 2018-10-28 MED FILL — ?ATORVASTATIN 20 MG TABLET: 20 | 90 days supply | Qty: 90 | Fill #0

## 2018-10-28 MED FILL — CARVEDILOL 25 MG TABLET: 25 | 90 days supply | Qty: 180 | Fill #1

## 2018-11-15 MED FILL — !VICTOZA 18MG/3ML INJECT: 18 | 30 days supply | Qty: 9 | Fill #2

## 2018-11-15 MED FILL — CYCLOBENZAPRINE 10 MG TAB: 10 | 30 days supply | Qty: 60 | Fill #1

## 2018-12-02 MED FILL — LOSARTAN POTASSIUM 100 MG T: 100 | 90 days supply | Qty: 90 | Fill #1

## 2018-12-02 MED FILL — HUMULIN 70/30 KWIKPEN: (70-30) 100 | 15 days supply | Qty: 15 | Fill #1

## 2018-12-21 ENCOUNTER — Other Ambulatory Visit: Payer: Self-pay

## 2018-12-21 DIAGNOSIS — Z794 Long term (current) use of insulin: Secondary | ICD-10-CM

## 2018-12-21 DIAGNOSIS — E1169 Type 2 diabetes mellitus with other specified complication: Secondary | ICD-10-CM

## 2018-12-21 MED ORDER — HUMULIN 70/30 KWIKPEN (70-30) 100 UNIT/ML ~~LOC~~ SUPN: 50.0000 [IU] | PEN_INJECTOR | Freq: Two times a day (BID) | SUBCUTANEOUS | 0 refills | Status: DC

## 2018-12-21 MED FILL — HUMULIN 70/30 KWIKPEN: (70-30) 100 | 15 days supply | Qty: 15 | Fill #2

## 2018-12-21 MED FILL — !VICTOZA 18MG/3ML INJECT: 18 | 30 days supply | Qty: 9 | Fill #3

## 2018-12-21 NOTE — Telephone Encounter (Signed)
Sent Rx to [facility] for pt's PASS refill-identical to request signed by Dr. Margarita Rana on 12/10/18

## 2019-01-19 ENCOUNTER — Other Ambulatory Visit: Payer: Self-pay | Admitting: Family Medicine

## 2019-01-19 DIAGNOSIS — Z794 Long term (current) use of insulin: Secondary | ICD-10-CM

## 2019-01-19 DIAGNOSIS — E1169 Type 2 diabetes mellitus with other specified complication: Secondary | ICD-10-CM

## 2019-01-19 MED FILL — AMLODIPINE BESYLATE 5 MG TA: 5 | 90 days supply | Qty: 90 | Fill #2

## 2019-01-19 MED FILL — SPIRONOLACTONE 25 MG TABLET: 25 | 90 days supply | Qty: 90 | Fill #1

## 2019-01-19 MED FILL — !VICTOZA 18MG/3ML INJECT: 18 | 30 days supply | Qty: 9 | Fill #0

## 2019-01-19 MED FILL — CYCLOBENZAPRINE 10 MG TAB: 10 | 30 days supply | Qty: 60 | Fill #2

## 2019-01-26 ENCOUNTER — Other Ambulatory Visit: Payer: Self-pay | Admitting: Family Medicine

## 2019-01-26 DIAGNOSIS — Z794 Long term (current) use of insulin: Secondary | ICD-10-CM

## 2019-01-26 DIAGNOSIS — I5022 Chronic systolic (congestive) heart failure: Secondary | ICD-10-CM

## 2019-01-26 DIAGNOSIS — I10 Essential (primary) hypertension: Secondary | ICD-10-CM

## 2019-01-26 DIAGNOSIS — E119 Type 2 diabetes mellitus without complications: Secondary | ICD-10-CM

## 2019-01-26 DIAGNOSIS — E7800 Pure hypercholesterolemia, unspecified: Secondary | ICD-10-CM

## 2019-01-26 MED FILL — TRUEPLUS 5-BEVEL PEN NEEDLE: 31G X 5 MM | 50 days supply | Qty: 100 | Fill #0

## 2019-01-27 ENCOUNTER — Ambulatory Visit: Payer: Self-pay | Admitting: Pharmacist

## 2019-01-28 ENCOUNTER — Ambulatory Visit: Payer: Self-pay | Admitting: Pharmacist

## 2019-02-03 ENCOUNTER — Other Ambulatory Visit: Payer: Self-pay | Admitting: Family Medicine

## 2019-02-03 MED FILL — ?ATORVASTATIN 20 MG TABLET: 20 | 90 days supply | Qty: 90 | Fill #1

## 2019-02-03 MED FILL — TRUEPLUS 5-BEVEL PEN NEEDLE: 31G X 5 MM | 50 days supply | Qty: 100 | Fill #0

## 2019-02-04 MED FILL — OMEGA-3 ETHYL ESTERS 1 GM C: 1 | 30 days supply | Qty: 90 | Fill #0

## 2019-02-09 ENCOUNTER — Ambulatory Visit: Payer: Self-pay | Admitting: Pharmacist

## 2019-02-28 ENCOUNTER — Other Ambulatory Visit: Payer: Self-pay | Admitting: Family Medicine

## 2019-02-28 MED FILL — LOSARTAN POTASSIUM 100 MG T: 100 | 90 days supply | Qty: 90 | Fill #0

## 2019-02-28 MED FILL — ?CARVEDILOL 25MG TABLE: 25 | 90 days supply | Qty: 180 | Fill #0

## 2019-02-28 MED FILL — $VICTOZA 2-PAK 18MG/3ML PEN: 18 | 90 days supply | Qty: 27 | Fill #1

## 2019-02-28 MED FILL — ?GLIPIZIDE 10 MG TABLET: 10 | 90 days supply | Qty: 180 | Fill #0

## 2019-03-07 ENCOUNTER — Other Ambulatory Visit: Payer: Self-pay | Admitting: Family Medicine

## 2019-03-07 ENCOUNTER — Other Ambulatory Visit: Payer: Self-pay

## 2019-03-07 ENCOUNTER — Encounter: Payer: Self-pay | Admitting: Family Medicine

## 2019-03-07 ENCOUNTER — Ambulatory Visit: Payer: Self-pay | Attending: Family Medicine | Admitting: Family Medicine

## 2019-03-07 VITALS — BP 131/71 | HR 67 | Temp 98.2°F | Ht 73.0 in | Wt 284.0 lb

## 2019-03-07 DIAGNOSIS — I5022 Chronic systolic (congestive) heart failure: Secondary | ICD-10-CM

## 2019-03-07 DIAGNOSIS — M25522 Pain in left elbow: Secondary | ICD-10-CM

## 2019-03-07 DIAGNOSIS — E7800 Pure hypercholesterolemia, unspecified: Secondary | ICD-10-CM

## 2019-03-07 DIAGNOSIS — I10 Essential (primary) hypertension: Secondary | ICD-10-CM

## 2019-03-07 DIAGNOSIS — G8929 Other chronic pain: Secondary | ICD-10-CM

## 2019-03-07 DIAGNOSIS — Z794 Long term (current) use of insulin: Secondary | ICD-10-CM

## 2019-03-07 DIAGNOSIS — E1169 Type 2 diabetes mellitus with other specified complication: Secondary | ICD-10-CM

## 2019-03-07 LAB — POCT GLYCOSYLATED HEMOGLOBIN (HGB A1C): HbA1c, POC (controlled diabetic range): 7.3 % — AB (ref 0.0–7.0)

## 2019-03-07 LAB — GLUCOSE, POCT (MANUAL RESULT ENTRY): POC Glucose: 194 mg/dl — AB (ref 70–99)

## 2019-03-07 MED ORDER — LOSARTAN POTASSIUM 100 MG PO TABS: 100.0000 mg | ORAL_TABLET | Freq: Every day | ORAL | 1 refills | Status: DC

## 2019-03-07 MED ORDER — VICTOZA 18 MG/3ML ~~LOC~~ SOPN: PEN_INJECTOR | SUBCUTANEOUS | 3 refills | Status: DC

## 2019-03-07 MED ORDER — GLIPIZIDE 10 MG PO TABS: ORAL_TABLET | ORAL | 1 refills | Status: DC

## 2019-03-07 MED ORDER — ATORVASTATIN CALCIUM 20 MG PO TABS: 20.0000 mg | ORAL_TABLET | Freq: Every day | ORAL | 1 refills | Status: DC

## 2019-03-07 MED ORDER — HUMULIN 70/30 KWIKPEN (70-30) 100 UNIT/ML ~~LOC~~ SUPN: 50.0000 [IU] | PEN_INJECTOR | Freq: Two times a day (BID) | SUBCUTANEOUS | 3 refills | Status: DC

## 2019-03-07 MED ORDER — AMLODIPINE BESYLATE 10 MG PO TABS: 10.0000 mg | ORAL_TABLET | Freq: Every day | ORAL | 1 refills | Status: DC

## 2019-03-07 MED ORDER — SPIRONOLACTONE 25 MG PO TABS: 25.0000 mg | ORAL_TABLET | Freq: Every day | ORAL | 1 refills | Status: DC

## 2019-03-07 MED ORDER — CARVEDILOL 25 MG PO TABS: ORAL_TABLET | ORAL | 1 refills | Status: DC

## 2019-03-07 NOTE — Telephone Encounter (Signed)
Please refill if appropriate

## 2019-03-07 NOTE — Progress Notes (Signed)
Subjective:  Patient ID: Dave Arnold, male    DOB: [birth year]  Age: 48 y.o. MRN: [id number]  CC: Diabetes   HPI Dave Arnold is a 48 year old male with a history of type 2 diabetes mellitus (A1c 7.3), hypertension, hyperlipidemia, CHF (EF 50-55% from 06/2013), obesity who comes into the clinic for a follow-up visit. His A1c is 7.3 which has improved from 9.1 previously and he endorses compliance with his medications and tries to exercise. Random sugars have been 190-230.  Wondering if he can receive a prescription for continuous glucose monitoring given if he will have to pay some amount out-of-pocket as he has no medical coverage. He sometimes has shooting pain in his left medial arm 2-3 times per day and is worse when he flexes his left arm and relieved when he lifts his left arm above his head symptoms improved. With regards to his CHF he denies dyspnea, pedal edema. Compliant with his statin and he has no additional concerns today.  Past Medical History:  Diagnosis Date   Diabetes mellitus without complication (HCC)    Heart failure of unknown type (HCC) 06/2011   Hypertension    Obesity    Thrombocytopenia (HCC)     No past surgical history on file.  Family History  Problem Relation Age of Onset   Heart disease Mother     Allergies  Allergen Reactions   Lisinopril     cough    Outpatient Medications Prior to Visit  Medication Sig Dispense Refill   amLODipine (NORVASC) 10 MG tablet Take 1 tablet (10 mg total) by mouth daily. 90 tablet 1   aspirin EC 81 MG tablet Take 81 mg by mouth daily.      atorvastatin (LIPITOR) 20 MG tablet TAKE 1 TABLET (20 MG TOTAL) BY MOUTH DAILY. 90 tablet 0   carvedilol (COREG) 25 MG tablet TAKE 1 TABLET BY MOUTH 2 TIMES DAILY WITH A MEAL. 180 tablet 1   cyclobenzaprine (FLEXERIL) 10 MG tablet TAKE 1 TABLET BY MOUTH TWICE DAILY AS NEEDED FOR MUSCLE SPASMS. 60 tablet 2   EPINEPHrine (EPIPEN 2-PAK) 0.3 mg/0.3 mL IJ  SOAJ injection Inject 0.3 mLs (0.3 mg total) into the muscle once. 1 Device 0   glipiZIDE (GLUCOTROL) 10 MG tablet TAKE 1 TABLET BY MOUTH 2 TIMES DAILY BEFORE A MEAL. 180 tablet 1   Insulin Isophane & Regular Human (HUMULIN 70/30 KWIKPEN) (70-30) 100 UNIT/ML PEN Inject 50 Units into the skin 2 (two) times daily. 120 mL 0   Insulin Syringe-Needle U-100 (BD INSULIN SYRINGE ULTRAFINE) 31G X 15/64" 0.5 ML MISC 1 each by Does not apply route 2 (two) times daily. 100 each 5   losartan (COZAAR) 100 MG tablet TAKE 1 TABLET (100 MG TOTAL) BY MOUTH DAILY. 90 tablet 1   omega-3 acid ethyl esters (LOVAZA) 1 g capsule Take 3 capsules (3 g total) by mouth daily. Must have office visit for refills 30 capsule 2   Omega-3 Fatty Acids (FISH OIL) 1000 MG CAPS Take 3 capsules (3,000 mg total) by mouth daily. 100 capsule 5   spironolactone (ALDACTONE) 25 MG tablet Take 1 tablet (25 mg total) by mouth daily. 90 tablet 1   TRUEPLUS PEN NEEDLES 31G X 5 MM MISC USE AS DIRECTED TWICE DAILY. 100 each 0   VICTOZA 18 MG/3ML SOPN INJECT 0.3 MLS (1.8 MG TOTAL) INTO THE SKIN EVERY MORNING. 9 mL 3   clotrimazole (LOTRIMIN) 1 % cream Apply 1 application topically 2 (two) times daily. (  Patient not taking: Reported on 08/02/2018) 30 g 0   ibuprofen (ADVIL,MOTRIN) 600 MG tablet Take 1 tablet (600 mg total) by mouth every 6 (six) hours as needed. (Patient not taking: Reported on 01/19/2018) 30 tablet 0   No facility-administered medications prior to visit.      ROS Review of Systems  Constitutional: Negative for activity change and appetite change.  HENT: Negative for sinus pressure and sore throat.   Eyes: Negative for visual disturbance.  Respiratory: Negative for cough, chest tightness and shortness of breath.   Cardiovascular: Negative for chest pain and leg swelling.  Gastrointestinal: Negative for abdominal distention, abdominal pain, constipation and diarrhea.  Endocrine: Negative.   Genitourinary: Negative for  dysuria.  Musculoskeletal:       See HPI  Skin: Negative for rash.  Allergic/Immunologic: Negative.   Neurological: Negative for weakness, light-headedness and numbness.  Psychiatric/Behavioral: Negative for dysphoric mood and suicidal ideas.    Objective:  BP 131/71    Pulse 67    Temp 98.2 F (36.8 C) (Oral)    Ht 6\' 1"  (1.854 m)    Wt 284 lb (128.8 kg)    SpO2 97%    BMI 37.47 kg/m   BP/Weight 03/07/2019 04/21/2018 01/19/2018  Systolic BP 131 161 165  Diastolic BP 71 95 91  Wt. (Lbs) 284 280 286.2  BMI 37.47 36.94 37.76      Physical Exam Constitutional:      Appearance: He is well-developed.  Neck:     Vascular: No JVD.  Cardiovascular:     Rate and Rhythm: Normal rate.     Heart sounds: Normal heart sounds. No murmur.  Pulmonary:     Effort: Pulmonary effort is normal.     Breath sounds: Normal breath sounds. No wheezing or rales.  Chest:     Chest wall: No tenderness.  Abdominal:     General: Bowel sounds are normal. There is no distension.     Palpations: Abdomen is soft. There is no mass.     Tenderness: There is no abdominal tenderness.  Musculoskeletal: Normal range of motion.        General: No tenderness.     Right lower leg: No edema.     Left lower leg: No edema.     Comments: Normal appearance of left elbow, no tenderness  Neurological:     Mental Status: He is alert and oriented to person, place, and time.  Psychiatric:        Mood and Affect: Mood normal.     CMP Latest Ref Rng & Units 04/21/2018 10/21/2017 05/27/2016  Glucose 65 - 99 mg/dL 05/29/2016) 161(W) 960(A)  BUN 6 - 24 mg/dL 11 9 8   Creatinine 0.76 - 1.27 mg/dL 540(J 8.11  Sodium 134 - 144 mmol/L 147(H) 143 142  Potassium 3.5 - 5.2 mmol/L 3.7 4.0 3.4(L)  Chloride 96 - 106 mmol/L 103 101 105  CO2 20 - 29 mmol/L 23 25 27   Calcium 8.7 - 10.2 mg/dL 9.4 9.3 8.8  Total Protein 6.0 - 8.5 g/dL - 7.2 6.8  Total Bilirubin 0.0 - 1.2 mg/dL - 0.7 0.7  Alkaline Phos 39 - 117 IU/L - 108 84  AST 0 -  40 IU/L - 18 25  ALT 0 - 44 IU/L - 18 27    Lipid Panel     Component Value Date/Time   CHOL 106 10/21/2017 1143   TRIG 246 (H) 10/21/2017 1143   HDL 24 (L) 10/21/2017 1143  CHOLHDL 4.4 10/21/2017 1143   CHOLHDL 5.6 (H) 05/27/2016 1024   VLDL 46 (H) 04/26/2015 1054   LDLCALC 33 10/21/2017 1143    CBC    Component Value Date/Time   WBC 13.7 (H) 12/05/2014 1520   RBC 5.50 12/05/2014 1520   HGB 16.9 12/05/2014 1520   HCT 48.2 12/05/2014 1520   PLT 148 (L) 12/05/2014 1520   MCV 87.6 12/05/2014 1520   MCH 30.7 12/05/2014 1520   MCHC 35.1 12/05/2014 1520   RDW 13.1 12/05/2014 1520   LYMPHSABS 2.3 12/05/2014 1520   MONOABS 1.2 (H) 12/05/2014 1520   EOSABS 0.1 12/05/2014 1520   BASOSABS 0.0 12/05/2014 1520    Lab Results  Component Value Date   HGBA1C 7.3 (A) 03/07/2019     Assessment & Plan:   1. Type 2 diabetes mellitus with other specified complication, with long-term current use of insulin (HCC) Controlled with A1c of 7.3; goal is less than 7 Commended on improvement Continue current regimen We will reach out to clinical pharmacist for options available for continuous glucose monitoring given he has no medical coverage Counseled on Diabetic diet, my plate method, 696 minutes of moderate intensity exercise/week Blood sugar logs with fasting goals of 80-120 mg/dl, random of less than 180 and in the event of sugars less than 60 mg/dl or greater than 400 mg/dl encouraged to notify the clinic. Advised on the need for annual eye exams, annual foot exams, Pneumonia vaccine. - Glucose (CBG) - HgB A1c - Microalbumin/Creatinine Ratio, Urine; Future - Lipid panel; Future - liraglutide (VICTOZA) 18 MG/3ML SOPN; INJECT 0.3 MLS (1.8 MG TOTAL) INTO THE SKIN EVERY MORNING.  Dispense: 9 mL; Refill: 3 - Insulin Isophane & Regular Human (HUMULIN 70/30 KWIKPEN) (70-30) 100 UNIT/ML PEN; Inject 50 Units into the skin 2 (two) times daily.  Dispense: 120 mL; Refill: 3 - glipiZIDE  (GLUCOTROL) 10 MG tablet; TAKE 1 TABLET BY MOUTH 2 TIMES DAILY BEFORE A MEAL.  Dispense: 180 tablet; Refill: 1  2. Chronic systolic congestive heart failure (HCC) EF 50 to 55% from 06/2013 Euvolemic Continue current regimen Consider referral for echo at next visit - spironolactone (ALDACTONE) 25 MG tablet; Take 1 tablet (25 mg total) by mouth daily.  Dispense: 90 tablet; Refill: 1  3. Essential hypertension Controlled Counseled on blood pressure goal of less than 130/80, low-sodium, DASH diet, medication compliance, 150 minutes of moderate intensity exercise per week. Discussed medication compliance, adverse effects. - Basic Metabolic Panel; Future - spironolactone (ALDACTONE) 25 MG tablet; Take 1 tablet (25 mg total) by mouth daily.  Dispense: 90 tablet; Refill: 1 - losartan (COZAAR) 100 MG tablet; Take 1 tablet (100 mg total) by mouth daily.  Dispense: 90 tablet; Refill: 1 - glipiZIDE (GLUCOTROL) 10 MG tablet; TAKE 1 TABLET BY MOUTH 2 TIMES DAILY BEFORE A MEAL.  Dispense: 180 tablet; Refill: 1 - carvedilol (COREG) 25 MG tablet; TAKE 1 TABLET BY MOUTH 2 TIMES DAILY WITH A MEAL.  Dispense: 180 tablet; Refill: 1 - amLODipine (NORVASC) 10 MG tablet; Take 1 tablet (10 mg total) by mouth daily.  Dispense: 90 tablet; Refill: 1  4. Pure hypercholesterolemia Due for lipid panel which I have ordered - glipiZIDE (GLUCOTROL) 10 MG tablet; TAKE 1 TABLET BY MOUTH 2 TIMES DAILY BEFORE A MEAL.  Dispense: 180 tablet; Refill: 1 - atorvastatin (LIPITOR) 20 MG tablet; Take 1 tablet (20 mg total) by mouth daily.  Dispense: 90 tablet; Refill: 1  5. Left elbow pain Advised to use NSAIDs as  needed Could be positional      Dave RegisterEnobong Rhyland Hinderliter, MD, FAAFP. [facility] [place], [place] [phone]   03/07/2019, 4:11 PM

## 2019-03-08 ENCOUNTER — Telehealth: Payer: Self-pay | Admitting: Family Medicine

## 2019-03-08 ENCOUNTER — Other Ambulatory Visit: Payer: Self-pay | Admitting: Family Medicine

## 2019-03-08 DIAGNOSIS — Z794 Long term (current) use of insulin: Secondary | ICD-10-CM

## 2019-03-08 DIAGNOSIS — I10 Essential (primary) hypertension: Secondary | ICD-10-CM

## 2019-03-08 MED ORDER — FREESTYLE LIBRE 14 DAY READER DEVI: 1.0000 | Freq: Three times a day (TID) | 0 refills | Status: AC

## 2019-03-08 MED ORDER — FREESTYLE LIBRE 14 DAY SENSOR MISC: 1.0000 | Freq: Three times a day (TID) | 6 refills | Status: DC

## 2019-03-08 MED FILL — CYCLOBENZAPRINE 10 MG TAB: 10 | 30 days supply | Qty: 60 | Fill #0

## 2019-03-08 NOTE — Addendum Note (Signed)
Addended by: Daisy Blossom, Annie Main L on: 03/08/2019 04:58 PM   Modules accepted: Orders

## 2019-03-08 NOTE — Telephone Encounter (Signed)
Dave Arnold, could you please address options for continuous glucose monitoring with this patient given he has no coverage but is willing to pay? Thank you

## 2019-03-08 NOTE — Telephone Encounter (Signed)
Called patient to discuss options. Without insurance, FreeStyle Libre sensors + reader is $100. Pt is willing to see what the cost is at [facility]. I have sent prescriptions in for him. If he cannot afford, he is willing to continue checking via fingerstick.

## 2019-03-09 ENCOUNTER — Other Ambulatory Visit: Payer: Self-pay

## 2019-03-09 ENCOUNTER — Ambulatory Visit: Payer: Self-pay | Attending: Family Medicine

## 2019-03-09 DIAGNOSIS — I10 Essential (primary) hypertension: Secondary | ICD-10-CM

## 2019-03-09 DIAGNOSIS — E1169 Type 2 diabetes mellitus with other specified complication: Secondary | ICD-10-CM

## 2019-03-09 DIAGNOSIS — Z794 Long term (current) use of insulin: Secondary | ICD-10-CM

## 2019-03-09 MED ORDER — FISH OIL 1000 MG PO CAPS: 3.0000 | ORAL_CAPSULE | Freq: Every day | ORAL | 5 refills | Status: DC

## 2019-03-09 MED FILL — OMEGA-3 ETHYL ESTERS 1 GM C: 1 | 30 days supply | Qty: 90 | Fill #0

## 2019-03-10 LAB — BASIC METABOLIC PANEL
BUN/Creatinine Ratio: 14 (ref 9–20)
BUN: 14 mg/dL (ref 6–24)
CO2: 25 mmol/L (ref 20–29)
Calcium: 9.7 mg/dL (ref 8.7–10.2)
Chloride: 103 mmol/L (ref 96–106)
Creatinine, Ser: 0.97 mg/dL (ref 0.76–1.27)
GFR calc Af Amer: 108 mL/min/{1.73_m2} (ref >59)
GFR calc non Af Amer: 93 mL/min/{1.73_m2} (ref >59)
Glucose: 172 mg/dL — ABNORMAL HIGH (ref 65–99)
Potassium: 3.8 mmol/L (ref 3.5–5.2)
Sodium: 143 mmol/L (ref 134–144)

## 2019-03-10 LAB — LIPID PANEL
Chol/HDL Ratio: 3.9 ratio (ref 0.0–5.0)
Cholesterol, Total: 109 mg/dL (ref 100–199)
HDL: 28 mg/dL — ABNORMAL LOW (ref >39)
LDL Chol Calc (NIH): 59 mg/dL (ref 0–99)
Triglycerides: 118 mg/dL (ref 0–149)
VLDL Cholesterol Cal: 22 mg/dL (ref 5–40)

## 2019-03-10 LAB — MICROALBUMIN / CREATININE URINE RATIO
Creatinine, Urine: 312.8 mg/dL
Microalb/Creat Ratio: 14 mg/g creat (ref 0–29)
Microalbumin, Urine: 42.3 ug/mL

## 2019-03-11 ENCOUNTER — Other Ambulatory Visit: Payer: Self-pay | Admitting: Pharmacist

## 2019-03-11 DIAGNOSIS — Z794 Long term (current) use of insulin: Secondary | ICD-10-CM

## 2019-03-11 MED ORDER — FREESTYLE LIBRE 14 DAY SENSOR MISC: 1.0000 | 6 refills | Status: AC

## 2019-04-11 MED FILL — OMEGA-3 ETHYL ESTERS 1 GM C: 1 | 30 days supply | Qty: 90 | Fill #1

## 2019-04-11 MED FILL — AMLODIPINE BESYLATE 5 MG TA: 5 | 90 days supply | Qty: 90 | Fill #3

## 2019-04-11 MED FILL — SPIRONOLACTONE 25 MG TABLET: 25 | 90 days supply | Qty: 90 | Fill #0

## 2019-04-27 ENCOUNTER — Other Ambulatory Visit: Payer: Self-pay | Admitting: Family Medicine

## 2019-04-27 MED FILL — SPIRONOLACTONE 25 MG TABLET: 25 | 90 days supply | Qty: 90 | Fill #0

## 2019-04-27 MED FILL — AMLODIPINE BESYLATE 10 MG T: 10 | 30 days supply | Qty: 30 | Fill #0

## 2019-04-27 MED FILL — CYCLOBENZAPRINE 10 MG TAB: 10 | 30 days supply | Qty: 60 | Fill #1

## 2019-04-27 MED FILL — ?ATORVASTATIN 20 MG TABLET: 20 | 30 days supply | Qty: 30 | Fill #0

## 2019-04-27 MED FILL — OMEGA-3 ETHYL ESTERS 1 GM C: 1 | 30 days supply | Qty: 90 | Fill #1

## 2019-05-24 MED FILL — HUMULIN 70/30 KWIKPEN: (70-30) 100 | 15 days supply | Qty: 15 | Fill #0

## 2019-05-25 MED FILL — $VICTOZA 2-PAK 18MG/3ML PEN: 18 | 30 days supply | Qty: 9 | Fill #0

## 2019-05-27 MED FILL — ?AMLODIPINE BESYL 10MG TABL: 10 | 30 days supply | Qty: 30 | Fill #1

## 2019-06-06 ENCOUNTER — Ambulatory Visit: Payer: Self-pay | Attending: Family Medicine | Admitting: Family Medicine

## 2019-06-06 ENCOUNTER — Encounter: Payer: Self-pay | Admitting: Family Medicine

## 2019-06-06 ENCOUNTER — Other Ambulatory Visit: Payer: Self-pay

## 2019-06-06 DIAGNOSIS — Z794 Long term (current) use of insulin: Secondary | ICD-10-CM

## 2019-06-06 DIAGNOSIS — K529 Noninfective gastroenteritis and colitis, unspecified: Secondary | ICD-10-CM

## 2019-06-06 DIAGNOSIS — E1169 Type 2 diabetes mellitus with other specified complication: Secondary | ICD-10-CM

## 2019-06-06 MED ORDER — HUMULIN 70/30 KWIKPEN (70-30) 100 UNIT/ML ~~LOC~~ SUPN: 50.0000 [IU] | PEN_INJECTOR | Freq: Two times a day (BID) | SUBCUTANEOUS | 3 refills | Status: DC

## 2019-06-06 MED FILL — HUMULIN 70/30 KWIKPEN: (70-30) 100 | 15 days supply | Qty: 15 | Fill #0

## 2019-06-06 NOTE — Progress Notes (Signed)
Virtual Visit via Telephone Note  I connected with Dave Arnold, on 06/06/2019 at 3:55 PM by telephone due to the COVID-19 pandemic and verified that I am speaking with the correct person using two identifiers.   Consent: I discussed the limitations, risks, security and privacy concerns of performing an evaluation and management service by telephone and the availability of in person appointments. I also discussed with the patient that there may be a patient responsible charge related to this service. The patient expressed understanding and agreed to proceed.   Location of Patient: Home  Location of Provider: Clinic   Persons participating in Telemedicine visit: Claudell Wohler Farrington-CMA Dr. Alvis Lemmings     History of Present Illness: Dave Arnold is a 49 year old male with a history of type 2 diabetes mellitus (A1c7.3), hypertension, hyperlipidemia, CHF (EF 50-55%from 06/2013), obesity who is seen for a follow-up visit. He has had diarrhea off and on x1 week, liquid in consistency and initially was dark but now is greenish. Used Peptobismol and OTC diarrhea medications which have helped. Yesterday he didn't have to use the bathroom and today he had one BM. He has a little cramping prior to Diarrhea. He had one episode of vomiting He endorses eating out a lot. Denies history of sick contacts  He has been drinking Power Aid and water and denies presence of lethargy Blood sugars have been 120-130, no hypoglycemia or hyperglycemia. Endorses compliance with his medications.  Past Medical History:  Diagnosis Date  . Diabetes mellitus without complication (HCC)   . Heart failure of unknown type (HCC) 06/2011  . Hypertension   . Obesity   . Thrombocytopenia (HCC)    Allergies  Allergen Reactions  . Lisinopril     cough    Current Outpatient Medications on File Prior to Visit  Medication Sig Dispense Refill  . amLODipine (NORVASC) 10 MG tablet Take 1 tablet  (10 mg total) by mouth daily. 90 tablet 1  . aspirin EC 81 MG tablet Take 81 mg by mouth daily.     Marland Kitchen atorvastatin (LIPITOR) 20 MG tablet Take 1 tablet (20 mg total) by mouth daily. 90 tablet 1  . carvedilol (COREG) 25 MG tablet TAKE 1 TABLET BY MOUTH 2 TIMES DAILY WITH A MEAL. 180 tablet 1  . Continuous Blood Gluc Receiver (FREESTYLE LIBRE 14 DAY READER) DEVI 1 Device by Does not apply route 3 (three) times daily. 1 each 0  . Continuous Blood Gluc Sensor (FREESTYLE LIBRE 14 DAY SENSOR) MISC 1 Device by Does not apply route every 14 (fourteen) days. Use to check blood sugar daily. Use one sensor for 14 days. 2 each 6  . cyclobenzaprine (FLEXERIL) 10 MG tablet TAKE 1 TABLET BY MOUTH TWICE DAILY AS NEEDED FOR MUSCLE SPASMS. 60 tablet 2  . EPINEPHrine (EPIPEN 2-PAK) 0.3 mg/0.3 mL IJ SOAJ injection Inject 0.3 mLs (0.3 mg total) into the muscle once. 1 Device 0  . glipiZIDE (GLUCOTROL) 10 MG tablet TAKE 1 TABLET BY MOUTH 2 TIMES DAILY BEFORE A MEAL. 180 tablet 1  . Insulin Isophane & Regular Human (HUMULIN 70/30 KWIKPEN) (70-30) 100 UNIT/ML PEN Inject 50 Units into the skin 2 (two) times daily. 120 mL 3  . Insulin Syringe-Needle U-100 (BD INSULIN SYRINGE ULTRAFINE) 31G X 15/64" 0.5 ML MISC 1 each by Does not apply route 2 (two) times daily. 100 each 5  . liraglutide (VICTOZA) 18 MG/3ML SOPN INJECT 0.3 MLS (1.8 MG TOTAL) INTO THE SKIN EVERY MORNING. 9 mL  3  . losartan (COZAAR) 100 MG tablet Take 1 tablet (100 mg total) by mouth daily. 90 tablet 1  . omega-3 acid ethyl esters (LOVAZA) 1 g capsule Take 3 capsules (3 g total) by mouth daily. Must have office visit for refills 30 capsule 2  . Omega-3 Fatty Acids (FISH OIL) 1000 MG CAPS Take 3 capsules (3,000 mg total) by mouth daily. 100 capsule 5  . spironolactone (ALDACTONE) 25 MG tablet Take 1 tablet (25 mg total) by mouth daily. 90 tablet 1  . TRUEPLUS PEN NEEDLES 31G X 5 MM MISC USE AS DIRECTED TWICE DAILY. 100 each 0  . clotrimazole (LOTRIMIN) 1 % cream  Apply 1 application topically 2 (two) times daily. (Patient not taking: Reported on 06/06/2019) 30 g 0  . ibuprofen (ADVIL,MOTRIN) 600 MG tablet Take 1 tablet (600 mg total) by mouth every 6 (six) hours as needed. (Patient not taking: Reported on 01/19/2018) 30 tablet 0   No current facility-administered medications on file prior to visit.    Observations/Objective: Awake, alert, oriented x3 Not in acute distress  CMP Latest Ref Rng & Units 03/09/2019 04/21/2018 10/21/2017  Glucose 65 - 99 mg/dL 174(Y) 814(G) 818(H)  BUN 6 - 24 mg/dL 14 11 9   Creatinine 0.76 - 1.27 mg/dL 6.31 4.97  Sodium 134 - 144 mmol/L 143 147(H) 143  Potassium 3.5 - 5.2 mmol/L 3.8 3.7 4.0  Chloride 96 - 106 mmol/L 103 103 101  CO2 20 - 29 mmol/L 25 23 25   Calcium 8.7 - 10.2 mg/dL 9.7 9.4 9.3  Total Protein 6.0 - 8.5 g/dL - - 7.2  Total Bilirubin 0.0 - 1.2 mg/dL - - 0.7  Alkaline Phos 39 - 117 IU/L - - 108  AST 0 - 40 IU/L - - 18  ALT 0 - 44 IU/L - - 18    Lipid Panel     Component Value Date/Time   CHOL 109 03/09/2019 1612   TRIG 118 03/09/2019 1612   HDL 28 (L) 03/09/2019 1612   CHOLHDL 3.9 03/09/2019 1612   CHOLHDL 5.6 (H) 05/27/2016 1024   VLDL 46 (H) 04/26/2015 1054   LDLCALC 59 03/09/2019 1612   LABVLDL 22 03/09/2019 1612    Lab Results  Component Value Date   HGBA1C 7.3 (A) 03/07/2019    Assessment and Plan: 1. Type 2 diabetes mellitus with other specified complication, with long-term current use of insulin (HCC) Controlled with A1c of 7.3 Continue current regimen Foot exam at next visit Counseled on Diabetic diet, my plate method, 14/11/2018 minutes of moderate intensity exercise/week Blood sugar logs with fasting goals of 80-120 mg/dl, random of less than 14/09/2018 and in the event of sugars less than 60 mg/dl or greater than 378 mg/dl encouraged to notify the clinic. Advised on the need for annual eye exams, annual foot exams, Pneumonia vaccine. - insulin isophane & regular human (HUMULIN 70/30  KWIKPEN) (70-30) 100 UNIT/ML KwikPen; Inject 50 Units into the skin 2 (two) times daily.  Dispense: 120 mL; Refill: 3  2. Gastroenteritis Improving Advised to stay hydrated Comply with BRAT diet   Follow Up Instructions: 3 Months   I discussed the assessment and treatment plan with the patient. The patient was provided an opportunity to ask questions and all were answered. The patient agreed with the plan and demonstrated an understanding of the instructions.   The patient was advised to call back or seek an in-person evaluation if the symptoms worsen or if the condition fails to improve  as anticipated.     I provided 15 minutes total of non-face-to-face time during this encounter including median intraservice time, reviewing previous notes, investigations, ordering medications, medical decision making, coordinating care and patient verbalized understanding at the end of the visit.     Charlott Rakes, MD, FAAFP. [facility] [place], [place]   06/06/2019, 3:55 PM

## 2019-06-06 NOTE — Progress Notes (Signed)
Patient has been called and DOB has been verified. Patient has been screened and transferred to PCP to start phone visit.   Diarrhea off and on for 1 week, throw up on time denies any fever, sore throat or cough. Color was dark colored at first now it is green in color.

## 2019-06-07 MED FILL — OMEGA-3 ETHYL ESTERS 1 GM C: 1 | 30 days supply | Qty: 90 | Fill #2

## 2019-06-21 MED FILL — ?CARVEDILOL 25 MG TABLET: 25 | 90 days supply | Qty: 180 | Fill #1

## 2019-06-21 MED FILL — LOSARTAN POTASSIUM 100 MG T: 100 | 90 days supply | Qty: 90 | Fill #1

## 2019-06-21 MED FILL — CYCLOBENZAPRINE 10 MG TAB: 10 | 30 days supply | Qty: 60 | Fill #2

## 2019-06-21 MED FILL — ?ATORVASTATIN 20 MG TABLET: 20 | 30 days supply | Qty: 30 | Fill #1

## 2019-06-21 MED FILL — AMLODIPINE BESYLATE 10 MG T: 10 | 30 days supply | Qty: 30 | Fill #2

## 2019-06-21 MED FILL — ?GLIPIZIDE 10 MG TABLET: 10 | 90 days supply | Qty: 180 | Fill #1

## 2019-07-04 MED FILL — HUMULIN 70/30 KWIKPEN: (70-30) 100 | 15 days supply | Qty: 15 | Fill #1

## 2019-07-04 MED FILL — $VICTOZA 2-PAK 18MG/3ML PEN: 18 | 30 days supply | Qty: 9 | Fill #1

## 2019-07-19 MED FILL — SPIRONOLACTONE 25 MG TABLET: 25 | 90 days supply | Qty: 90 | Fill #1

## 2019-07-19 MED FILL — HUMULIN 70/30 KWIKPEN: (70-30) 100 | 15 days supply | Qty: 15 | Fill #2

## 2019-07-19 MED FILL — AMLODIPINE BESYLATE 10 MG T: 10 | 30 days supply | Qty: 30 | Fill #3

## 2019-07-19 MED FILL — ?ATORVASTATIN 20 MG TABLET: 20 | 30 days supply | Qty: 30 | Fill #2

## 2019-07-22 MED FILL — OMEGA-3 ETHYL ESTERS 1 GM C: 1 | 30 days supply | Qty: 90 | Fill #3

## 2019-08-04 MED FILL — $VICTOZA 2-PAK 18MG/3ML PEN: 18 | 30 days supply | Qty: 9 | Fill #2

## 2019-08-05 ENCOUNTER — Other Ambulatory Visit: Payer: Self-pay | Admitting: Family Medicine

## 2019-08-05 DIAGNOSIS — G8929 Other chronic pain: Secondary | ICD-10-CM

## 2019-08-05 DIAGNOSIS — M546 Pain in thoracic spine: Secondary | ICD-10-CM

## 2019-08-05 MED FILL — CYCLOBENZAPRINE 10 MG TAB: 10 | 30 days supply | Qty: 60 | Fill #0

## 2019-08-05 MED FILL — HUMULIN 70/30 KWIKPEN: (70-30) 100 | 15 days supply | Qty: 15 | Fill #3

## 2019-08-23 MED FILL — ATORVASTATIN CALCIUM 20 MG: 20 | 30 days supply | Qty: 30 | Fill #3

## 2019-08-23 MED FILL — AMLODIPINE BESYLATE 10 MG T: 10 | 30 days supply | Qty: 30 | Fill #4

## 2019-08-31 MED FILL — HUMULIN 70/30 KWIKPEN: (70-30) 100 | 15 days supply | Qty: 15 | Fill #4

## 2019-08-31 MED FILL — $VICTOZA 2-PAK 18MG/3ML PEN: 18 | 30 days supply | Qty: 9 | Fill #3

## 2019-09-02 MED FILL — AMLODIPINE BESYLATE 10 MG T: 10 | 30 days supply | Qty: 30 | Fill #4

## 2019-09-02 MED FILL — ATORVASTATIN CALCIUM 20 MG: 20 | 30 days supply | Qty: 30 | Fill #3

## 2019-09-12 MED FILL — OMEGA-3 ETHYL ESTERS 1 GM C: 1 | 30 days supply | Qty: 90 | Fill #5

## 2019-09-12 MED FILL — LOSARTAN POTASSIUM 100 MG T: 100 | 90 days supply | Qty: 90 | Fill #0

## 2019-09-12 MED FILL — CYCLOBENZAPRINE 10 MG TAB: 10 | 30 days supply | Qty: 60 | Fill #1

## 2019-09-26 ENCOUNTER — Other Ambulatory Visit: Payer: Self-pay | Admitting: Family Medicine

## 2019-09-26 DIAGNOSIS — Z794 Long term (current) use of insulin: Secondary | ICD-10-CM

## 2019-09-26 MED FILL — ?AMLODIPINE BESYL 10MG TABL: 10 | 30 days supply | Qty: 30 | Fill #5

## 2019-09-26 MED FILL — ?ATORVASTATIN 20 MG TABLET: 20 | 30 days supply | Qty: 30 | Fill #4

## 2019-09-27 MED FILL — $VICTOZA 2-PAK 18MG/3ML PEN: 18 | 60 days supply | Qty: 18 | Fill #0

## 2019-10-13 ENCOUNTER — Encounter: Payer: Self-pay | Admitting: Family Medicine

## 2019-10-13 ENCOUNTER — Other Ambulatory Visit: Payer: Self-pay | Admitting: Family Medicine

## 2019-10-13 ENCOUNTER — Ambulatory Visit: Payer: Self-pay | Attending: Internal Medicine

## 2019-10-13 ENCOUNTER — Other Ambulatory Visit: Payer: Self-pay

## 2019-10-13 ENCOUNTER — Ambulatory Visit: Payer: Self-pay | Attending: Family Medicine | Admitting: Family Medicine

## 2019-10-13 VITALS — BP 123/72 | HR 63 | Ht 73.0 in | Wt 281.6 lb

## 2019-10-13 DIAGNOSIS — I5022 Chronic systolic (congestive) heart failure: Secondary | ICD-10-CM

## 2019-10-13 DIAGNOSIS — I10 Essential (primary) hypertension: Secondary | ICD-10-CM

## 2019-10-13 DIAGNOSIS — Z794 Long term (current) use of insulin: Secondary | ICD-10-CM

## 2019-10-13 DIAGNOSIS — N528 Other male erectile dysfunction: Secondary | ICD-10-CM

## 2019-10-13 DIAGNOSIS — Z1159 Encounter for screening for other viral diseases: Secondary | ICD-10-CM

## 2019-10-13 DIAGNOSIS — E7800 Pure hypercholesterolemia, unspecified: Secondary | ICD-10-CM

## 2019-10-13 DIAGNOSIS — Z23 Encounter for immunization: Secondary | ICD-10-CM

## 2019-10-13 DIAGNOSIS — E1169 Type 2 diabetes mellitus with other specified complication: Secondary | ICD-10-CM

## 2019-10-13 LAB — POCT GLYCOSYLATED HEMOGLOBIN (HGB A1C): HbA1c, POC (controlled diabetic range): 6.6 % (ref 0.0–7.0)

## 2019-10-13 LAB — GLUCOSE, POCT (MANUAL RESULT ENTRY): POC Glucose: 145 mg/dl — AB (ref 70–99)

## 2019-10-13 MED ORDER — ATORVASTATIN CALCIUM 20 MG PO TABS: 20.0000 mg | ORAL_TABLET | Freq: Every day | ORAL | 1 refills | Status: DC

## 2019-10-13 MED ORDER — GLIPIZIDE 10 MG PO TABS: ORAL_TABLET | ORAL | 1 refills | Status: DC

## 2019-10-13 MED ORDER — CARVEDILOL 25 MG PO TABS: ORAL_TABLET | ORAL | 1 refills | Status: DC

## 2019-10-13 MED ORDER — HUMULIN 70/30 KWIKPEN (70-30) 100 UNIT/ML ~~LOC~~ SUPN: 50.0000 [IU] | PEN_INJECTOR | Freq: Two times a day (BID) | SUBCUTANEOUS | 3 refills | Status: DC

## 2019-10-13 MED ORDER — SILDENAFIL CITRATE 50 MG PO TABS: 50.0000 mg | ORAL_TABLET | Freq: Every day | ORAL | 0 refills | Status: DC | PRN

## 2019-10-13 MED ORDER — LOSARTAN POTASSIUM 100 MG PO TABS: 100.0000 mg | ORAL_TABLET | Freq: Every day | ORAL | 1 refills | Status: DC

## 2019-10-13 MED ORDER — AMLODIPINE BESYLATE 10 MG PO TABS: 10.0000 mg | ORAL_TABLET | Freq: Every day | ORAL | 1 refills | Status: DC

## 2019-10-13 MED ORDER — VICTOZA 18 MG/3ML ~~LOC~~ SOPN: 1.8000 mg | PEN_INJECTOR | Freq: Every day | SUBCUTANEOUS | 6 refills | Status: DC

## 2019-10-13 MED ORDER — SPIRONOLACTONE 25 MG PO TABS: 25.0000 mg | ORAL_TABLET | Freq: Every day | ORAL | 1 refills | Status: DC

## 2019-10-13 MED FILL — !VIAGRA 25 MG TABLET: 25 | 30 days supply | Qty: 20 | Fill #0

## 2019-10-13 MED FILL — ?GLIPIZIDE 10 MG TABLET: 10 | 30 days supply | Qty: 60 | Fill #0

## 2019-10-13 MED FILL — OMEGA-3 ETHYL ESTERS 1 GM C: 1 | 20 days supply | Qty: 60 | Fill #6

## 2019-10-13 MED FILL — ?CARVEDILOL 25 MG TABLET: 25 | 30 days supply | Qty: 60 | Fill #0

## 2019-10-13 MED FILL — ?SPIRONOLACTONE 25 MG TABLE: 25 | 30 days supply | Qty: 30 | Fill #0

## 2019-10-13 NOTE — Patient Instructions (Signed)
Diabetes Mellitus and Foot Care Foot care is an important part of your health, especially when you have diabetes. Diabetes may cause you to have problems because of poor blood flow (circulation) to your feet and legs, which can cause your skin to:  Become thinner and drier.  Break more easily.  Heal more slowly.  Peel and crack. You may also have nerve damage (neuropathy) in your legs and feet, causing decreased feeling in them. This means that you may not notice minor injuries to your feet that could lead to more serious problems. Noticing and addressing any potential problems early is the best way to prevent future foot problems. How to care for your feet Foot hygiene  Wash your feet daily with warm water and mild soap. Do not use hot water. Then, pat your feet and the areas between your toes until they are completely dry. Do not soak your feet as this can dry your skin.  Trim your toenails straight across. Do not dig under them or around the cuticle. File the edges of your nails with an emery board or nail file.  Apply a moisturizing lotion or petroleum jelly to the skin on your feet and to dry, brittle toenails. Use lotion that does not contain alcohol and is unscented. Do not apply lotion between your toes. Shoes and socks  Wear clean socks or stockings every day. Make sure they are not too tight. Do not wear knee-high stockings since they may decrease blood flow to your legs.  Wear shoes that fit properly and have enough cushioning. Always look in your shoes before you put them on to be sure there are no objects inside.  To break in new shoes, wear them for just a few hours a day. This prevents injuries on your feet. Wounds, scrapes, corns, and calluses  Check your feet daily for blisters, cuts, bruises, sores, and redness. If you cannot see the bottom of your feet, use a mirror or ask someone for help.  Do not cut corns or calluses or try to remove them with medicine.  If you  find a minor scrape, cut, or break in the skin on your feet, keep it and the skin around it clean and dry. You may clean these areas with mild soap and water. Do not clean the area with peroxide, alcohol, or iodine.  If you have a wound, scrape, corn, or callus on your foot, look at it several times a day to make sure it is healing and not infected. Check for: ? Redness, swelling, or pain. ? Fluid or blood. ? Warmth. ? Pus or a bad smell. General instructions  Do not cross your legs. This may decrease blood flow to your feet.  Do not use heating pads or hot water bottles on your feet. They may burn your skin. If you have lost feeling in your feet or legs, you may not know this is happening until it is too late.  Protect your feet from hot and cold by wearing shoes, such as at the beach or on hot pavement.  Schedule a complete foot exam at least once a year (annually) or more often if you have foot problems. If you have foot problems, report any cuts, sores, or bruises to your health care provider immediately. Contact a health care provider if:  You have a medical condition that increases your risk of infection and you have any cuts, sores, or bruises on your feet.  You have an injury that is not   healing.  You have redness on your legs or feet.  You feel burning or tingling in your legs or feet.  You have pain or cramps in your legs and feet.  Your legs or feet are numb.  Your feet always feel cold.  You have pain around a toenail. Get help right away if:  You have a wound, scrape, corn, or callus on your foot and: ? You have pain, swelling, or redness that gets worse. ? You have fluid or blood coming from the wound, scrape, corn, or callus. ? Your wound, scrape, corn, or callus feels warm to the touch. ? You have pus or a bad smell coming from the wound, scrape, corn, or callus. ? You have a fever. ? You have a red line going up your leg. Summary  Check your feet every day  for cuts, sores, red spots, swelling, and blisters.  Moisturize feet and legs daily.  Wear shoes that fit properly and have enough cushioning.  If you have foot problems, report any cuts, sores, or bruises to your health care provider immediately.  Schedule a complete foot exam at least once a year (annually) or more often if you have foot problems. This information is not intended to replace advice given to you by your health care provider. Make sure you discuss any questions you have with your health care provider. Document Revised: 12/08/2018 Document Reviewed: 04/18/2016 Elsevier Patient Education  2020 [employer] Inc.  

## 2019-10-13 NOTE — Progress Notes (Signed)
   Covid-19 Vaccination Clinic  Name:  JOSAIAH MUHAMMED    MRN: [id number] DOB: Oct 28, 1970  10/13/2019  Mr. Griffey was observed post Covid-19 immunization for 15 minutes without incident. He was provided with Vaccine Information Sheet and instruction to access the V-Safe system.   Mr. Collymore was instructed to call 911 with any severe reactions post vaccine: Marland Kitchen Difficulty breathing  . Swelling of face and throat  . A fast heartbeat  . A bad rash all over body  . Dizziness and weakness   Immunizations Administered    Name Date Dose VIS Date Route   Pfizer COVID-19 Vaccine 10/13/2019 11:41 AM 0.3 mL 05/25/2018 Intramuscular   Manufacturer: [employer], [employer]   Lot: [id number]   NDC: [address]-6

## 2019-10-13 NOTE — Progress Notes (Signed)
Subjective:  Patient ID: Dave Arnold, male    DOB: 06/17/1970  Age: 49 y.o. MRN: [id number]  CC: Diabetes   HPI AARONJAMES KELSAY is a 49 year old male with a history of type 2 diabetes mellitus (A1c6.6), hypertension, hyperlipidemia, CHF (EF 50-55%from 06/2013), obesity who is seen for a follow-up visit.  He complains of ED for the last 4-6 months and is wondering if this is related to his medications.  He has the sexual urge but discovers his erections do not last. With regards to his diabetes mellitus he has been compliant with his regimen and denies any hypoglycemic episodes, visual concerns or numbness in extremities. Compliant with his statin and his antihypertensive. From a CHF standpoint he has had no recent exacerbations, pedal edema, dyspnea or weight gain. Past Medical History:  Diagnosis Date  . Diabetes mellitus without complication ([place])   . Heart failure of unknown type ([place]) 06/2011  . Hypertension   . Obesity   . Thrombocytopenia ([place])     History reviewed. No pertinent surgical history.  Family History  Problem Relation Age of Onset  . Heart disease Mother     Allergies  Allergen Reactions  . Lisinopril     cough    Outpatient Medications Prior to Visit  Medication Sig Dispense Refill  . aspirin EC 81 MG tablet Take 81 mg by mouth daily.     . cyclobenzaprine (FLEXERIL) 10 MG tablet TAKE 1 TABLET BY MOUTH TWICE DAILY AS NEEDED FOR MUSCLE SPASMS. 60 tablet 2  . EPINEPHrine (EPIPEN 2-PAK) 0.3 mg/0.3 mL IJ SOAJ injection Inject 0.3 mLs (0.3 mg total) into the muscle once. 1 Device 0  . Insulin Syringe-Needle U-100 (BD INSULIN SYRINGE ULTRAFINE) 31G X 15/64" 0.5 ML MISC 1 each by Does not apply route 2 (two) times daily. 100 each 5  . omega-3 acid ethyl esters (LOVAZA) 1 g capsule Take 3 capsules (3 g total) by mouth daily. Must have office visit for refills 30 capsule 2  . Omega-3 Fatty Acids (FISH OIL) 1000 MG CAPS Take 3 capsules (3,000 mg total)  by mouth daily. 100 capsule 5  . TRUEPLUS PEN NEEDLES 31G X 5 MM MISC USE AS DIRECTED TWICE DAILY. 100 each 0  . amLODipine (NORVASC) 10 MG tablet Take 1 tablet (10 mg total) by mouth daily. 90 tablet 1  . atorvastatin (LIPITOR) 20 MG tablet Take 1 tablet (20 mg total) by mouth daily. 90 tablet 1  . carvedilol (COREG) 25 MG tablet TAKE 1 TABLET BY MOUTH 2 TIMES DAILY WITH A MEAL. 180 tablet 1  . glipiZIDE (GLUCOTROL) 10 MG tablet TAKE 1 TABLET BY MOUTH 2 TIMES DAILY BEFORE A MEAL. 180 tablet 1  . insulin isophane & regular human (HUMULIN 70/30 KWIKPEN) (70-30) 100 UNIT/ML KwikPen Inject 50 Units into the skin 2 (two) times daily. 120 mL 3  . losartan (COZAAR) 100 MG tablet Take 1 tablet (100 mg total) by mouth daily. 90 tablet 1  . spironolactone (ALDACTONE) 25 MG tablet Take 1 tablet (25 mg total) by mouth daily. 90 tablet 1  . VICTOZA 18 MG/3ML SOPN INJECT 0.3 MLS (1.8 MG TOTAL) INTO THE SKIN EVERY MORNING. 9 mL 1  . clotrimazole (LOTRIMIN) 1 % cream Apply 1 application topically 2 (two) times daily. (Patient not taking: Reported on 06/06/2019) 30 g 0  . Continuous Blood Gluc Receiver (FREESTYLE LIBRE 14 DAY READER) DEVI 1 Device by Does not apply route 3 (three) times daily. (Patient not taking: Reported  on 10/13/2019) 1 each 0  . Continuous Blood Gluc Sensor (FREESTYLE LIBRE 14 DAY SENSOR) MISC 1 Device by Does not apply route every 14 (fourteen) days. Use to check blood sugar daily. Use one sensor for 14 days. (Patient not taking: Reported on 10/13/2019) 2 each 6  . ibuprofen (ADVIL,MOTRIN) 600 MG tablet Take 1 tablet (600 mg total) by mouth every 6 (six) hours as needed. (Patient not taking: Reported on 01/19/2018) 30 tablet 0   No facility-administered medications prior to visit.     ROS Review of Systems  Constitutional: Negative for activity change and appetite change.  HENT: Negative for sinus pressure and sore throat.   Eyes: Negative for visual disturbance.  Respiratory: Negative for  cough, chest tightness and shortness of breath.   Cardiovascular: Negative for chest pain and leg swelling.  Gastrointestinal: Negative for abdominal distention, abdominal pain, constipation and diarrhea.  Endocrine: Negative.   Genitourinary: Negative for dysuria.  Musculoskeletal: Negative for joint swelling and myalgias.  Skin: Negative for rash.  Allergic/Immunologic: Negative.   Neurological: Negative for weakness, light-headedness and numbness.  Psychiatric/Behavioral: Negative for dysphoric mood and suicidal ideas.    Objective:  BP 123/72   Pulse 63   Ht 6' 1"  (1.854 m)   Wt 281 lb 9.6 oz (127.7 kg)   SpO2 97%   BMI 37.15 kg/m   BP/Weight 10/13/2019 03/07/2019 6/62/9476  Systolic BP 546 503 546  Diastolic BP 72 71 95  Wt. (Lbs) 281.6 284 280  BMI 37.15 37.47 36.94      Physical Exam Constitutional:      Appearance: He is well-developed.  Neck:     Vascular: No JVD.  Cardiovascular:     Rate and Rhythm: Normal rate.     Heart sounds: Normal heart sounds. No murmur heard.   Pulmonary:     Effort: Pulmonary effort is normal.     Breath sounds: Normal breath sounds. No wheezing or rales.  Chest:     Chest wall: No tenderness.  Abdominal:     General: Bowel sounds are normal. There is no distension.     Palpations: Abdomen is soft. There is no mass.     Tenderness: There is no abdominal tenderness.  Musculoskeletal:        General: Normal range of motion.     Right lower leg: No edema.     Left lower leg: No edema.  Neurological:     Mental Status: He is alert and oriented to person, place, and time.  Psychiatric:        Mood and Affect: Mood normal.     CMP Latest Ref Rng & Units 03/09/2019 04/21/2018 10/21/2017  Glucose 65 - 99 mg/dL 172(H) 173(H) 257(H)  BUN 6 - 24 mg/dL 14 11 9   Creatinine 0.76 - 1.27 mg/dL 0.97 0.82 0.87  Sodium 134 - 144 mmol/L 143 147(H) 143  Potassium 3.5 - 5.2 mmol/L 3.8 3.7 4.0  Chloride 96 - 106 mmol/L 103 103 101  CO2 20 -  29 mmol/L 25 23 25   Calcium 8.7 - 10.2 mg/dL 9.7 9.4 9.3  Total Protein 6.0 - 8.5 g/dL - - 7.2  Total Bilirubin 0.0 - 1.2 mg/dL - - 0.7  Alkaline Phos 39 - 117 IU/L - - 108  AST 0 - 40 IU/L - - 18  ALT 0 - 44 IU/L - - 18    Lipid Panel     Component Value Date/Time   CHOL 109 03/09/2019 1612  TRIG 118 03/09/2019 1612   HDL 28 (L) 03/09/2019 1612   CHOLHDL 3.9 03/09/2019 1612   CHOLHDL 5.6 (H) 05/27/2016 1024   VLDL 46 (H) 04/26/2015 1054   LDLCALC 59 03/09/2019 1612    CBC    Component Value Date/Time   WBC 13.7 (H) 12/05/2014 1520   RBC 5.50 12/05/2014 1520   HGB 16.9 12/05/2014 1520   HCT 48.2 12/05/2014 1520   PLT 148 (L) 12/05/2014 1520   MCV 87.6 12/05/2014 1520   MCH 30.7 12/05/2014 1520   MCHC 35.1 12/05/2014 1520   RDW 13.1 12/05/2014 1520   LYMPHSABS 2.3 12/05/2014 1520   MONOABS 1.2 (H) 12/05/2014 1520   EOSABS 0.1 12/05/2014 1520   BASOSABS 0.0 12/05/2014 1520    Lab Results  Component Value Date   HGBA1C 6.6 10/13/2019     Assessment & Plan:  1. Type 2 diabetes mellitus with other specified complication, with long-term current use of insulin (HCC) Controlled with A1c of 6.6 Continue current regimen - POCT glucose (manual entry) - POCT glycosylated hemoglobin (Hb A1C) - glipiZIDE (GLUCOTROL) 10 MG tablet; TAKE 1 TABLET BY MOUTH 2 TIMES DAILY BEFORE A MEAL.  Dispense: 180 tablet; Refill: 1 - insulin isophane & regular human (HUMULIN 70/30 KWIKPEN) (70-30) 100 UNIT/ML KwikPen; Inject 50 Units into the skin 2 (two) times daily.  Dispense: 120 mL; Refill: 3 - liraglutide (VICTOZA) 18 MG/3ML SOPN; Inject 0.3 mLs (1.8 mg total) into the skin daily.  Dispense: 9 mL; Refill: 6 - CMP14+EGFR - Microalbumin / creatinine urine ratio - LP+Non-HDL Cholesterol  2. Need for hepatitis C screening test - HCV RNA quant rflx ultra or genotyp([facility])  3. Essential hypertension Controlled Counseled on blood pressure goal of less than 130/80, low-sodium,  DASH diet, medication compliance, 150 minutes of moderate intensity exercise per week. Discussed medication compliance, adverse effects. - amLODipine (NORVASC) 10 MG tablet; Take 1 tablet (10 mg total) by mouth daily.  Dispense: 90 tablet; Refill: 1 - carvedilol (COREG) 25 MG tablet; TAKE 1 TABLET BY MOUTH 2 TIMES DAILY WITH A MEAL.  Dispense: 180 tablet; Refill: 1 - glipiZIDE (GLUCOTROL) 10 MG tablet; TAKE 1 TABLET BY MOUTH 2 TIMES DAILY BEFORE A MEAL.  Dispense: 180 tablet; Refill: 1 - losartan (COZAAR) 100 MG tablet; Take 1 tablet (100 mg total) by mouth daily.  Dispense: 90 tablet; Refill: 1 - spironolactone (ALDACTONE) 25 MG tablet; Take 1 tablet (25 mg total) by mouth daily.  Dispense: 90 tablet; Refill: 1  4. Pure hypercholesterolemia Controlled Low-cholesterol diet - atorvastatin (LIPITOR) 20 MG tablet; Take 1 tablet (20 mg total) by mouth daily.  Dispense: 90 tablet; Refill: 1 - glipiZIDE (GLUCOTROL) 10 MG tablet; TAKE 1 TABLET BY MOUTH 2 TIMES DAILY BEFORE A MEAL.  Dispense: 180 tablet; Refill: 1  5. Chronic systolic congestive heart failure (HCC) Stable with EF of 50 to 55% from 06/2013 Euvolemic He is due for repeat echocardiogram which I have ordered Unable to tolerate ACE inhibitor due to allergy.  Continue spironolactone, beta-blocker - spironolactone (ALDACTONE) 25 MG tablet; Take 1 tablet (25 mg total) by mouth daily.  Dispense: 90 tablet; Refill: 1 - ECHOCARDIOGRAM COMPLETE; Future  6. Other male erectile dysfunction We have discussed possible causes including medical conditions, age, medications We will initiate Viagra.   Meds ordered this encounter  Medications  . amLODipine (NORVASC) 10 MG tablet    Sig: Take 1 tablet (10 mg total) by mouth daily.    Dispense:  90 tablet  Refill:  1  . atorvastatin (LIPITOR) 20 MG tablet    Sig: Take 1 tablet (20 mg total) by mouth daily.    Dispense:  90 tablet    Refill:  1  . carvedilol (COREG) 25 MG tablet    Sig: TAKE  1 TABLET BY MOUTH 2 TIMES DAILY WITH A MEAL.    Dispense:  180 tablet    Refill:  1    90-day supply  . glipiZIDE (GLUCOTROL) 10 MG tablet    Sig: TAKE 1 TABLET BY MOUTH 2 TIMES DAILY BEFORE A MEAL.    Dispense:  180 tablet    Refill:  1  . insulin isophane & regular human (HUMULIN 70/30 KWIKPEN) (70-30) 100 UNIT/ML KwikPen    Sig: Inject 50 Units into the skin 2 (two) times daily.    Dispense:  120 mL    Refill:  3  . losartan (COZAAR) 100 MG tablet    Sig: Take 1 tablet (100 mg total) by mouth daily.    Dispense:  90 tablet    Refill:  1  . spironolactone (ALDACTONE) 25 MG tablet    Sig: Take 1 tablet (25 mg total) by mouth daily.    Dispense:  90 tablet    Refill:  1    90-day supply  . liraglutide (VICTOZA) 18 MG/3ML SOPN    Sig: Inject 0.3 mLs (1.8 mg total) into the skin daily.    Dispense:  9 mL    Refill:  6  . sildenafil (VIAGRA) 50 MG tablet    Sig: Take 1 tablet (50 mg total) by mouth daily as needed for erectile dysfunction. At least 24 hours between doses    Dispense:  10 tablet    Refill:  0    Follow-up: Return in about 6 months (around 04/14/2020) for Chronic disease management.       Charlott Rakes, MD, FAAFP. [facility] [place], [place]   10/13/2019, 10:35 AM

## 2019-10-13 NOTE — Progress Notes (Signed)
Wants to discuss COVID vaccine.

## 2019-10-17 ENCOUNTER — Ambulatory Visit (HOSPITAL_COMMUNITY): Admission: RE | Admit: 2019-10-17 | Payer: Self-pay | Source: Ambulatory Visit

## 2019-10-17 LAB — CMP14+EGFR
ALT: 17 IU/L (ref 0–44)
AST: 19 IU/L (ref 0–40)
Albumin/Globulin Ratio: 1.5 (ref 1.2–2.2)
Albumin: 4.4 g/dL (ref 4.0–5.0)
Alkaline Phosphatase: 95 IU/L (ref 48–121)
BUN/Creatinine Ratio: 12 (ref 9–20)
BUN: 11 mg/dL (ref 6–24)
Bilirubin Total: 0.5 mg/dL (ref 0.0–1.2)
CO2: 23 mmol/L (ref 20–29)
Calcium: 9.3 mg/dL (ref 8.7–10.2)
Chloride: 103 mmol/L (ref 96–106)
Creatinine, Ser: 0.91 mg/dL (ref 0.76–1.27)
GFR calc Af Amer: 116 mL/min/{1.73_m2} (ref >59)
GFR calc non Af Amer: 101 mL/min/{1.73_m2} (ref >59)
Globulin, Total: 3 g/dL (ref 1.5–4.5)
Glucose: 137 mg/dL — ABNORMAL HIGH (ref 65–99)
Potassium: 3.7 mmol/L (ref 3.5–5.2)
Sodium: 141 mmol/L (ref 134–144)
Total Protein: 7.4 g/dL (ref 6.0–8.5)

## 2019-10-17 LAB — LP+NON-HDL CHOLESTEROL
Cholesterol, Total: 100 mg/dL (ref 100–199)
HDL: 28 mg/dL — ABNORMAL LOW (ref >39)
LDL Chol Calc (NIH): 52 mg/dL (ref 0–99)
Total Non-HDL-Chol (LDL+VLDL): 72 mg/dL (ref 0–129)
Triglycerides: 108 mg/dL (ref 0–149)
VLDL Cholesterol Cal: 20 mg/dL (ref 5–40)

## 2019-10-17 LAB — MICROALBUMIN / CREATININE URINE RATIO
Creatinine, Urine: 317.4 mg/dL
Microalb/Creat Ratio: 8 mg/g creat (ref 0–29)
Microalbumin, Urine: 24.1 ug/mL

## 2019-10-17 LAB — TESTOSTERONE,FREE AND TOTAL
Testosterone, Free: 8.4 pg/mL (ref 6.8–21.5)
Testosterone: 455 ng/dL (ref 264–916)

## 2019-10-17 LAB — HCV RNA QUANT RFLX ULTRA OR GENOTYP: HCV Quant Baseline: NOT DETECTED IU/mL

## 2019-10-19 ENCOUNTER — Other Ambulatory Visit: Payer: Self-pay | Admitting: Family Medicine

## 2019-10-19 DIAGNOSIS — E7800 Pure hypercholesterolemia, unspecified: Secondary | ICD-10-CM

## 2019-10-19 DIAGNOSIS — Z794 Long term (current) use of insulin: Secondary | ICD-10-CM

## 2019-10-19 DIAGNOSIS — I10 Essential (primary) hypertension: Secondary | ICD-10-CM

## 2019-10-19 DIAGNOSIS — E119 Type 2 diabetes mellitus without complications: Secondary | ICD-10-CM

## 2019-10-19 DIAGNOSIS — I5022 Chronic systolic (congestive) heart failure: Secondary | ICD-10-CM

## 2019-10-19 NOTE — Telephone Encounter (Signed)
Requested Prescriptions  Pending Prescriptions Disp Refills   TRUEPLUS PEN NEEDLES 31G X 5 MM MISC [Pharmacy Med Name: TRUEPLUS PEN NDL 31GX3/16" 31G X 5 MM Miscellaneous] 100 each 3    Sig: USE AS DIRECTED TWICE DAILY.     Endocrinology: Diabetes - Testing Supplies Passed - 10/19/2019  5:15 PM      Passed - Valid encounter within last 12 months    Recent Outpatient Visits          6 days ago Type 2 diabetes mellitus with other specified complication, with long-term current use of insulin (HCC)   [facility] [place], [name], MD   4 months ago Gastroenteritis   [facility] [place], [place], MD   7 months ago Type 2 diabetes mellitus with other specified complication, with long-term current use of insulin (HCC)   [facility] [name], MD   1 year ago Other constipation   [place] [place], [name], MD   1 year ago Diabetes mellitus due to underlying condition with hyperglycemia, with long-term current use of insulin [facility])   [facility] [facility] [name], MD

## 2019-10-20 ENCOUNTER — Telehealth: Payer: Self-pay

## 2019-10-20 MED FILL — TRUEPLUS 5-BEVEL PEN NEEDLE: 31G X 5 MM | 50 days supply | Qty: 100 | Fill #0

## 2019-10-20 NOTE — Telephone Encounter (Signed)
-----   Message from [name], MD sent at 10/17/2019  1:28 PM EDT ----- Labs reveal normal testosterone, normal kidney and liver function, normal urine, normal total cholesterol but his good cholesterol (HDL) is slightly low.  Recommendation will be to increase physical activity which will help increase his HDL level.

## 2019-10-20 NOTE — Telephone Encounter (Signed)
Patient was called and a voicemail was left informing patient to return phone call for lab results. 

## 2019-11-01 MED FILL — AMLODIPINE BESYLATE 10 MG T: 10 | 30 days supply | Qty: 30 | Fill #0

## 2019-11-01 MED FILL — ?ATORVASTATIN 20 MG TABLET: 20 | 30 days supply | Qty: 30 | Fill #5

## 2019-11-01 MED FILL — CYCLOBENZAPRINE 10 MG TAB: 10 | 30 days supply | Qty: 60 | Fill #2

## 2019-11-07 MED FILL — TRUEPLUS 5-BEVEL PEN NEEDLE: 31G X 5 MM | 50 days supply | Qty: 100 | Fill #0

## 2019-11-08 ENCOUNTER — Ambulatory Visit: Payer: Self-pay | Attending: Internal Medicine

## 2019-11-08 DIAGNOSIS — Z23 Encounter for immunization: Secondary | ICD-10-CM

## 2019-11-08 NOTE — Progress Notes (Signed)
covid

## 2019-11-10 ENCOUNTER — Other Ambulatory Visit: Payer: Self-pay | Admitting: Family Medicine

## 2019-11-10 MED FILL — OMEGA-3 ETHYL ESTERS 1 GM C: 1 | 20 days supply | Qty: 60 | Fill #0

## 2019-11-30 ENCOUNTER — Other Ambulatory Visit: Payer: Self-pay | Admitting: Family Medicine

## 2019-11-30 DIAGNOSIS — N528 Other male erectile dysfunction: Secondary | ICD-10-CM

## 2019-11-30 MED FILL — AMLODIPINE BESYLATE 10 MG T: 10 | 30 days supply | Qty: 30 | Fill #1

## 2019-11-30 MED FILL — ?SPIRONOLACTONE 25 MG TABLE: 25 | 30 days supply | Qty: 30 | Fill #1

## 2019-11-30 MED FILL — ?ATORVASTATIN 20 MG TABLET: 20 | 90 days supply | Qty: 90 | Fill #0

## 2019-11-30 MED FILL — VICTOZA 18 MG/3 ML INJECT P: 18 | 90 days supply | Qty: 27 | Fill #0

## 2019-11-30 MED FILL — !VIAGRA 25 MG TABLET: 25 | 30 days supply | Qty: 20 | Fill #0

## 2019-11-30 MED FILL — TRUEPLUS 5-BEVEL PEN NEEDLE: 31G X 5 MM | 50 days supply | Qty: 100 | Fill #1

## 2019-12-08 ENCOUNTER — Other Ambulatory Visit: Payer: Self-pay | Admitting: Family Medicine

## 2019-12-08 DIAGNOSIS — M546 Pain in thoracic spine: Secondary | ICD-10-CM

## 2019-12-08 MED FILL — $VICTOZA 2-PAK 18MG/3ML PEN: 18 | 90 days supply | Qty: 27 | Fill #0

## 2019-12-08 MED FILL — ?ATORVASTATIN 20 MG TABLET: 20 | 90 days supply | Qty: 90 | Fill #0

## 2019-12-08 MED FILL — LOSARTAN POTASSIUM 100 MG T: 100 | 90 days supply | Qty: 90 | Fill #1

## 2019-12-08 MED FILL — AMLODIPINE BESYLATE 10 MG T: 10 | 30 days supply | Qty: 30 | Fill #1

## 2019-12-08 MED FILL — ?SPIRONOLACTONE 25 MG TABLE: 25 | 30 days supply | Qty: 30 | Fill #1

## 2019-12-08 MED FILL — !VIAGRA 25 MG TABLET: 25 | 30 days supply | Qty: 20 | Fill #0

## 2019-12-08 MED FILL — OMEGA-3 ETHYL ESTERS 1 GM C: 1 | 20 days supply | Qty: 60 | Fill #0

## 2019-12-08 NOTE — Telephone Encounter (Signed)
Requested medication (s) are due for refill today: yes  Requested medication (s) are on the active medication list: yes  Last refill:  11/01/2019  Future visit scheduled: no  Notes to clinic:this refill cannot be delegated    Requested Prescriptions  Pending Prescriptions Disp Refills   cyclobenzaprine (FLEXERIL) 10 MG tablet [Pharmacy Med Name: CYCLOBENZAPRINE 10 MG TAB 10 Tablet] 60 tablet 2    Sig: TAKE 1 TABLET BY MOUTH TWICE DAILY AS NEEDED FOR MUSCLE SPASMS.      Not Delegated - Analgesics:  Muscle Relaxants Failed - 12/08/2019  8:26 AM      Failed - This refill cannot be delegated      Passed - Valid encounter within last 6 months    Recent Outpatient Visits           1 month ago Type 2 diabetes mellitus with other specified complication, with long-term current use of insulin (HCC)   [facility] [place], [name], MD   6 months ago Gastroenteritis   [place] [place], [place], MD   9 months ago Type 2 diabetes mellitus with other specified complication, with long-term current use of insulin (HCC)   [facility] [name], MD   1 year ago Other constipation   [place] [facility] [place], [name], MD   1 year ago Diabetes mellitus due to underlying condition with hyperglycemia, with long-term current use of insulin [facility])   [place] [facility] [name], MD

## 2019-12-09 ENCOUNTER — Other Ambulatory Visit: Payer: Self-pay | Admitting: Pharmacist

## 2019-12-09 MED ORDER — TRUEPLUS 5-BEVEL PEN NEEDLES 32G X 4 MM MISC: 2 refills | Status: DC

## 2019-12-12 MED FILL — CYCLOBENZAPRINE 10 MG TAB: 10 | 30 days supply | Qty: 60 | Fill #0

## 2020-01-03 MED FILL — ?CARVEDILOL 25 MG TABLET: 25 | 30 days supply | Qty: 60 | Fill #1

## 2020-01-03 MED FILL — OMEGA-3 ETHYL ESTERS 1 GM C: 1 | 20 days supply | Qty: 60 | Fill #1

## 2020-01-03 MED FILL — ?GLIPIZIDE 10 MG TABLET: 10 | 30 days supply | Qty: 60 | Fill #1

## 2020-01-03 MED FILL — ?SPIRONOLACTONE 25 MG TABLE: 25 | 30 days supply | Qty: 30 | Fill #2

## 2020-01-03 MED FILL — AMLODIPINE BESYLATE 10 MG T: 10 | 30 days supply | Qty: 30 | Fill #2

## 2020-01-03 MED FILL — CYCLOBENZAPRINE 10 MG TAB: 10 | 30 days supply | Qty: 60 | Fill #0

## 2020-01-11 MED FILL — CYCLOBENZAPRINE 10 MG TAB: 10 | 30 days supply | Qty: 60 | Fill #0

## 2020-02-06 ENCOUNTER — Other Ambulatory Visit: Payer: Self-pay | Admitting: Family Medicine

## 2020-02-06 DIAGNOSIS — N528 Other male erectile dysfunction: Secondary | ICD-10-CM

## 2020-02-06 MED FILL — OMEGA-3 ETHYL ESTERS 1 GM C: 1 | 20 days supply | Qty: 60 | Fill #2

## 2020-02-06 MED FILL — AMLODIPINE BESYLATE 10 MG T: 10 | 30 days supply | Qty: 30 | Fill #3

## 2020-02-06 MED FILL — ?SPIRONOLACTONE 25 MG TABLE: 25 | 30 days supply | Qty: 30 | Fill #3

## 2020-02-06 MED FILL — CYCLOBENZAPRINE 10 MG TAB: 10 | 30 days supply | Qty: 60 | Fill #1

## 2020-02-06 MED FILL — !VIAGRA 25 MG TABLET: 25 | 30 days supply | Qty: 20 | Fill #0

## 2020-02-06 NOTE — Telephone Encounter (Signed)
Requested Prescriptions  Pending Prescriptions Disp Refills  . VIAGRA 25 MG tablet [Pharmacy Med Name: VIAGRA 25 MG TABLET 25 Tablet] 20 tablet 0    Sig: TAKE 2 TABLETS (50 MG TOTAL) BY MOUTH DAILY AS NEEDED FOR ERECTILE DYSFUNCTION. AT LEAST 24 HOURS BETWEEN DOSES     Urology: Erectile Dysfunction Agents Passed - 02/06/2020  3:41 PM      Passed - Last BP in normal range    BP Readings from Last 1 Encounters:  10/13/19 123/72         Passed - Valid encounter within last 12 months    Recent Outpatient Visits          3 months ago Type 2 diabetes mellitus with other specified complication, with long-term current use of insulin (HCC)   [facility] [place], [name], MD   8 months ago Gastroenteritis   [place] [place], [place], MD   11 months ago Type 2 diabetes mellitus with other specified complication, with long-term current use of insulin (HCC)   [facility] [name], MD   1 year ago Other constipation   [place] [facility] [place], [name], MD   1 year ago Diabetes mellitus due to underlying condition with hyperglycemia, with long-term current use of insulin [facility])   [place] [facility] [name], MD

## 2020-02-27 MED FILL — ?ATORVASTATIN 20 MG TABLET: 20 | 90 days supply | Qty: 90 | Fill #1

## 2020-02-27 MED FILL — ?GLIPIZIDE 10 MG TABLET: 10 | 30 days supply | Qty: 60 | Fill #2

## 2020-02-27 MED FILL — OMEGA-3 ETHYL ESTERS 1 GM C: 1 | 20 days supply | Qty: 60 | Fill #3

## 2020-02-27 MED FILL — LOSARTAN POTASSIUM 100 MG T: 100 | 30 days supply | Qty: 30 | Fill #0

## 2020-02-27 MED FILL — ?CARVEDILOL 25 MG TABLET: 25 | 30 days supply | Qty: 60 | Fill #2

## 2020-03-08 MED FILL — !VICTOZA 18MG/3ML INJECT: 18 | 30 days supply | Qty: 9 | Fill #1

## 2020-03-08 MED FILL — ?SPIRONOLACTONE 25 MG TABLE: 25 | 30 days supply | Qty: 30 | Fill #4

## 2020-03-08 MED FILL — AMLODIPINE BESYLATE 10 MG T: 10 | 30 days supply | Qty: 30 | Fill #4

## 2020-03-15 MED FILL — AMLODIPINE BESYLATE 10 MG T: 10 | 30 days supply | Qty: 30 | Fill #4

## 2020-03-15 MED FILL — ?SPIRONOLACTONE 25 MG TABLE: 25 | 30 days supply | Qty: 30 | Fill #4

## 2020-03-15 MED FILL — !VICTOZA 18MG/3ML INJECT: 18 | 30 days supply | Qty: 9 | Fill #1

## 2020-04-05 ENCOUNTER — Other Ambulatory Visit: Payer: Self-pay | Admitting: Family Medicine

## 2020-04-05 DIAGNOSIS — N528 Other male erectile dysfunction: Secondary | ICD-10-CM

## 2020-04-05 MED FILL — ?CARVEDILOL 25 MG TABLET: 25 | 30 days supply | Qty: 60 | Fill #3

## 2020-04-05 MED FILL — glipiZIDE 10 MG TABS: 10 | 30 days supply | Qty: 60 | Fill #3

## 2020-04-05 MED FILL — SILDENAFIL CITRATE 25 MG TA: 25 | 30 days supply | Qty: 20 | Fill #0

## 2020-04-05 MED FILL — ?SPIRONOLACTONE 25 MG TABLE: 25 | 30 days supply | Qty: 30 | Fill #5

## 2020-04-05 MED FILL — AMLODIPINE BESYLATE 10 MG T: 10 | 30 days supply | Qty: 30 | Fill #5

## 2020-04-05 MED FILL — OMEGA-3 ETHYL ESTERS 1 GM C: 1 | 20 days supply | Qty: 60 | Fill #4

## 2020-04-05 MED FILL — CYCLOBENZAPRINE 10 MG TAB: 10 | 30 days supply | Qty: 60 | Fill #2

## 2020-04-13 MED FILL — $VICTOZA 2-PAK 18MG/3ML PEN: 18 | 30 days supply | Qty: 9 | Fill #2

## 2020-04-23 ENCOUNTER — Ambulatory Visit: Payer: Self-pay | Admitting: Family Medicine

## 2020-04-27 MED FILL — OMEGA-3 ETHYL ESTERS 1 GM C: 1 | 20 days supply | Qty: 60 | Fill #5

## 2020-04-27 MED FILL — LOSARTAN POTASSIUM 100 MG T: 100 | 30 days supply | Qty: 30 | Fill #1

## 2020-05-11 ENCOUNTER — Other Ambulatory Visit: Payer: Self-pay | Admitting: Family Medicine

## 2020-05-11 DIAGNOSIS — I5022 Chronic systolic (congestive) heart failure: Secondary | ICD-10-CM

## 2020-05-11 DIAGNOSIS — G8929 Other chronic pain: Secondary | ICD-10-CM

## 2020-05-11 DIAGNOSIS — I10 Essential (primary) hypertension: Secondary | ICD-10-CM

## 2020-05-11 MED FILL — AMLODIPINE BESYLATE 10 MG T: 10 | 30 days supply | Qty: 30 | Fill #0

## 2020-05-11 MED FILL — ?SPIRONOLACTONE 25 MG TABLE: 25 | 30 days supply | Qty: 30 | Fill #0

## 2020-05-11 MED FILL — CYCLOBENZAPRINE 10 MG TAB: 10 | 30 days supply | Qty: 60 | Fill #0

## 2020-05-11 NOTE — Telephone Encounter (Signed)
Requested medication (s) are due for refill today:  yes  Requested medication (s) are on the active medication list: yes  Last refill:  04/05/2020  Future visit scheduled:yes  Notes to clinic: this refill cannot be delegated    Requested Prescriptions  Pending Prescriptions Disp Refills   cyclobenzaprine (FLEXERIL) 10 MG tablet [Pharmacy Med Name: CYCLOBENZAPRINE 10 MG TAB 10 Tablet] 60 tablet 0    Sig: TAKE 1 TABLET BY MOUTH TWICE DAILY AS NEEDED FOR MUSCLE SPASMS.      Not Delegated - Analgesics:  Muscle Relaxants Failed - 05/11/2020  9:22 AM      Failed - This refill cannot be delegated      Failed - Valid encounter within last 6 months    Recent Outpatient Visits           7 months ago Type 2 diabetes mellitus with other specified complication, with long-term current use of insulin (HCC)   [facility] [place], [name], MD   11 months ago Gastroenteritis   [place] [facility] [place], [place], MD   1 year ago Type 2 diabetes mellitus with other specified complication, with long-term current use of insulin (HCC)   [place] [name], MD   1 year ago Other constipation   [place] [facility] [place], [name], MD   2 years ago Diabetes mellitus due to underlying condition with hyperglycemia, with long-term current use of insulin [facility])   [place] [facility] [name], MD       Future Appointments             In 1 month [name], MD [facility]              Signed Prescriptions Disp Refills   amLODipine (NORVASC) 10 MG tablet 30 tablet 1    Sig: TAKE 1 TABLET (10 MG TOTAL) BY MOUTH DAILY.      Cardiovascular:  Calcium Channel Blockers Failed - 05/11/2020  9:22 AM      Failed - Valid encounter within last 6 months    Recent Outpatient Visits           7 months ago Type 2 diabetes mellitus with  other specified complication, with long-term current use of insulin (HCC)   [place] [facility] [place], [name], MD   11 months ago Gastroenteritis   [place] [facility] [place], [place], MD   1 year ago Type 2 diabetes mellitus with other specified complication, with long-term current use of insulin (HCC)   [place] [facility] [name], MD   1 year ago Other constipation   [place] [facility] [place], [name], MD   2 years ago Diabetes mellitus due to underlying condition with hyperglycemia, with long-term current use of insulin [facility])   [place] [facility] [name], MD       Future Appointments             In 1 month [name], MD [facility]             Passed - Last BP in normal range    BP Readings from Last 1 Encounters:  10/13/19 123/72            spironolactone (ALDACTONE) 25 MG tablet 30 tablet 1    Sig: TAKE  1 TABLET (25 MG TOTAL) BY MOUTH DAILY.      Cardiovascular: Diuretics - Aldosterone Antagonist Failed - 05/11/2020  9:22 AM      Failed - Valid encounter within last 6 months    Recent Outpatient Visits           7 months ago Type 2 diabetes mellitus with other specified complication, with long-term current use of insulin (HCC)   [place] [facility] [place], [name], MD   11 months ago Gastroenteritis   [place] [facility] [place], [place], MD   1 year ago Type 2 diabetes mellitus with other specified complication, with long-term current use of insulin (HCC)   [place] [facility] [place], [name], MD   1 year ago Other constipation   [place] [facility] [place], [name], MD   2 years ago Diabetes mellitus due to underlying condition with hyperglycemia, with long-term current use of insulin [facility])   [place] [facility] [name], MD       Future Appointments             In 1 month [name], MD [facility]             Passed - Cr in normal range and within 360 days    Creat  Date Value Ref Range Status  05/27/2016 0.84 0.60 - 1.35 mg/dL Final   Creatinine, Ser  Date Value Ref Range Status  10/13/2019 0.91 0.76 - 1.27 mg/dL Final   Creatinine, Urine  Date Value Ref Range Status  05/27/2016 371 (H) 20 - 370 mg/dL Final    Comment:    Result repeated and verified. Result confirmed by automatic dilution.           Passed - K in normal range and within 360 days    Potassium  Date Value Ref Range Status  10/13/2019 3.7 3.5 - 5.2 mmol/L Final          Passed - Na in normal range and within 360 days    Sodium  Date Value Ref Range Status  10/13/2019 141 134 - 144 mmol/L Final          Passed - Last BP in normal range    BP Readings from Last 1 Encounters:  10/13/19 123/72

## 2020-05-28 ENCOUNTER — Other Ambulatory Visit: Payer: Self-pay | Admitting: Family Medicine

## 2020-05-28 MED FILL — $VICTOZA 2-PAK 18MG/3ML PEN: 18 | 30 days supply | Qty: 9 | Fill #3

## 2020-05-28 MED FILL — ?glipiZIDE 10MG TABLETS: 10 | 30 days supply | Qty: 60 | Fill #4

## 2020-05-28 MED FILL — ?CARVEDILOL 25 MG TABLET: 25 | 30 days supply | Qty: 60 | Fill #4

## 2020-05-28 MED FILL — OMEGA-3 ETHYL ESTERS 1 GM C: 1 | 20 days supply | Qty: 60 | Fill #0

## 2020-05-28 MED FILL — LOSARTAN POTASSIUM 100 MG T: 100 | 30 days supply | Qty: 30 | Fill #2

## 2020-06-12 ENCOUNTER — Other Ambulatory Visit: Payer: Self-pay | Admitting: Family Medicine

## 2020-06-12 DIAGNOSIS — G8929 Other chronic pain: Secondary | ICD-10-CM

## 2020-06-12 DIAGNOSIS — E7800 Pure hypercholesterolemia, unspecified: Secondary | ICD-10-CM

## 2020-06-12 MED FILL — ?ATORVASTATIN 20 MG TABLET: 20 | 30 days supply | Qty: 30 | Fill #0

## 2020-06-12 NOTE — Telephone Encounter (Signed)
Requested Prescriptions  Pending Prescriptions Disp Refills  . atorvastatin (LIPITOR) 20 MG tablet [Pharmacy Med Name: ATORVASTATIN 20 MG TABLET 20 Tablet] 90 tablet 0    Sig: TAKE 1 TABLET (20 MG TOTAL) BY MOUTH DAILY.     Cardiovascular:  Antilipid - Statins Failed - 06/12/2020  2:22 PM      Failed - LDL in normal range and within 360 days    LDL Chol Calc (NIH)  Date Value Ref Range Status  10/13/2019 52 0 - 99 mg/dL Final         Failed - HDL in normal range and within 360 days    HDL  Date Value Ref Range Status  10/13/2019 28 (L) >39 mg/dL Final         Passed - Total Cholesterol in normal range and within 360 days    Cholesterol, Total  Date Value Ref Range Status  10/13/2019 100 100 - 199 mg/dL Final         Passed - Triglycerides in normal range and within 360 days    Triglycerides  Date Value Ref Range Status  10/13/2019 108 0 - 149 mg/dL Final         Passed - Patient is not pregnant      Passed - Valid encounter within last 12 months    Recent Outpatient Visits          8 months ago Type 2 diabetes mellitus with other specified complication, with long-term current use of insulin (HCC)   [place] [facility] [place], [name], MD   1 year ago Gastroenteritis   [place] [facility] [place], [place], MD   1 year ago Type 2 diabetes mellitus with other specified complication, with long-term current use of insulin (HCC)   [place] [facility] [place], [name], MD   1 year ago Other constipation   [place] [facility] [place], [name], MD   2 years ago Diabetes mellitus due to underlying condition with hyperglycemia, with long-term current use of insulin [facility])   [place] [facility] [name], MD      Future Appointments            In 1 week [name], MD [facility]           . cyclobenzaprine (FLEXERIL) 10 MG  tablet [Pharmacy Med Name: CYCLOBENZAPRINE 10 MG TAB 10 Tablet] 60 tablet     Sig: TAKE 1 TABLET BY MOUTH TWICE DAILY AS NEEDED FOR MUSCLE SPASMS.     Not Delegated - Analgesics:  Muscle Relaxants Failed - 06/12/2020  2:22 PM      Failed - This refill cannot be delegated      Failed - Valid encounter within last 6 months    Recent Outpatient Visits          8 months ago Type 2 diabetes mellitus with other specified complication, with long-term current use of insulin (HCC)   [place] [facility] [place], [name], MD   1 year ago Gastroenteritis   [place] [place], [name], MD   1 year ago Type 2 diabetes mellitus with other specified complication, with long-term current use of insulin (HCC)   [place] [facility] [name], MD   1 year ago Other constipation   [place] [facility] [name], MD   2 years ago Diabetes  mellitus due to underlying condition with hyperglycemia, with long-term current use of insulin [facility])   [place] [facility] [name], MD      Future Appointments            In 1 week [name], MD [facility]

## 2020-06-12 NOTE — Telephone Encounter (Signed)
Requested medication (s) are due for refill today: yes  Requested medication (s) are on the active medication list: yes  Last refill:  05/11/20  Future visit scheduled: yes  Notes to clinic:  med not delegated to NT to RF   Requested Prescriptions  Pending Prescriptions Disp Refills   cyclobenzaprine (FLEXERIL) 10 MG tablet [Pharmacy Med Name: CYCLOBENZAPRINE 10 MG TAB 10 Tablet] 60 tablet     Sig: TAKE 1 TABLET BY MOUTH TWICE DAILY AS NEEDED FOR MUSCLE SPASMS.      Not Delegated - Analgesics:  Muscle Relaxants Failed - 06/12/2020  2:22 PM      Failed - This refill cannot be delegated      Failed - Valid encounter within last 6 months    Recent Outpatient Visits           8 months ago Type 2 diabetes mellitus with other specified complication, with long-term current use of insulin (HCC)   [facility] [place], [name], MD   1 year ago Gastroenteritis   [facility] [place], [place], MD   1 year ago Type 2 diabetes mellitus with other specified complication, with long-term current use of insulin (HCC)   [facility] [name], MD   1 year ago Other constipation   [facility] [place], [name], MD   2 years ago Diabetes mellitus due to underlying condition with hyperglycemia, with long-term current use of insulin [facility])   [facility] [name], MD       Future Appointments             In 1 week [name], MD [facility]              Signed Prescriptions Disp Refills   atorvastatin (LIPITOR) 20 MG tablet 90 tablet 0    Sig: TAKE 1 TABLET (20 MG TOTAL) BY MOUTH DAILY.      Cardiovascular:  Antilipid - Statins Failed - 06/12/2020  2:22 PM      Failed - LDL in normal range and within 360 days    LDL Chol Calc (NIH)  Date Value Ref Range Status  10/13/2019 52 0 - 99 mg/dL Final           Failed - HDL in normal range and within 360 days    HDL  Date Value Ref Range Status  10/13/2019 28 (L) >39 mg/dL Final          Passed - Total Cholesterol in normal range and within 360 days    Cholesterol, Total  Date Value Ref Range Status  10/13/2019 100 100 - 199 mg/dL Final          Passed - Triglycerides in normal range and within 360 days    Triglycerides  Date Value Ref Range Status  10/13/2019 108 0 - 149 mg/dL Final          Passed - Patient is not pregnant      Passed - Valid encounter within last 12 months    Recent Outpatient Visits           8 months ago Type 2 diabetes mellitus with other specified complication, with long-term current use of insulin (HCC)   [place] [facility] [place], [name], MD   1 year ago Gastroenteritis   [place] [facility] [name], MD  1 year ago Type 2 diabetes mellitus with other specified complication, with long-term current use of insulin (HCC)   [place] [facility] [place], [name], MD   1 year ago Other constipation   [place] [facility] [place], [name], MD   2 years ago Diabetes mellitus due to underlying condition with hyperglycemia, with long-term current use of insulin [facility])   [place] [facility] [name], MD       Future Appointments             In 1 week [name], MD [facility]

## 2020-06-19 ENCOUNTER — Ambulatory Visit: Payer: Self-pay | Admitting: Family Medicine

## 2020-06-28 ENCOUNTER — Other Ambulatory Visit: Payer: Self-pay | Admitting: Family Medicine

## 2020-06-28 DIAGNOSIS — N528 Other male erectile dysfunction: Secondary | ICD-10-CM

## 2020-06-28 NOTE — Telephone Encounter (Signed)
   Future visit scheduled: no  Notes to clinic: Patient no show last appointment  Review for refill    Requested Prescriptions  Pending Prescriptions Disp Refills   VIAGRA 25 MG tablet [Pharmacy Med Name: VIAGRA 25 MG TABLET 25 Tablet] 20 tablet 0    Sig: TAKE 2 TABLETS (50 MG TOTAL) BY MOUTH DAILY AS NEEDED FOR ERECTILE DYSFUNCTION. AT LEAST 24 HOURS BETWEEN DOSES      Urology: Erectile Dysfunction Agents Passed - 06/28/2020  2:05 PM      Passed - Last BP in normal range    BP Readings from Last 1 Encounters:  10/13/19 123/72          Passed - Valid encounter within last 12 months    Recent Outpatient Visits           8 months ago Type 2 diabetes mellitus with other specified complication, with long-term current use of insulin (HCC)   [place] [facility] [place], [name], MD   1 year ago Gastroenteritis   [place] [facility] [place], [place], MD   1 year ago Type 2 diabetes mellitus with other specified complication, with long-term current use of insulin (HCC)   [place] [facility] [name], MD   1 year ago Other constipation   [place] [facility] [place], [name], MD   2 years ago Diabetes mellitus due to underlying condition with hyperglycemia, with long-term current use of insulin [facility])   [place] [facility] [name], MD

## 2020-06-30 ENCOUNTER — Other Ambulatory Visit: Payer: Self-pay

## 2020-07-01 ENCOUNTER — Other Ambulatory Visit: Payer: Self-pay

## 2020-07-01 MED FILL — Omega-3-acid Ethyl Esters Cap 1 GM: ORAL | 20 days supply | Qty: 60 | Fill #0 | Status: AC

## 2020-07-01 MED FILL — Losartan Potassium Tab 100 MG: ORAL | 30 days supply | Qty: 30 | Fill #0 | Status: AC

## 2020-07-02 ENCOUNTER — Other Ambulatory Visit: Payer: Self-pay | Admitting: Family Medicine

## 2020-07-02 ENCOUNTER — Other Ambulatory Visit: Payer: Self-pay

## 2020-07-02 DIAGNOSIS — I10 Essential (primary) hypertension: Secondary | ICD-10-CM

## 2020-07-02 DIAGNOSIS — I5022 Chronic systolic (congestive) heart failure: Secondary | ICD-10-CM

## 2020-07-02 MED FILL — Atorvastatin Calcium Tab 20 MG (Base Equivalent): ORAL | Qty: 90 | Fill #0 | Status: CN

## 2020-07-02 MED FILL — Liraglutide Soln Pen-injector 18 MG/3ML (6 MG/ML): SUBCUTANEOUS | 30 days supply | Qty: 9 | Fill #0 | Status: AC

## 2020-07-02 NOTE — Telephone Encounter (Signed)
Requested medications are due for refill today. yes  Requested medications are on the active medications list.  yes  Last refill. 05/11/2020  Future visit scheduled.   No  Notes to clinic.  Pt cancelled appointment on 04/23/2020 and no showed for appointment on 06/19/2020. Please advise.

## 2020-07-03 ENCOUNTER — Other Ambulatory Visit: Payer: Self-pay

## 2020-07-03 MED ORDER — SPIRONOLACTONE 25 MG PO TABS
25.0000 mg | ORAL_TABLET | Freq: Every day | ORAL | 0 refills | Status: DC
Start: 2020-07-03 — End: 2020-08-07
  Filled 2020-07-03: qty 30, 30d supply, fill #0

## 2020-07-03 MED ORDER — AMLODIPINE BESYLATE 10 MG PO TABS
10.0000 mg | ORAL_TABLET | Freq: Every day | ORAL | 0 refills | Status: DC
  Filled 2020-07-03: qty 30, 30d supply, fill #0

## 2020-07-05 ENCOUNTER — Other Ambulatory Visit: Payer: Self-pay

## 2020-07-19 MED FILL — Atorvastatin Calcium Tab 20 MG (Base Equivalent): ORAL | 30 days supply | Qty: 30 | Fill #0 | Status: AC

## 2020-07-19 MED FILL — Glipizide Tab 10 MG: ORAL | 30 days supply | Qty: 60 | Fill #0 | Status: AC

## 2020-07-19 MED FILL — Carvedilol Tab 25 MG: ORAL | 30 days supply | Qty: 60 | Fill #0 | Status: AC

## 2020-07-20 ENCOUNTER — Other Ambulatory Visit: Payer: Self-pay

## 2020-08-07 ENCOUNTER — Other Ambulatory Visit: Payer: Self-pay

## 2020-08-07 ENCOUNTER — Other Ambulatory Visit: Payer: Self-pay | Admitting: Family Medicine

## 2020-08-07 DIAGNOSIS — I10 Essential (primary) hypertension: Secondary | ICD-10-CM

## 2020-08-07 DIAGNOSIS — I5022 Chronic systolic (congestive) heart failure: Secondary | ICD-10-CM

## 2020-08-07 DIAGNOSIS — Z794 Long term (current) use of insulin: Secondary | ICD-10-CM

## 2020-08-07 DIAGNOSIS — N528 Other male erectile dysfunction: Secondary | ICD-10-CM

## 2020-08-07 DIAGNOSIS — E1169 Type 2 diabetes mellitus with other specified complication: Secondary | ICD-10-CM

## 2020-08-07 MED FILL — Losartan Potassium Tab 100 MG: ORAL | 30 days supply | Qty: 30 | Fill #1 | Status: AC

## 2020-08-07 MED FILL — Omega-3-acid Ethyl Esters Cap 1 GM: ORAL | 20 days supply | Qty: 60 | Fill #1 | Status: AC

## 2020-08-07 NOTE — Telephone Encounter (Signed)
Notes to clinic: Patient has appointment on 09/04/2020 Review for another refill    Requested Prescriptions  Pending Prescriptions Disp Refills   sildenafil (VIAGRA) 25 MG tablet 20 tablet 0    Sig: TAKE 2 TABLETS (50 MG TOTAL) BY MOUTH DAILY AS NEEDED FOR ERECTILE DYSFUNCTION. AT LEAST 24 HOURS BETWEEN DOSES      Urology: Erectile Dysfunction Agents Passed - 08/07/2020  9:13 AM      Passed - Last BP in normal range    BP Readings from Last 1 Encounters:  10/13/19 123/72          Passed - Valid encounter within last 12 months    Recent Outpatient Visits           9 months ago Type 2 diabetes mellitus with other specified complication, with long-term current use of insulin (HCC)   [place] [facility] [place], [name], MD   1 year ago Gastroenteritis   [place] [facility] [place], [place], MD   1 year ago Type 2 diabetes mellitus with other specified complication, with long-term current use of insulin (HCC)   [place] [facility] [place], [name], MD   2 years ago Other constipation   [place] [facility] [place], [name], MD   2 years ago Diabetes mellitus due to underlying condition with hyperglycemia, with long-term current use of insulin (HCC)   [place] [facility] [place], [name], MD       Future Appointments             In 4 weeks [name], MD [facility]               liraglutide (VICTOZA) 18 MG/3ML SOPN 9 mL 6    Sig: INJECT 0.3 MLS (1.8 MG TOTAL) INTO THE SKIN DAILY.      Endocrinology:  Diabetes - GLP-1 Receptor Agonists Failed - 08/07/2020  9:13 AM      Failed - HBA1C is between 0 and 7.9 and within 180 days    HbA1c, POC (controlled diabetic range)  Date Value Ref Range Status  10/13/2019 6.6 0.0 - 7.0 % Final          Failed - Valid encounter within last 6 months    Recent Outpatient Visits           9  months ago Type 2 diabetes mellitus with other specified complication, with long-term current use of insulin (HCC)   [place] [facility] [place], [name], MD   1 year ago Gastroenteritis   [place] [facility] [place], [place], MD   1 year ago Type 2 diabetes mellitus with other specified complication, with long-term current use of insulin (HCC)   [place] [facility] [name], MD   2 years ago Other constipation   [place] [facility] [place], [name], MD   2 years ago Diabetes mellitus due to underlying condition with hyperglycemia, with long-term current use of insulin [facility])   [place] [facility] [name], MD       Future Appointments             In 4 weeks [name], MD [facility]               spironolactone (ALDACTONE) 25 MG tablet 30 tablet 0    Sig: Take 1  tablet (25 mg total) by mouth daily. Must have office visit for refills      Cardiovascular: Diuretics - Aldosterone Antagonist Failed - 08/07/2020  9:13 AM      Failed - Valid encounter within last 6 months    Recent Outpatient Visits           9 months ago Type 2 diabetes mellitus with other specified complication, with long-term current use of insulin (HCC)   [place] [facility] [place], [name], MD   1 year ago Gastroenteritis   [place] [facility] [place], [place], MD   1 year ago Type 2 diabetes mellitus with other specified complication, with long-term current use of insulin (HCC)   [place] [facility] [place], [name], MD   2 years ago Other constipation   [place] [facility] [place], [name], MD   2 years ago Diabetes mellitus due to underlying condition with hyperglycemia, with long-term current use of insulin (HCC)   [place] [facility] [place],  [name], MD       Future Appointments             In 4 weeks [name], MD [facility]             Passed - Cr in normal range and within 360 days    Creat  Date Value Ref Range Status  05/27/2016 0.84 0.60 - 1.35 mg/dL Final   Creatinine, Ser  Date Value Ref Range Status  10/13/2019 0.91 0.76 - 1.27 mg/dL Final   Creatinine, Urine  Date Value Ref Range Status  05/27/2016 371 (H) 20 - 370 mg/dL Final    Comment:    Result repeated and verified. Result confirmed by automatic dilution.           Passed - K in normal range and within 360 days    Potassium  Date Value Ref Range Status  10/13/2019 3.7 3.5 - 5.2 mmol/L Final          Passed - Na in normal range and within 360 days    Sodium  Date Value Ref Range Status  10/13/2019 141 134 - 144 mmol/L Final          Passed - Last BP in normal range    BP Readings from Last 1 Encounters:  10/13/19 123/72            amLODipine (NORVASC) 10 MG tablet 30 tablet 0    Sig: Take 1 tablet (10 mg total) by mouth daily. Must have office visit for refills      Cardiovascular:  Calcium Channel Blockers Failed - 08/07/2020  9:13 AM      Failed - Valid encounter within last 6 months    Recent Outpatient Visits           9 months ago Type 2 diabetes mellitus with other specified complication, with long-term current use of insulin (HCC)   [place] [place] [place], [name], MD   1 year ago Gastroenteritis   [place] [facility] [place], [name], MD   1 year ago Type 2 diabetes mellitus with other specified complication, with long-term current use of insulin [facility])    [facility] [name], MD   2 years ago Other constipation    [facility] [place], [name], MD   2 years ago Diabetes mellitus due to  underlying condition with hyperglycemia, with long-term current use of insulin  [facility])   [place] [facility] [name], MD       Future Appointments             In 4 weeks [name], MD [facility]             Passed - Last BP in normal range    BP Readings from Last 1 Encounters:  10/13/19 123/72

## 2020-08-08 ENCOUNTER — Other Ambulatory Visit: Payer: Self-pay

## 2020-08-08 MED ORDER — VICTOZA 18 MG/3ML ~~LOC~~ SOPN
PEN_INJECTOR | Freq: Every day | SUBCUTANEOUS | 0 refills | Status: DC
  Filled 2020-08-08: qty 9, 30d supply, fill #0

## 2020-08-08 MED ORDER — AMLODIPINE BESYLATE 10 MG PO TABS
10.0000 mg | ORAL_TABLET | Freq: Every day | ORAL | 0 refills | Status: DC
  Filled 2020-08-08: qty 30, 30d supply, fill #0

## 2020-08-08 MED ORDER — SPIRONOLACTONE 25 MG PO TABS
25.0000 mg | ORAL_TABLET | Freq: Every day | ORAL | 0 refills | Status: DC
  Filled 2020-08-08: qty 30, 30d supply, fill #0

## 2020-08-09 ENCOUNTER — Other Ambulatory Visit: Payer: Self-pay

## 2020-08-10 ENCOUNTER — Other Ambulatory Visit: Payer: Self-pay

## 2020-08-22 ENCOUNTER — Other Ambulatory Visit: Payer: Self-pay

## 2020-08-22 MED FILL — Atorvastatin Calcium Tab 20 MG (Base Equivalent): ORAL | 30 days supply | Qty: 30 | Fill #1 | Status: CN

## 2020-08-29 ENCOUNTER — Other Ambulatory Visit: Payer: Self-pay

## 2020-08-30 ENCOUNTER — Other Ambulatory Visit: Payer: Self-pay | Admitting: Family Medicine

## 2020-08-30 DIAGNOSIS — N528 Other male erectile dysfunction: Secondary | ICD-10-CM

## 2020-08-30 DIAGNOSIS — E7800 Pure hypercholesterolemia, unspecified: Secondary | ICD-10-CM

## 2020-08-30 DIAGNOSIS — Z794 Long term (current) use of insulin: Secondary | ICD-10-CM

## 2020-08-30 DIAGNOSIS — E1169 Type 2 diabetes mellitus with other specified complication: Secondary | ICD-10-CM

## 2020-08-30 DIAGNOSIS — I10 Essential (primary) hypertension: Secondary | ICD-10-CM

## 2020-08-30 NOTE — Telephone Encounter (Signed)
Requested medication (s) are due for refill today: yes   Requested medication (s) are on the active medication list: yes  Last refill:  lovaza- 08/08/20-08/08/21 #9 ml 0 refills. Coreg- 10/13/19-10/12/20 #180 1 refill, glucotrol 10/13/19-/10/12/20 #180 1 refill , viagra- 04/05/20-04/05/21 #20 0 refills   Future visit scheduled: yes in 5 days   Notes to clinic:  epic automation     Requested Prescriptions  Pending Prescriptions Disp Refills   omega-3 acid ethyl esters (LOVAZA) 1 g capsule 60 capsule 2    Sig: TAKE 3 CAPSULES (3,000 MG TOTAL) BY MOUTH DAILY.      Endocrinology:  Nutritional Agents Passed - 08/30/2020  1:37 PM      Passed - Valid encounter within last 12 months    Recent Outpatient Visits           10 months ago Type 2 diabetes mellitus with other specified complication, with long-term current use of insulin (HCC)   [place] [facility] [place], [name], MD   1 year ago Gastroenteritis   [place] [facility] [employer], [place], MD   1 year ago Type 2 diabetes mellitus with other specified complication, with long-term current use of insulin (HCC)   [place] [facility] [name], MD   2 years ago Other constipation   [place] [facility] [place], [name], MD   2 years ago Diabetes mellitus due to underlying condition with hyperglycemia, with long-term current use of insulin (HCC)   [place] [facility] [name], MD       Future Appointments             In 5 days [name], MD [facility]               carvedilol (COREG) 25 MG tablet 180 tablet 1    Sig: TAKE 1 TABLET BY MOUTH 2 TIMES DAILY WITH A MEAL.      Cardiovascular:  Beta Blockers Failed - 08/30/2020  1:37 PM      Failed - Valid encounter within last 6 months    Recent Outpatient Visits           10 months ago Type 2 diabetes mellitus with other specified  complication, with long-term current use of insulin (HCC)   [place] [facility] [place], [name], MD   1 year ago Gastroenteritis   [place] [facility] [place], [place], MD   1 year ago Type 2 diabetes mellitus with other specified complication, with long-term current use of insulin (HCC)   [place] [facility] [name], MD   2 years ago Other constipation   [place] [facility] [place], [name], MD   2 years ago Diabetes mellitus due to underlying condition with hyperglycemia, with long-term current use of insulin [facility])    [facility] [name], MD       Future Appointments             In 5 days [name], MD [facility]             Passed - Last BP in normal range    BP Readings from Last 1 Encounters:  10/13/19 123/72          Passed - Last Heart Rate in normal range    Pulse Readings from Last 1 Encounters:  10/13/19 63            glipiZIDE (GLUCOTROL) 10 MG tablet 180 tablet 1    Sig: TAKE 1 TABLET BY MOUTH 2 TIMES DAILY BEFORE A MEAL.      Endocrinology:  Diabetes - Sulfonylureas Failed - 08/30/2020  1:37 PM      Failed - HBA1C is between 0 and 7.9 and within 180 days    HbA1c, POC (controlled diabetic range)  Date Value Ref Range Status  10/13/2019 6.6 0.0 - 7.0 % Final          Failed - Valid encounter within last 6 months    Recent Outpatient Visits           10 months ago Type 2 diabetes mellitus with other specified complication, with long-term current use of insulin (HCC)   [place] [facility] [place], [name], MD   1 year ago Gastroenteritis   [place] [facility] [place], [place], MD   1 year ago Type 2 diabetes mellitus with other specified complication, with long-term current use of insulin (HCC)   [place] [facility] [place],  [name], MD   2 years ago Other constipation   [place] [facility] [place], [name], MD   2 years ago Diabetes mellitus due to underlying condition with hyperglycemia, with long-term current use of insulin (HCC)   [place] [facility] [name], MD       Future Appointments             In 5 days [name], MD [facility]               sildenafil (VIAGRA) 25 MG tablet 20 tablet 0    Sig: TAKE 2 TABLETS (50 MG TOTAL) BY MOUTH DAILY AS NEEDED FOR ERECTILE DYSFUNCTION. AT LEAST 24 HOURS BETWEEN DOSES      Urology: Erectile Dysfunction Agents Passed - 08/30/2020  1:37 PM      Passed - Last BP in normal range    BP Readings from Last 1 Encounters:  10/13/19 123/72          Passed - Valid encounter within last 12 months    Recent Outpatient Visits           10 months ago Type 2 diabetes mellitus with other specified complication, with long-term current use of insulin (HCC)   [place] [facility] [place], [name], MD   1 year ago Gastroenteritis   [place] [facility] [place], [place], MD   1 year ago Type 2 diabetes mellitus with other specified complication, with long-term current use of insulin (HCC)   [place] [facility] [name], MD   2 years ago Other constipation   [place] [facility] [place], [name], MD   2 years ago Diabetes mellitus due to underlying condition with hyperglycemia, with long-term current use of insulin [facility])   [place] [facility] [name], MD       Future Appointments             In 5 days [name], MD [facility]

## 2020-08-31 ENCOUNTER — Other Ambulatory Visit: Payer: Self-pay

## 2020-08-31 MED ORDER — CARVEDILOL 25 MG PO TABS
ORAL_TABLET | Freq: Two times a day (BID) | ORAL | 1 refills | Status: DC
  Filled 2020-08-31: qty 60, 30d supply, fill #0

## 2020-08-31 MED ORDER — SILDENAFIL CITRATE 25 MG PO TABS
ORAL_TABLET | ORAL | 0 refills | Status: DC
  Filled 2020-08-31: qty 20, 30d supply, fill #0

## 2020-08-31 MED ORDER — GLIPIZIDE 10 MG PO TABS
ORAL_TABLET | ORAL | 1 refills | Status: DC
  Filled 2020-08-31: qty 60, 30d supply, fill #0
  Filled 2020-10-09: qty 60, 30d supply, fill #1
  Filled 2020-11-23: qty 60, 30d supply, fill #2
  Filled 2021-01-11: qty 60, 30d supply, fill #3
  Filled 2021-03-04: qty 60, 30d supply, fill #4
  Filled 2021-04-18: qty 60, 30d supply, fill #0
  Filled 2021-04-18: qty 60, 30d supply, fill #5

## 2020-08-31 MED ORDER — OMEGA-3-ACID ETHYL ESTERS 1 G PO CAPS
ORAL_CAPSULE | ORAL | 2 refills | Status: DC
  Filled 2020-08-31: qty 60, 20d supply, fill #0
  Filled 2020-09-20 – 2020-10-09 (×2): qty 60, 20d supply, fill #1
  Filled 2020-11-12: qty 60, 20d supply, fill #2

## 2020-09-03 ENCOUNTER — Telehealth: Payer: Self-pay | Admitting: Family Medicine

## 2020-09-03 NOTE — Telephone Encounter (Signed)
Patient appt on 6/7 at 9:10 with Dr. Alvis Lemmings will be virtual. Called patient no answer. Left vm that appt is virtual and if in person is preferred to call [phone] to reschedule.

## 2020-09-04 ENCOUNTER — Ambulatory Visit: Payer: Self-pay | Attending: Family Medicine | Admitting: Family Medicine

## 2020-09-04 ENCOUNTER — Other Ambulatory Visit: Payer: Self-pay

## 2020-09-04 ENCOUNTER — Encounter: Payer: Self-pay | Admitting: Family Medicine

## 2020-09-04 DIAGNOSIS — I5022 Chronic systolic (congestive) heart failure: Secondary | ICD-10-CM

## 2020-09-04 DIAGNOSIS — I10 Essential (primary) hypertension: Secondary | ICD-10-CM

## 2020-09-04 DIAGNOSIS — Z794 Long term (current) use of insulin: Secondary | ICD-10-CM

## 2020-09-04 DIAGNOSIS — E1169 Type 2 diabetes mellitus with other specified complication: Secondary | ICD-10-CM

## 2020-09-04 DIAGNOSIS — E7800 Pure hypercholesterolemia, unspecified: Secondary | ICD-10-CM

## 2020-09-04 DIAGNOSIS — Z1211 Encounter for screening for malignant neoplasm of colon: Secondary | ICD-10-CM

## 2020-09-04 MED ORDER — AMLODIPINE BESYLATE 10 MG PO TABS
10.0000 mg | ORAL_TABLET | Freq: Every day | ORAL | 1 refills | Status: DC
  Filled 2020-09-04: qty 30, 30d supply, fill #0
  Filled 2020-10-09: qty 30, 30d supply, fill #1
  Filled 2020-11-09: qty 30, 30d supply, fill #2
  Filled 2020-12-06 – 2020-12-14 (×2): qty 30, 30d supply, fill #3
  Filled 2021-01-11: qty 30, 30d supply, fill #4
  Filled 2021-02-05: qty 30, 30d supply, fill #5

## 2020-09-04 MED ORDER — VICTOZA 18 MG/3ML ~~LOC~~ SOPN
PEN_INJECTOR | Freq: Every day | SUBCUTANEOUS | 6 refills | Status: DC
  Filled 2020-09-04: qty 27, 90d supply, fill #0
  Filled 2020-09-04: qty 9, 28d supply, fill #0
  Filled 2020-12-11: qty 27, 90d supply, fill #1
  Filled 2021-03-04: qty 9, 30d supply, fill #2

## 2020-09-04 MED ORDER — CARVEDILOL 25 MG PO TABS
ORAL_TABLET | Freq: Two times a day (BID) | ORAL | 1 refills | Status: DC
  Filled 2020-09-04: qty 180, fill #0
  Filled 2020-10-09: qty 60, 30d supply, fill #0
  Filled 2020-11-23: qty 60, 30d supply, fill #1
  Filled 2021-01-11: qty 60, 30d supply, fill #2
  Filled 2021-03-04: qty 60, 30d supply, fill #3
  Filled 2021-04-18: qty 60, 30d supply, fill #4
  Filled 2021-04-18: qty 60, 30d supply, fill #0

## 2020-09-04 MED ORDER — LOSARTAN POTASSIUM 100 MG PO TABS
ORAL_TABLET | Freq: Every day | ORAL | 1 refills | Status: DC
  Filled 2020-09-04: qty 30, 30d supply, fill #0
  Filled 2020-10-11 – 2020-10-25 (×2): qty 30, 30d supply, fill #1
  Filled 2020-11-23: qty 30, 30d supply, fill #2
  Filled 2020-12-31: qty 30, 30d supply, fill #3
  Filled 2021-02-05: qty 30, 30d supply, fill #4
  Filled 2021-03-04: qty 30, 30d supply, fill #5

## 2020-09-04 MED ORDER — SPIRONOLACTONE 25 MG PO TABS
25.0000 mg | ORAL_TABLET | Freq: Every day | ORAL | 1 refills | Status: DC
  Filled 2020-09-04: qty 30, 30d supply, fill #0
  Filled 2020-10-09: qty 30, 30d supply, fill #1
  Filled 2020-11-12: qty 30, 30d supply, fill #2
  Filled 2020-12-06 – 2020-12-14 (×2): qty 30, 30d supply, fill #3
  Filled 2021-01-11: qty 30, 30d supply, fill #4
  Filled 2021-02-05: qty 30, 30d supply, fill #5

## 2020-09-04 MED ORDER — ATORVASTATIN CALCIUM 20 MG PO TABS
ORAL_TABLET | Freq: Every day | ORAL | 1 refills | Status: DC
  Filled 2020-09-04: qty 30, 30d supply, fill #0
  Filled 2020-10-09: qty 30, 30d supply, fill #1
  Filled 2020-11-12: qty 30, 30d supply, fill #2
  Filled 2020-12-14: qty 30, 30d supply, fill #3
  Filled 2021-01-11: qty 30, 30d supply, fill #4
  Filled 2021-02-05: qty 30, 30d supply, fill #5

## 2020-09-04 NOTE — Progress Notes (Signed)
Virtual Visit via Telephone Note  I connected with Dave Arnold, on 09/04/2020 at 12:44 PM by telephone due to the COVID-19 pandemic and verified that I am speaking with the correct person using two identifiers.   Consent: I discussed the limitations, risks, security and privacy concerns of performing an evaluation and management service by telephone and the availability of in person appointments. I also discussed with the patient that there may be a patient responsible charge related to this service. The patient expressed understanding and agreed to proceed.   Location of Patient: Environmental education officer of Provider: Clinic   Persons participating in Telemedicine visit: Dave Arnold Dr. Margarita Rana     History of Present Illness: Dave Arnold is a 50 year old male with a history of type 2 diabetes mellitus (A1c6.6), hypertension, hyperlipidemia, CHF (EF 50-55%from 06/2013), obesity whois seenfor a follow-up visit.   He endorses compliance with his medications and denies hypoglycemic episodes.  He checks his blood pressures at home and systolics have been under 140.  He has no hypoglycemia, numbness in extremities or visual concerns. With regards to his CHF he has no dyspnea, pedal edema or weight gain.  I had ordered an echocardiogram for him in 09/2019 but he never followed through with this and is yet to undergo this. Compliant with his statin. He is due for blood work and is also requesting refills of his medications.  Past Medical History:  Diagnosis Date  . Diabetes mellitus without complication ([place])   . Heart failure of unknown type ([place]) 06/2011  . Hypertension   . Obesity   . Thrombocytopenia (HCC)    Allergies  Allergen Reactions  . Lisinopril     cough    Current Outpatient Medications on File Prior to Visit  Medication Sig Dispense Refill  . aspirin EC 81 MG tablet Take 81 mg by mouth daily.     . clotrimazole (LOTRIMIN) 1 % cream Apply 1 application  topically 2 (two) times daily. (Patient not taking: Reported on 06/06/2019) 30 g 0  . Continuous Blood Gluc Receiver (FREESTYLE LIBRE 14 DAY READER) DEVI 1 Device by Does not apply route 3 (three) times daily. (Patient not taking: Reported on 10/13/2019) 1 each 0  . Continuous Blood Gluc Sensor (FREESTYLE LIBRE 14 DAY SENSOR) MISC 1 Device by Does not apply route every 14 (fourteen) days. Use to check blood sugar daily. Use one sensor for 14 days. (Patient not taking: Reported on 10/13/2019) 2 each 6  . cyclobenzaprine (FLEXERIL) 10 MG tablet TAKE 1 TABLET BY MOUTH TWICE DAILY AS NEEDED FOR MUSCLE SPASMS. 60 tablet 0  . EPINEPHrine (EPIPEN 2-PAK) 0.3 mg/0.3 mL IJ SOAJ injection Inject 0.3 mLs (0.3 mg total) into the muscle once. 1 Device 0  . glipiZIDE (GLUCOTROL) 10 MG tablet TAKE 1 TABLET BY MOUTH 2 TIMES DAILY BEFORE A MEAL. 180 tablet 1  . ibuprofen (ADVIL,MOTRIN) 600 MG tablet Take 1 tablet (600 mg total) by mouth every 6 (six) hours as needed. (Patient not taking: Reported on 01/19/2018) 30 tablet 0  . insulin isophane & regular human (HUMULIN 70/30 KWIKPEN) (70-30) 100 UNIT/ML KwikPen Inject 50 Units into the skin 2 (two) times daily. 120 mL 3  . Insulin Pen Needle (TRUEPLUS 5-BEVEL PEN NEEDLES) 32G X 4 MM MISC Use as instructed. 100 each 2  . Insulin Syringe-Needle U-100 (BD INSULIN SYRINGE ULTRAFINE) 31G X 15/64" 0.5 ML MISC 1 each by Does not apply route 2 (two) times daily. 100 each 5  .  omega-3 acid ethyl esters (LOVAZA) 1 g capsule TAKE 3 CAPSULES (3,000 MG TOTAL) BY MOUTH DAILY. 60 capsule 2  . Omega-3 Fatty Acids (FISH OIL) 1000 MG CAPS Take 3 capsules (3,000 mg total) by mouth daily. 100 capsule 5  . sildenafil (VIAGRA) 25 MG tablet TAKE 2 TABLETS (50 MG TOTAL) BY MOUTH DAILY AS NEEDED FOR ERECTILE DYSFUNCTION. AT LEAST 24 HOURS BETWEEN DOSES 20 tablet 0   No current facility-administered medications on file prior to visit.    ROS: See HPI  Observations/Objective: Awake, alert, and  2x3 Not in acute distress  CMP Latest Ref Rng & Units 10/13/2019 03/09/2019 04/21/2018  Glucose 65 - 99 mg/dL 137(H) 172(H) 173(H)  BUN 6 - 24 mg/dL _0 Creatinine 0.76 - 1.27 mg/dL 0.91 0.97 0.82  Sodium 134 - 144 mmol/L 141 143 147(H)  Potassium 3.5 - 5.2 mmol/L 3.7 3.8 3.7  Chloride 96 - 106 mmol/L 103 103 103  CO2 20 - 29 mmol/L _1 Calcium 8.7 - 10.2 mg/dL 9.3 9.7 9.4  Total Protein 6.0 - 8.5 g/dL 7.4 - -  Total Bilirubin 0.0 - 1.2 mg/dL 0.5 - -  Alkaline Phos 48 - 121 IU/L 95 - -  AST 0 - 40 IU/L 19 - -  ALT 0 - 44 IU/L 17 - -    Lipid Panel     Component Value Date/Time   CHOL 100 10/13/2019 1100   TRIG 108 10/13/2019 1100   HDL 28 (L) 10/13/2019 1100   CHOLHDL 3.9 03/09/2019 1612   CHOLHDL 5.6 (H) 05/27/2016 1024   VLDL 46 (H) 04/26/2015 1054   LDLCALC 52 10/13/2019 1100   LABVLDL 20 10/13/2019 1100    Lab Results  Component Value Date   HGBA1C 6.6 10/13/2019    Assessment and Plan: 1. Essential hypertension Controlled Continue current regimen Counseled on blood pressure goal of less than 130/80, low-sodium, DASH diet, medication compliance, 150 minutes of moderate intensity exercise per week. Discussed medication compliance, adverse effects. - CMP14+EGFR; Future - Lipid panel; Future - amLODipine (NORVASC) 10 MG tablet; Take 1 tablet (10 mg total) by mouth daily.  Dispense: 90 tablet; Refill: 1 - carvedilol (COREG) 25 MG tablet; TAKE 1 TABLET BY MOUTH 2 TIMES DAILY WITH A MEAL.  Dispense: 180 tablet; Refill: 1 - losartan (COZAAR) 100 MG tablet; TAKE 1 TABLET (100 MG TOTAL) BY MOUTH DAILY.  Dispense: 90 tablet; Refill: 1 - spironolactone (ALDACTONE) 25 MG tablet; Take 1 tablet (25 mg total) by mouth daily.  Dispense: 90 tablet; Refill: 1  2. Pure hypercholesterolemia Controlled Low-cholesterol diet Lipid panel has been ordered - atorvastatin (LIPITOR) 20 MG tablet; TAKE 1 TABLET (20 MG TOTAL) BY MOUTH DAILY.  Dispense: 90 tablet; Refill: 1  3.  Type 2 diabetes mellitus with other specified complication, with long-term current use of insulin (HCC) Controlled with A1c of 6.6 Lisinopril Adjust regimen accordingly after A1c is obtained Counseled on Diabetic diet, my plate method, 622 minutes of moderate intensity exercise/week Blood sugar logs with fasting goals of 80-120 mg/dl, random of less than 180 and in the event of sugars less than 60 mg/dl or greater than 400 mg/dl encouraged to notify the clinic. Advised on the need for annual eye exams, annual foot exams, Pneumonia vaccine. - Hemoglobin A1c; Future - liraglutide (VICTOZA) 18 MG/3ML SOPN; INJECT 0.3 MLS (1.8 MG TOTAL) INTO THE SKIN DAILY.  Dispense: 9 mL; Refill: 6  4. Chronic systolic congestive heart failure (HCC) EF of  50 to 55% from 2015 Euvolemic Due for repeat echo and this was ordered last year but he never followed through with it Continue current medication - spironolactone (ALDACTONE) 25 MG tablet; Take 1 tablet (25 mg total) by mouth daily.  Dispense: 90 tablet; Refill: 1  5. Screening for colon cancer - Fecal occult blood, imunochemical([facility]); Future   Follow Up Instructions: 6 months for chronic disease management   I discussed the assessment and treatment plan with the patient. The patient was provided an opportunity to ask questions and all were answered. The patient agreed with the plan and demonstrated an understanding of the instructions.   The patient was advised to call back or seek an in-person evaluation if the symptoms worsen or if the condition fails to improve as anticipated.     I provided 15 minutes total of non-face-to-face time during this encounter.   Charlott Rakes, MD, FAAFP. [facility] and [place], [place]   09/04/2020, 12:44 PM

## 2020-09-05 ENCOUNTER — Ambulatory Visit: Payer: Self-pay | Attending: Family Medicine

## 2020-09-05 ENCOUNTER — Other Ambulatory Visit: Payer: Self-pay | Admitting: Family Medicine

## 2020-09-05 ENCOUNTER — Other Ambulatory Visit: Payer: Self-pay

## 2020-09-05 DIAGNOSIS — I10 Essential (primary) hypertension: Secondary | ICD-10-CM

## 2020-09-05 DIAGNOSIS — E1169 Type 2 diabetes mellitus with other specified complication: Secondary | ICD-10-CM

## 2020-09-06 LAB — LIPID PANEL
Chol/HDL Ratio: 5.1 ratio — ABNORMAL HIGH (ref 0.0–5.0)
Cholesterol, Total: 133 mg/dL (ref 100–199)
HDL: 26 mg/dL — ABNORMAL LOW (ref >39)
LDL Chol Calc (NIH): 82 mg/dL (ref 0–99)
Triglycerides: 138 mg/dL (ref 0–149)
VLDL Cholesterol Cal: 25 mg/dL (ref 5–40)

## 2020-09-06 LAB — CMP14+EGFR
ALT: 15 IU/L (ref 0–44)
AST: 15 IU/L (ref 0–40)
Albumin/Globulin Ratio: 1.5 (ref 1.2–2.2)
Albumin: 4.1 g/dL (ref 4.0–5.0)
Alkaline Phosphatase: 87 IU/L (ref 44–121)
BUN/Creatinine Ratio: 10 (ref 9–20)
BUN: 9 mg/dL (ref 6–24)
Bilirubin Total: 0.6 mg/dL (ref 0.0–1.2)
CO2: 25 mmol/L (ref 20–29)
Calcium: 9 mg/dL (ref 8.7–10.2)
Chloride: 106 mmol/L (ref 96–106)
Creatinine, Ser: 0.93 mg/dL (ref 0.76–1.27)
Globulin, Total: 2.7 g/dL (ref 1.5–4.5)
Glucose: 140 mg/dL — ABNORMAL HIGH (ref 65–99)
Potassium: 3.4 mmol/L — ABNORMAL LOW (ref 3.5–5.2)
Sodium: 145 mmol/L — ABNORMAL HIGH (ref 134–144)
Total Protein: 6.8 g/dL (ref 6.0–8.5)
eGFR: 102 mL/min/{1.73_m2} (ref >59)

## 2020-09-06 LAB — HEMOGLOBIN A1C
Est. average glucose Bld gHb Est-mCnc: 151 mg/dL
Hgb A1c MFr Bld: 6.9 % — ABNORMAL HIGH (ref 4.8–5.6)

## 2020-09-07 ENCOUNTER — Telehealth: Payer: Self-pay

## 2020-09-07 NOTE — Telephone Encounter (Signed)
-----   Message from [name], MD sent at 09/07/2020  9:22 AM EDT ----- Please inform him his A1c is great at 6.9 and he is doing a good job.  Total cholesterol is normal.  Good cholesterol (HDL) is a little.  This can be increased by increasing physical activity and exercise.  Potassium is slightly low and can be increased by increasing ingestion of high potassium foods like bananas.

## 2020-09-07 NOTE — Telephone Encounter (Signed)
Patient name and DOB has been verified Patient was informed of lab results. Patient had no questions.  

## 2020-09-11 ENCOUNTER — Other Ambulatory Visit: Payer: Self-pay

## 2020-09-20 ENCOUNTER — Other Ambulatory Visit: Payer: Self-pay

## 2020-09-24 ENCOUNTER — Other Ambulatory Visit: Payer: Self-pay

## 2020-09-27 ENCOUNTER — Other Ambulatory Visit: Payer: Self-pay

## 2020-10-09 ENCOUNTER — Other Ambulatory Visit: Payer: Self-pay

## 2020-10-09 ENCOUNTER — Other Ambulatory Visit: Payer: Self-pay | Admitting: Family Medicine

## 2020-10-09 DIAGNOSIS — G8929 Other chronic pain: Secondary | ICD-10-CM

## 2020-10-09 DIAGNOSIS — M546 Pain in thoracic spine: Secondary | ICD-10-CM

## 2020-10-09 NOTE — Telephone Encounter (Signed)
Requested medication (s) are due for refill today: yes  Requested medication (s) are on the active medication list: yes  Last refill:  06/12/20 #60  Future visit scheduled: no  Notes to clinic:  med not delegated to NT to RF    Requested Prescriptions  Pending Prescriptions Disp Refills   cyclobenzaprine (FLEXERIL) 10 MG tablet 60 tablet 0    Sig: Take by mouth 2 (two) times daily as needed for muscle spasms.      Not Delegated - Analgesics:  Muscle Relaxants Failed - 10/09/2020  3:56 PM      Failed - This refill cannot be delegated      Passed - Valid encounter within last 6 months    Recent Outpatient Visits           1 month ago Screening for colon cancer   [facility] [place], [place], MD   12 months ago Type 2 diabetes mellitus with other specified complication, with long-term current use of insulin (HCC)   [facility] [place], [name], MD   1 year ago Gastroenteritis   [facility] [place], [name], MD   1 year ago Type 2 diabetes mellitus with other specified complication, with long-term current use of insulin (HCC)   [facility] [name], MD   2 years ago Other constipation   [place] [name], MD

## 2020-10-10 ENCOUNTER — Other Ambulatory Visit: Payer: Self-pay

## 2020-10-10 MED ORDER — CYCLOBENZAPRINE HCL 10 MG PO TABS
ORAL_TABLET | Freq: Two times a day (BID) | ORAL | 0 refills | Status: DC | PRN
Start: 2020-10-10 — End: 2020-12-14
  Filled 2020-10-10: qty 60, 30d supply, fill #0

## 2020-10-11 ENCOUNTER — Other Ambulatory Visit: Payer: Self-pay

## 2020-10-12 ENCOUNTER — Other Ambulatory Visit: Payer: Self-pay

## 2020-10-19 ENCOUNTER — Other Ambulatory Visit: Payer: Self-pay

## 2020-10-25 ENCOUNTER — Other Ambulatory Visit: Payer: Self-pay

## 2020-11-09 ENCOUNTER — Other Ambulatory Visit: Payer: Self-pay

## 2020-11-12 ENCOUNTER — Other Ambulatory Visit: Payer: Self-pay

## 2020-11-14 ENCOUNTER — Other Ambulatory Visit: Payer: Self-pay

## 2020-11-23 ENCOUNTER — Other Ambulatory Visit: Payer: Self-pay

## 2020-11-29 ENCOUNTER — Other Ambulatory Visit: Payer: Self-pay

## 2020-12-06 ENCOUNTER — Other Ambulatory Visit: Payer: Self-pay | Admitting: Family Medicine

## 2020-12-06 ENCOUNTER — Other Ambulatory Visit: Payer: Self-pay

## 2020-12-06 NOTE — Telephone Encounter (Signed)
Requested medications are due for refill today yes, was given 60 instead of 90 for 3 times a day  Requested medications are on the active medication list yes  Last refill 8/17 (only given 60 instead of 90)  Last visit 08/2020  Future visit scheduled no  Notes to clinic Has a Fish Oil 1 gm 3 a day ordered as well as the Lovaza, please assess.

## 2020-12-07 ENCOUNTER — Other Ambulatory Visit: Payer: Self-pay

## 2020-12-07 MED ORDER — OMEGA-3-ACID ETHYL ESTERS 1 G PO CAPS
3.0000 g | ORAL_CAPSULE | Freq: Every day | ORAL | 0 refills | Status: DC
  Filled 2020-12-07 – 2020-12-14 (×2): qty 90, 30d supply, fill #0

## 2020-12-11 ENCOUNTER — Other Ambulatory Visit: Payer: Self-pay

## 2020-12-13 ENCOUNTER — Other Ambulatory Visit: Payer: Self-pay

## 2020-12-14 ENCOUNTER — Other Ambulatory Visit: Payer: Self-pay

## 2020-12-14 ENCOUNTER — Other Ambulatory Visit: Payer: Self-pay | Admitting: Family Medicine

## 2020-12-14 DIAGNOSIS — G8929 Other chronic pain: Secondary | ICD-10-CM

## 2020-12-14 DIAGNOSIS — M546 Pain in thoracic spine: Secondary | ICD-10-CM

## 2020-12-14 MED ORDER — CYCLOBENZAPRINE HCL 10 MG PO TABS
ORAL_TABLET | Freq: Two times a day (BID) | ORAL | 0 refills | Status: DC | PRN
  Filled 2020-12-14: qty 60, 30d supply, fill #0

## 2020-12-14 NOTE — Telephone Encounter (Signed)
Requested medication (s) are due for refill today: Yes  Requested medication (s) are on the active medication list: Yes  Last refill:  10/10/20  Future visit scheduled: No  Notes to clinic:  See request.    Requested Prescriptions  Pending Prescriptions Disp Refills   cyclobenzaprine (FLEXERIL) 10 MG tablet 60 tablet 0    Sig: Take 1 tablet by mouth 2 (two) times daily as needed for muscle spasms.     Not Delegated - Analgesics:  Muscle Relaxants Failed - 12/14/2020  1:08 AM      Failed - This refill cannot be delegated      Passed - Valid encounter within last 6 months    Recent Outpatient Visits           3 months ago Screening for colon cancer   [facility] [place], [place], MD   1 year ago Type 2 diabetes mellitus with other specified complication, with long-term current use of insulin (HCC)   [facility] [place], [name], MD   1 year ago Gastroenteritis   [facility] [place], [name], MD   1 year ago Type 2 diabetes mellitus with other specified complication, with long-term current use of insulin (HCC)   [facility] [name], MD   2 years ago Other constipation   [facility] [name], MD

## 2020-12-17 ENCOUNTER — Other Ambulatory Visit: Payer: Self-pay

## 2020-12-31 ENCOUNTER — Other Ambulatory Visit: Payer: Self-pay

## 2021-01-03 ENCOUNTER — Other Ambulatory Visit: Payer: Self-pay

## 2021-01-11 ENCOUNTER — Other Ambulatory Visit: Payer: Self-pay

## 2021-01-24 ENCOUNTER — Other Ambulatory Visit: Payer: Self-pay | Admitting: Family Medicine

## 2021-01-25 ENCOUNTER — Other Ambulatory Visit: Payer: Self-pay

## 2021-01-25 MED ORDER — OMEGA-3-ACID ETHYL ESTERS 1 G PO CAPS
3.0000 g | ORAL_CAPSULE | Freq: Every day | ORAL | 0 refills | Status: DC
  Filled 2021-01-25: qty 90, 30d supply, fill #0

## 2021-01-25 NOTE — Telephone Encounter (Signed)
Requested Prescriptions  Pending Prescriptions Disp Refills  . omega-3 acid ethyl esters (LOVAZA) 1 g capsule 90 capsule 0    Sig: Take 3 capsules (3 g total) by mouth daily.     Endocrinology:  Nutritional Agents Passed - 01/24/2021 12:58 PM      Passed - Valid encounter within last 12 months    Recent Outpatient Visits          4 months ago Screening for colon cancer   [facility] [place], [place], MD   1 year ago Type 2 diabetes mellitus with other specified complication, with long-term current use of insulin (HCC)   [facility] [name], MD   1 year ago Gastroenteritis   [facility] [place], [name], MD   1 year ago Type 2 diabetes mellitus with other specified complication, with long-term current use of insulin [facility])   [facility] [name], MD   2 years ago Other constipation    [facility] [name], MD

## 2021-01-29 ENCOUNTER — Other Ambulatory Visit: Payer: Self-pay

## 2021-02-05 ENCOUNTER — Other Ambulatory Visit: Payer: Self-pay

## 2021-02-11 ENCOUNTER — Other Ambulatory Visit: Payer: Self-pay

## 2021-02-12 ENCOUNTER — Other Ambulatory Visit: Payer: Self-pay

## 2021-02-12 ENCOUNTER — Ambulatory Visit: Payer: Self-pay | Attending: Family Medicine

## 2021-02-12 DIAGNOSIS — Z23 Encounter for immunization: Secondary | ICD-10-CM

## 2021-02-14 NOTE — Addendum Note (Signed)
Addended by: Ronette Deter on: 02/14/2021 10:28 AM   Modules accepted: Level of Service

## 2021-03-04 ENCOUNTER — Other Ambulatory Visit: Payer: Self-pay | Admitting: Family Medicine

## 2021-03-04 ENCOUNTER — Other Ambulatory Visit: Payer: Self-pay

## 2021-03-04 DIAGNOSIS — I10 Essential (primary) hypertension: Secondary | ICD-10-CM

## 2021-03-04 DIAGNOSIS — I5022 Chronic systolic (congestive) heart failure: Secondary | ICD-10-CM

## 2021-03-04 DIAGNOSIS — E7800 Pure hypercholesterolemia, unspecified: Secondary | ICD-10-CM

## 2021-03-04 DIAGNOSIS — G8929 Other chronic pain: Secondary | ICD-10-CM

## 2021-03-04 NOTE — Telephone Encounter (Signed)
Requested medication (s) are due for refill today: Only Lovaza  Requested medication (s) are on the active medication list: yes  Last refill:  01/29/21-02/11/21  Future visit scheduled: no  Notes to clinic:  last seen by video 09/04/20, no upcoming appt scheduled. Requesting too soon for all except Lovaza, Flexaril not delegated.     Requested Prescriptions  Pending Prescriptions Disp Refills   spironolactone (ALDACTONE) 25 MG tablet 90 tablet 1    Sig: Take 1 tablet (25 mg total) by mouth daily.     Cardiovascular: Diuretics - Aldosterone Antagonist Failed - 03/04/2021  1:22 PM      Failed - K in normal range and within 360 days    Potassium  Date Value Ref Range Status  09/05/2020 3.4 (L) 3.5 - 5.2 mmol/L Final          Failed - Na in normal range and within 360 days    Sodium  Date Value Ref Range Status  09/05/2020 145 (H) 134 - 144 mmol/L Final          Failed - Valid encounter within last 6 months    Recent Outpatient Visits           6 months ago Screening for colon cancer   [facility] [place], [place], MD   1 year ago Type 2 diabetes mellitus with other specified complication, with long-term current use of insulin (HCC)   [facility] [place], [name], MD   1 year ago Gastroenteritis   [facility] [place], [place], MD   1 year ago Type 2 diabetes mellitus with other specified complication, with long-term current use of insulin (HCC)   [facility] [place], [name], MD   2 years ago Other constipation   [place] [place], [place], MD              Passed - Cr in normal range and within 360 days    Creat  Date Value Ref Range Status  05/27/2016 0.84 0.60 - 1.35 mg/dL Final   Creatinine, Ser  Date Value Ref Range Status  09/05/2020 0.93 0.76 - 1.27 mg/dL Final   Creatinine, Urine  Date Value Ref Range Status   05/27/2016 371 (H) 20 - 370 mg/dL Final    Comment:    Result repeated and verified. Result confirmed by automatic dilution.           Passed - Last BP in normal range    BP Readings from Last 1 Encounters:  10/13/19 123/72           omega-3 acid ethyl esters (LOVAZA) 1 g capsule 90 capsule 0    Sig: Take 3 capsules (3 g total) by mouth daily.     Endocrinology:  Nutritional Agents Passed - 03/04/2021  1:22 PM      Passed - Valid encounter within last 12 months    Recent Outpatient Visits           6 months ago Screening for colon cancer   [facility] [place], [place], MD   1 year ago Type 2 diabetes mellitus with other specified complication, with long-term current use of insulin (HCC)   [facility] [name], MD   1 year ago Gastroenteritis    [facility] [place], [name], MD   1 year ago Type 2 diabetes mellitus  with other specified complication, with long-term current use of insulin (HCC)   [facility] [place], [place], MD   2 years ago Other constipation   [facility] [place], [place], MD               cyclobenzaprine (FLEXERIL) 10 MG tablet 60 tablet 0    Sig: Take 1 tablet by mouth 2 (two) times daily as needed for muscle spasms.     Not Delegated - Analgesics:  Muscle Relaxants Failed - 03/04/2021  1:22 PM      Failed - This refill cannot be delegated      Failed - Valid encounter within last 6 months    Recent Outpatient Visits           6 months ago Screening for colon cancer   [facility] [place], [place], MD   1 year ago Type 2 diabetes mellitus with other specified complication, with long-term current use of insulin (HCC)   [facility] [place], [name], MD   1 year ago Gastroenteritis   [facility] [place],  [place], MD   1 year ago Type 2 diabetes mellitus with other specified complication, with long-term current use of insulin (HCC)   [facility] [place], [name], MD   2 years ago Other constipation   [facility] [place], [place], MD               atorvastatin (LIPITOR) 20 MG tablet 90 tablet 1    Sig: TAKE 1 TABLET (20 MG TOTAL) BY MOUTH DAILY.     Cardiovascular:  Antilipid - Statins Failed - 03/04/2021  1:22 PM      Failed - HDL in normal range and within 360 days    HDL  Date Value Ref Range Status  09/05/2020 26 (L) >39 mg/dL Final          Passed - Total Cholesterol in normal range and within 360 days    Cholesterol, Total  Date Value Ref Range Status  09/05/2020 133 100 - 199 mg/dL Final          Passed - LDL in normal range and within 360 days    LDL Chol Calc (NIH)  Date Value Ref Range Status  09/05/2020 82 0 - 99 mg/dL Final          Passed - Triglycerides in normal range and within 360 days    Triglycerides  Date Value Ref Range Status  09/05/2020 138 0 - 149 mg/dL Final          Passed - Patient is not pregnant      Passed - Valid encounter within last 12 months    Recent Outpatient Visits           6 months ago Screening for colon cancer   [facility] [place], [place], MD   1 year ago Type 2 diabetes mellitus with other specified complication, with long-term current use of insulin (HCC)   [facility] [place], [name], MD   1 year ago Gastroenteritis   [facility] [place], [place], MD   1 year ago Type 2 diabetes mellitus with other specified complication, with long-term current use of insulin (HCC)   [facility] [place], [name], MD   2 years ago Other constipation  [facility] [place], [place], MD               amLODipine (NORVASC) 10 MG  tablet 90 tablet 1    Sig: Take 1 tablet (10 mg total) by mouth daily.     Cardiovascular:  Calcium Channel Blockers Failed - 03/04/2021  1:22 PM      Failed - Valid encounter within last 6 months    Recent Outpatient Visits           6 months ago Screening for colon cancer   [facility] [place], [place], MD   1 year ago Type 2 diabetes mellitus with other specified complication, with long-term current use of insulin (HCC)   [facility] [place], [name], MD   1 year ago Gastroenteritis   [facility] [place], [name], MD   1 year ago Type 2 diabetes mellitus with other specified complication, with long-term current use of insulin (HCC)   [facility] [name], MD   2 years ago Other constipation   [facility] [place], [name], MD              Passed - Last BP in normal range    BP Readings from Last 1 Encounters:  10/13/19 123/72

## 2021-03-05 ENCOUNTER — Other Ambulatory Visit: Payer: Self-pay

## 2021-03-05 MED ORDER — ATORVASTATIN CALCIUM 20 MG PO TABS
ORAL_TABLET | Freq: Every day | ORAL | 0 refills | Status: DC
  Filled 2021-03-05 – 2021-03-15 (×2): qty 30, 30d supply, fill #0

## 2021-03-05 MED ORDER — OMEGA-3-ACID ETHYL ESTERS 1 G PO CAPS
3.0000 g | ORAL_CAPSULE | Freq: Every day | ORAL | 0 refills | Status: DC
  Filled 2021-03-05: qty 90, 30d supply, fill #0

## 2021-03-05 MED ORDER — CYCLOBENZAPRINE HCL 10 MG PO TABS
ORAL_TABLET | Freq: Two times a day (BID) | ORAL | 0 refills | Status: DC | PRN
  Filled 2021-03-05: qty 60, 30d supply, fill #0

## 2021-03-05 MED ORDER — SPIRONOLACTONE 25 MG PO TABS
25.0000 mg | ORAL_TABLET | Freq: Every day | ORAL | 0 refills | Status: DC
  Filled 2021-03-05 – 2021-03-15 (×2): qty 30, 30d supply, fill #0

## 2021-03-05 MED ORDER — AMLODIPINE BESYLATE 10 MG PO TABS
10.0000 mg | ORAL_TABLET | Freq: Every day | ORAL | 0 refills | Status: DC
  Filled 2021-03-05 – 2021-03-15 (×2): qty 30, 30d supply, fill #0

## 2021-03-07 ENCOUNTER — Other Ambulatory Visit: Payer: Self-pay

## 2021-03-15 ENCOUNTER — Other Ambulatory Visit: Payer: Self-pay

## 2021-03-18 ENCOUNTER — Other Ambulatory Visit: Payer: Self-pay

## 2021-04-09 ENCOUNTER — Other Ambulatory Visit: Payer: Self-pay | Admitting: Family Medicine

## 2021-04-09 DIAGNOSIS — G8929 Other chronic pain: Secondary | ICD-10-CM

## 2021-04-09 NOTE — Telephone Encounter (Signed)
Requested medication (s) are due for refill today - yes  Requested medication (s) are on the active medication list -yes  Future visit scheduled -no  Last refill: 03/07/21  Notes to clinic: Attempted to call patient to schedule appointment- left message on VM to call office for appointment. Courtesy RF was given for both with last request- has notes- must have OV for RF- sent for review of request, non delegated Rx  Requested Prescriptions  Pending Prescriptions Disp Refills   cyclobenzaprine (FLEXERIL) 10 MG tablet 60 tablet 0    Sig: Take 1 tablet by mouth 2 (two) times daily as needed for muscle spasms.     Not Delegated - Analgesics:  Muscle Relaxants Failed - 04/09/2021 12:59 PM      Failed - This refill cannot be delegated      Failed - Valid encounter within last 6 months    Recent Outpatient Visits           7 months ago Screening for colon cancer   [facility] [place], [place], MD   1 year ago Type 2 diabetes mellitus with other specified complication, with long-term current use of insulin (HCC)   [facility] [place], [name], MD   1 year ago Gastroenteritis   [facility] [place], [name], MD   2 years ago Type 2 diabetes mellitus with other specified complication, with long-term current use of insulin (HCC)   [facility] [place], [place], MD   2 years ago Other constipation   [facility] [place], [name], MD               omega-3 acid ethyl esters (LOVAZA) 1 g capsule 90 capsule 0    Sig: Take 3 capsules (3 g total) by mouth daily.     Endocrinology:  Nutritional Agents Passed - 04/09/2021 12:59 PM      Passed - Valid encounter within last 12 months    Recent Outpatient Visits           7 months ago Screening for colon cancer   [facility] [place], [place], MD   1 year ago Type 2 diabetes  mellitus with other specified complication, with long-term current use of insulin (HCC)   [facility] [place], [name], MD   1 year ago Gastroenteritis   [facility] [place], [name], MD   2 years ago Type 2 diabetes mellitus with other specified complication, with long-term current use of insulin (HCC)   [place] [facility] [place], [place], MD   2 years ago Other constipation   [facility] [place], [name], MD                 Requested Prescriptions  Pending Prescriptions Disp Refills   cyclobenzaprine (FLEXERIL) 10 MG tablet 60 tablet 0    Sig: Take 1 tablet by mouth 2 (two) times daily as needed for muscle spasms.     Not Delegated - Analgesics:  Muscle Relaxants Failed - 04/09/2021 12:59 PM      Failed - This refill cannot be delegated      Failed - Valid encounter within last 6 months    Recent Outpatient Visits           7 months ago Screening for colon cancer   [facility]  [facility] [name], MD   1 year ago Type 2 diabetes mellitus with other specified complication, with long-term current use of insulin (HCC)   [facility] [place], [place], MD   1 year ago Gastroenteritis   [facility] [place], [name], MD   2 years ago Type 2 diabetes mellitus with other specified complication, with long-term current use of insulin (HCC)   [facility] [place], [place], MD   2 years ago Other constipation   [facility] [place], [name], MD               omega-3 acid ethyl esters (LOVAZA) 1 g capsule 90 capsule 0    Sig: Take 3 capsules (3 g total) by mouth daily.     Endocrinology:  Nutritional Agents Passed - 04/09/2021 12:59 PM      Passed - Valid encounter within last 12 months    Recent Outpatient Visits           7  months ago Screening for colon cancer   [facility] [place], [place], MD   1 year ago Type 2 diabetes mellitus with other specified complication, with long-term current use of insulin (HCC)   [facility] [name], MD   1 year ago Gastroenteritis   [facility] [place], [name], MD   2 years ago Type 2 diabetes mellitus with other specified complication, with long-term current use of insulin (HCC)   [facility] [name], MD   2 years ago Other constipation   [facility] [name], MD

## 2021-04-10 ENCOUNTER — Other Ambulatory Visit: Payer: Self-pay | Admitting: Family Medicine

## 2021-04-10 ENCOUNTER — Other Ambulatory Visit: Payer: Self-pay

## 2021-04-10 DIAGNOSIS — G8929 Other chronic pain: Secondary | ICD-10-CM

## 2021-04-10 NOTE — Telephone Encounter (Signed)
Requested medication (s) are due for refill today - yes  Requested medication (s) are on the active medication list -yes  Future visit scheduled -no  Last refill: 03/07/21  Notes to clinic: Duplicate request- attempted to call patient to schedule-no answer- see message dated 04/09/20. Courtesy RF given with notes- last RF   Requested Prescriptions  Pending Prescriptions Disp Refills   cyclobenzaprine (FLEXERIL) 10 MG tablet 60 tablet 0    Sig: Take 1 tablet by mouth 2 (two) times daily as needed for muscle spasms.     Not Delegated - Analgesics:  Muscle Relaxants Failed - 04/10/2021  8:24 AM      Failed - This refill cannot be delegated      Failed - Valid encounter within last 6 months    Recent Outpatient Visits           7 months ago Screening for colon cancer   [facility] [place], [place], MD   1 year ago Type 2 diabetes mellitus with other specified complication, with long-term current use of insulin (HCC)   [facility] [place], [name], MD   1 year ago Gastroenteritis   [facility] [place], [name], MD   2 years ago Type 2 diabetes mellitus with other specified complication, with long-term current use of insulin (HCC)   [place] [facility] [place], [place], MD   2 years ago Other constipation   [facility] [place], [name], MD               omega-3 acid ethyl esters (LOVAZA) 1 g capsule 90 capsule 0    Sig: Take 3 capsules (3 g total) by mouth daily.     Endocrinology:  Nutritional Agents Passed - 04/10/2021  8:24 AM      Passed - Valid encounter within last 12 months    Recent Outpatient Visits           7 months ago Screening for colon cancer   [facility] [place], Aub[place], MD   1 year ago Type 2 diabetes mellitus with other specified complication, with long-term current use of insulin (HCC)    [place] [facility] [place], [name], MD   1 year ago Gastroenteritis   [facility] [place], [name], MD   2 years ago Type 2 diabetes mellitus with other specified complication, with long-term current use of insulin (HCC)   [place] [place], [place], MD   2 years ago Other constipation   [place] [facility] [place], [name], MD                 Requested Prescriptions  Pending Prescriptions Disp Refills   cyclobenzaprine (FLEXERIL) 10 MG tablet 60 tablet 0    Sig: Take 1 tablet by mouth 2 (two) times daily as needed for muscle spasms.     Not Delegated - Analgesics:  Muscle Relaxants Failed - 04/10/2021  8:24 AM      Failed - This refill cannot be delegated      Failed - Valid encounter within last 6 months    Recent Outpatient Visits           7 months ago Screening for colon cancer   [place] [facility] [place], [name], MD   1 year ago Type 2 diabetes mellitus with other specified  complication, with long-term current use of insulin (HCC)   [place] [facility] [place], [name], MD   1 year ago Gastroenteritis   [facility] [place], [place], MD   2 years ago Type 2 diabetes mellitus with other specified complication, with long-term current use of insulin (HCC)   [facility] [place], [place], MD   2 years ago Other constipation   [facility] [place], [name], MD               omega-3 acid ethyl esters (LOVAZA) 1 g capsule 90 capsule 0    Sig: Take 3 capsules (3 g total) by mouth daily.     Endocrinology:  Nutritional Agents Passed - 04/10/2021  8:24 AM      Passed - Valid encounter within last 12 months    Recent Outpatient Visits           7 months ago Screening for colon cancer   [facility] [place],  [name], MD   1 year ago Type 2 diabetes mellitus with other specified complication, with long-term current use of insulin (HCC)   [place] [facility] [place], [name], MD   1 year ago Gastroenteritis   [facility] [place], [name], MD   2 years ago Type 2 diabetes mellitus with other specified complication, with long-term current use of insulin (HCC)   [facility] [name], MD   2 years ago Other constipation   [place] [facility] [name], MD

## 2021-04-17 ENCOUNTER — Other Ambulatory Visit: Payer: Self-pay

## 2021-04-18 ENCOUNTER — Other Ambulatory Visit: Payer: Self-pay

## 2021-04-18 ENCOUNTER — Other Ambulatory Visit: Payer: Self-pay | Admitting: Family Medicine

## 2021-04-18 DIAGNOSIS — E7800 Pure hypercholesterolemia, unspecified: Secondary | ICD-10-CM

## 2021-04-18 DIAGNOSIS — I5022 Chronic systolic (congestive) heart failure: Secondary | ICD-10-CM

## 2021-04-18 DIAGNOSIS — I10 Essential (primary) hypertension: Secondary | ICD-10-CM

## 2021-04-18 DIAGNOSIS — G8929 Other chronic pain: Secondary | ICD-10-CM

## 2021-04-18 DIAGNOSIS — E1169 Type 2 diabetes mellitus with other specified complication: Secondary | ICD-10-CM

## 2021-04-18 DIAGNOSIS — M546 Pain in thoracic spine: Secondary | ICD-10-CM

## 2021-04-18 NOTE — Telephone Encounter (Signed)
Requested medications are due for refill today.  yes  Requested medications are on the active medications list.  yes  Last refill. varies  Future visit scheduled.   No  Notes to clinic.  Pt was to RTC 02/2021.  Flexeril is not delegated. Pt  Statin could be refilled as it is a 12 month refillable, however Lovaza, also a 12 month refill, was refused last week. Pt is due for OV. Last appointment was 09/04/2020 - virtual.    Requested Prescriptions  Pending Prescriptions Disp Refills   liraglutide (VICTOZA) 18 MG/3ML SOPN 9 mL 6    Sig: INJECT 0.3 MLS (1.8 MG TOTAL) INTO THE SKIN DAILY.     Endocrinology:  Diabetes - GLP-1 Receptor Agonists Failed - 04/18/2021  2:26 AM      Failed - HBA1C is between 0 and 7.9 and within 180 days    HbA1c, POC (controlled diabetic range)  Date Value Ref Range Status  10/13/2019 6.6 0.0 - 7.0 % Final   Hgb A1c MFr Bld  Date Value Ref Range Status  09/05/2020 6.9 (H) 4.8 - 5.6 % Final    Comment:             Prediabetes: 5.7 - 6.4          Diabetes: >6.4          Glycemic control for adults with diabetes: <7.0           Failed - Valid encounter within last 6 months    Recent Outpatient Visits           7 months ago Screening for colon cancer   [facility] [place], [place], MD   1 year ago Type 2 diabetes mellitus with other specified complication, with long-term current use of insulin (HCC)   [place] [facility] [facility] [place], Odette HornsEnobong, MD   1 year ago Gastroenteritis   [place] [facility] [place], [place], MD   2 years ago Type 2 diabetes mellitus with other specified complication, with long-term current use of insulin (HCC)   [place] [facility] [place], [place], MD   2 years ago Other constipation   [place] [facility] [place], Odette HornsEnobong, MD               amLODipine (NORVASC) 10 MG tablet 30 tablet 0    Sig: Take 1 tablet (10 mg  total) by mouth daily.     Cardiovascular:  Calcium Channel Blockers Failed - 04/18/2021  2:26 AM      Failed - Valid encounter within last 6 months    Recent Outpatient Visits           7 months ago Screening for colon cancer   [place] [facility] [place], [place], MD   1 year ago Type 2 diabetes mellitus with other specified complication, with long-term current use of insulin (HCC)   [facility] [place], Odette HornsEnobong, MD   1 year ago Gastroenteritis   [place] [facility] [place], Odette HornsEnobong, MD   2 years ago Type 2 diabetes mellitus with other specified complication, with long-term current use of insulin (HCC)   [place] [facility] [place] Register[place], Enobong, MD   2 years ago Other constipation   [place] Hoy RegisterNewlin, Enobong, MD              Passed - Last BP  in normal range    BP Readings from Last 1 Encounters:  10/13/19 123/72           atorvastatin (LIPITOR) 20 MG tablet 30 tablet 0    Sig: TAKE 1 TABLET (20 MG TOTAL) BY MOUTH DAILY.     Cardiovascular:  Antilipid - Statins Failed - 04/18/2021  2:26 AM      Failed - HDL in normal range and within 360 days    HDL  Date Value Ref Range Status  09/05/2020 26 (L) >39 mg/dL Final          Passed - Total Cholesterol in normal range and within 360 days    Cholesterol, Total  Date Value Ref Range Status  09/05/2020 133 100 - 199 mg/dL Final          Passed - LDL in normal range and within 360 days    LDL Chol Calc (NIH)  Date Value Ref Range Status  09/05/2020 82 0 - 99 mg/dL Final          Passed - Triglycerides in normal range and within 360 days    Triglycerides  Date Value Ref Range Status  09/05/2020 138 0 - 149 mg/dL Final          Passed - Patient is not pregnant      Passed - Valid encounter within last 12 months    Recent Outpatient Visits           7 months ago Screening for colon cancer    [place] [place], [place], MD   1 year ago Type 2 diabetes mellitus with other specified complication, with long-term current use of insulin (HCC)   [facility] [facility] [place], Odette HornsEnobong, MD   1 year ago Gastroenteritis   [place] [facility] [place], [place], MD   2 years ago Type 2 diabetes mellitus with other specified complication, with long-term current use of insulin (HCC)   [place] [place], Odette HornsEnobong, MD   2 years ago Other constipation   [place] [facility] [place], [place] Cross[place], MD               losartan (COZAAR) 100 MG tablet 90 tablet 1    Sig: TAKE 1 TABLET (100 MG TOTAL) BY MOUTH DAILY.     Cardiovascular:  Angiotensin Receptor Blockers Failed - 04/18/2021  2:26 AM      Failed - Cr in normal range and within 180 days    Creat  Date Value Ref Range Status  05/27/2016 0.84 0.60 - 1.35 mg/dL Final   Creatinine, Ser  Date Value Ref Range Status  09/05/2020 0.93 0.76 - 1.27 mg/dL Final   Creatinine, Urine  Date Value Ref Range Status  05/27/2016 371 (H) 20 - 370 mg/dL Final    Comment:    Result repeated and verified. Result confirmed by automatic dilution.           Failed - K in normal range and within 180 days    Potassium  Date Value Ref Range Status  09/05/2020 3.4 (L) 3.5 - 5.2 mmol/L Final          Failed - Valid encounter within last 6 months    Recent Outpatient Visits           7 months ago Screening for colon cancer    [facility] [place] Register[place]lin, Enobong, MD   1 year ago  Type 2 diabetes mellitus with other specified complication, with long-term current use of insulin (HCC)   [facility] [place], [name], MD   1 year ago Gastroenteritis   [facility] [place], [place], MD   2 years ago Type 2 diabetes mellitus with other specified complication, with  long-term current use of insulin [facility])   [place] [facility] [name], MD   2 years ago Other constipation   [place] [facility] [place], [name], MD              Passed - Patient is not pregnant      Passed - Last BP in normal range    BP Readings from Last 1 Encounters:  10/13/19 123/72           spironolactone (ALDACTONE) 25 MG tablet 30 tablet 0    Sig: Take 1 tablet (25 mg total) by mouth daily.     Cardiovascular: Diuretics - Aldosterone Antagonist Failed - 04/18/2021  2:26 AM      Failed - K in normal range and within 360 days    Potassium  Date Value Ref Range Status  09/05/2020 3.4 (L) 3.5 - 5.2 mmol/L Final          Failed - Na in normal range and within 360 days    Sodium  Date Value Ref Range Status  09/05/2020 145 (H) 134 - 144 mmol/L Final          Failed - Valid encounter within last 6 months    Recent Outpatient Visits           7 months ago Screening for colon cancer   [facility] [place], [place], MD   1 year ago Type 2 diabetes mellitus with other specified complication, with long-term current use of insulin (HCC)   [place] [facility] [place], [name], MD   1 year ago Gastroenteritis   [facility] [place], [place], MD   2 years ago Type 2 diabetes mellitus with other specified complication, with long-term current use of insulin (HCC)   [place] [place], [name], MD   2 years ago Other constipation   [facility] [place], [place], MD              Passed - Cr in normal range and within 360 days    Creat  Date Value Ref Range Status  05/27/2016 0.84 0.60 - 1.35 mg/dL Final   Creatinine, Ser  Date Value Ref Range Status  09/05/2020 0.93 0.76 - 1.27 mg/dL Final   Creatinine, Urine  Date Value Ref Range Status  05/27/2016 371 (H) 20 - 370 mg/dL Final     Comment:    Result repeated and verified. Result confirmed by automatic dilution.           Passed - Last BP in normal range    BP Readings from Last 1 Encounters:  10/13/19 123/72           omega-3 acid ethyl esters (LOVAZA) 1 g capsule 90 capsule 0    Sig: Take 3 capsules (3 g total) by mouth daily.     Endocrinology:  Nutritional Agents Passed - 04/18/2021  2:26 AM      Passed - Valid encounter within last 12 months    Recent Outpatient Visits  7 months ago Screening for colon cancer   [place] [facility] [place], [place], MD   1 year ago Type 2 diabetes mellitus with other specified complication, with long-term current use of insulin (HCC)   [facility] [place], [name], MD   1 year ago Gastroenteritis   [place] [place], [name], MD   2 years ago Type 2 diabetes mellitus with other specified complication, with long-term current use of insulin (HCC)   [facility] [place], [place], MD   2 years ago Other constipation   [place] [facility] [place], [name], MD               cyclobenzaprine (FLEXERIL) 10 MG tablet 60 tablet 0    Sig: Take 1 tablet by mouth 2 (two) times daily as needed for muscle spasms.     Not Delegated - Analgesics:  Muscle Relaxants Failed - 04/18/2021  2:26 AM      Failed - This refill cannot be delegated      Failed - Valid encounter within last 6 months    Recent Outpatient Visits           7 months ago Screening for colon cancer   [place] [facility] [place], [place], MD   1 year ago Type 2 diabetes mellitus with other specified complication, with long-term current use of insulin (HCC)   [place] [facility] [place], [name], MD   1 year ago Gastroenteritis   [facility] [facility] [place], [name], MD   2 years ago Type 2 diabetes  mellitus with other specified complication, with long-term current use of insulin (HCC)   [place] [facility] [name], MD   2 years ago Other constipation   [place] [name], MD

## 2021-04-19 ENCOUNTER — Other Ambulatory Visit: Payer: Self-pay

## 2021-04-19 ENCOUNTER — Other Ambulatory Visit: Payer: Self-pay | Admitting: Family Medicine

## 2021-04-19 DIAGNOSIS — E7800 Pure hypercholesterolemia, unspecified: Secondary | ICD-10-CM

## 2021-04-19 DIAGNOSIS — E1169 Type 2 diabetes mellitus with other specified complication: Secondary | ICD-10-CM

## 2021-04-19 DIAGNOSIS — G8929 Other chronic pain: Secondary | ICD-10-CM

## 2021-04-19 DIAGNOSIS — I10 Essential (primary) hypertension: Secondary | ICD-10-CM

## 2021-04-19 DIAGNOSIS — I5022 Chronic systolic (congestive) heart failure: Secondary | ICD-10-CM

## 2021-04-19 DIAGNOSIS — Z794 Long term (current) use of insulin: Secondary | ICD-10-CM

## 2021-04-19 MED ORDER — OMEGA-3-ACID ETHYL ESTERS 1 G PO CAPS
3.0000 g | ORAL_CAPSULE | Freq: Every day | ORAL | 0 refills | Status: DC
  Filled 2021-04-19 (×2): qty 270, 90d supply, fill #0

## 2021-04-19 MED ORDER — CYCLOBENZAPRINE HCL 10 MG PO TABS
ORAL_TABLET | Freq: Two times a day (BID) | ORAL | 0 refills | Status: DC | PRN
  Filled 2021-04-19: qty 60, 30d supply, fill #0

## 2021-04-19 MED ORDER — AMLODIPINE BESYLATE 10 MG PO TABS
10.0000 mg | ORAL_TABLET | Freq: Every day | ORAL | 0 refills | Status: DC
  Filled 2021-04-19: qty 90, 90d supply, fill #0

## 2021-04-19 MED ORDER — LOSARTAN POTASSIUM 100 MG PO TABS
ORAL_TABLET | Freq: Every day | ORAL | 0 refills | Status: DC
  Filled 2021-04-19: qty 30, 30d supply, fill #0

## 2021-04-19 MED ORDER — ATORVASTATIN CALCIUM 20 MG PO TABS
ORAL_TABLET | Freq: Every day | ORAL | 0 refills | Status: DC
  Filled 2021-04-19: qty 90, 90d supply, fill #0

## 2021-04-19 MED ORDER — VICTOZA 18 MG/3ML ~~LOC~~ SOPN
PEN_INJECTOR | Freq: Every day | SUBCUTANEOUS | 0 refills | Status: DC
  Filled 2021-04-19: qty 9, 30d supply, fill #0

## 2021-04-19 MED ORDER — SPIRONOLACTONE 25 MG PO TABS
25.0000 mg | ORAL_TABLET | Freq: Every day | ORAL | 0 refills | Status: DC
  Filled 2021-04-19: qty 30, 30d supply, fill #0

## 2021-04-19 NOTE — Telephone Encounter (Signed)
Requested medication (s) are due for refill today: yes  Requested medication (s) are on the active medication list: yes  Last refill:  Victoza and losartan 09/04/20, spironolactone and flexeril 03/05/21  Future visit scheduled: yes  Notes to clinic:  Unable to refill per protocol due to failed labs, no updated results. Flexeril unable to delegate     Requested Prescriptions  Pending Prescriptions Disp Refills   liraglutide (VICTOZA) 18 MG/3ML SOPN 9 mL 6    Sig: INJECT 0.3 MLS (1.8 MG TOTAL) INTO THE SKIN DAILY.     Endocrinology:  Diabetes - GLP-1 Receptor Agonists Failed - 04/19/2021 11:38 AM      Failed - HBA1C is between 0 and 7.9 and within 180 days    HbA1c, POC (controlled diabetic range)  Date Value Ref Range Status  10/13/2019 6.6 0.0 - 7.0 % Final   Hgb A1c MFr Bld  Date Value Ref Range Status  09/05/2020 6.9 (H) 4.8 - 5.6 % Final    Comment:             Prediabetes: 5.7 - 6.4          Diabetes: >6.4          Glycemic control for adults with diabetes: <7.0           Failed - Valid encounter within last 6 months    Recent Outpatient Visits           7 months ago Screening for colon cancer   [place], [place], MD   1 year ago Type 2 diabetes mellitus with other specified complication, with long-term current use of insulin ([place])   [place], Charlane Ferretti, MD   1 year ago [place], [place], MD   2 years ago Type 2 diabetes mellitus with other specified complication, with long-term current use of insulin ([place])   [place], Enobong, MD   2 years ago Other constipation   [place], Charlane Ferretti, MD       Future Appointments             In 3 weeks Charlott Rakes, MD [place]             losartan (COZAAR) 100 MG tablet 90  tablet 1    Sig: TAKE 1 TABLET (100 MG TOTAL) BY MOUTH DAILY.     Cardiovascular:  Angiotensin Receptor Blockers Failed - 04/19/2021 11:38 AM      Failed - Cr in normal range and within 180 days    Creat  Date Value Ref Range Status  05/27/2016 0.84 0.60 - 1.35 mg/dL Final   Creatinine, Ser  Date Value Ref Range Status  09/05/2020 0.93 0.76 - 1.27 mg/dL Final   Creatinine, Urine  Date Value Ref Range Status  05/27/2016 371 (H) 20 - 370 mg/dL Final    Comment:    Result repeated and verified. Result confirmed by automatic dilution.           Failed - K in normal range and within 180 days    Potassium  Date Value Ref Range Status  09/05/2020 3.4 (L) 3.5 - 5.2 mmol/L Final          Failed - Valid encounter within last 6 months    Recent Outpatient Visits  7 months ago Screening for colon cancer   [place], [place], MD   1 year ago Type 2 diabetes mellitus with other specified complication, with long-term current use of insulin ([place])   [place], Charlane Ferretti, MD   1 year ago [place], Charlane Ferretti, MD   2 years ago Type 2 diabetes mellitus with other specified complication, with long-term current use of insulin ([place])   [facility] [facility] Charlott Rakes, MD   2 years ago Other constipation   [place], Enobong, MD       Future Appointments             In 3 weeks Charlott Rakes, MD [place] - Patient is not pregnant      Passed - Last BP in normal range    BP Readings from Last 1 Encounters:  10/13/19 123/72           spironolactone (ALDACTONE) 25 MG tablet 30 tablet 0    Sig: Take 1 tablet (25 mg total) by mouth daily.     Cardiovascular: Diuretics - Aldosterone Antagonist Failed - 04/19/2021 11:38 AM       Failed - K in normal range and within 360 days    Potassium  Date Value Ref Range Status  09/05/2020 3.4 (L) 3.5 - 5.2 mmol/L Final          Failed - Na in normal range and within 360 days    Sodium  Date Value Ref Range Status  09/05/2020 145 (H) 134 - 144 mmol/L Final          Failed - Valid encounter within last 6 months    Recent Outpatient Visits           7 months ago Screening for colon cancer   [place], [place], MD   1 year ago Type 2 diabetes mellitus with other specified complication, with long-term current use of insulin ([place])   [place], Charlane Ferretti, MD   1 year ago [place], [place], MD   2 years ago Type 2 diabetes mellitus with other specified complication, with long-term current use of insulin ([place])   [place] [facility] [place], Charlane Ferretti, MD   2 years ago Other constipation   [place], Charlane Ferretti, MD       Future Appointments             In 3 weeks Charlott Rakes, MD [place] in normal range and within 360 days    Creat  Date Value Ref Range Status  05/27/2016 0.84 0.60 - 1.35 mg/dL Final   Creatinine, Ser  Date Value Ref Range Status  09/05/2020 0.93 0.76 - 1.27 mg/dL Final   Creatinine, Urine  Date Value Ref Range Status  05/27/2016 371 (H) 20 - 370 mg/dL Final    Comment:    Result repeated and verified. Result confirmed by automatic dilution.           Passed - Last BP in normal range    BP Readings  from Last 1 Encounters:  10/13/19 123/72           cyclobenzaprine (FLEXERIL) 10 MG tablet 60 tablet 0    Sig: Take 1 tablet by mouth 2 (two) times daily as needed for muscle spasms.     Not Delegated - Analgesics:  Muscle Relaxants Failed - 04/19/2021 11:38 AM      Failed - This refill  cannot be delegated      Failed - Valid encounter within last 6 months    Recent Outpatient Visits           7 months ago Screening for colon cancer   [place], [place], MD   1 year ago Type 2 diabetes mellitus with other specified complication, with long-term current use of insulin ([place])   [place], Charlane Ferretti, MD   1 year ago [place], Charlane Ferretti, MD   2 years ago Type 2 diabetes mellitus with other specified complication, with long-term current use of insulin ([place])   [place], Enobong, MD   2 years ago Other constipation   [place], Charlane Ferretti, MD       Future Appointments             In 3 weeks Charlott Rakes, MD [place]            Signed Prescriptions Disp Refills   omega-3 acid ethyl esters (LOVAZA) 1 g capsule 270 capsule 0    Sig: Take 3 capsules (3 g total) by mouth daily.     Endocrinology:  Nutritional Agents Passed - 04/19/2021 11:38 AM      Passed - Valid encounter within last 12 months    Recent Outpatient Visits           7 months ago Screening for colon cancer   [place], Perry, MD   1 year ago Type 2 diabetes mellitus with other specified complication, with long-term current use of insulin ([place])   [place] [facility] [place], Charlane Ferretti, MD   1 year ago [place], Charlane Ferretti, MD   2 years ago Type 2 diabetes mellitus with other specified complication, with long-term current use of insulin ([place])   [place], Enobong, MD   2 years ago Other constipation   [place], Enobong, MD       Future Appointments             In 3  weeks Charlott Rakes, MD [place]             atorvastatin (LIPITOR) 20 MG tablet 90 tablet 0    Sig: TAKE 1 TABLET (20 MG TOTAL) BY MOUTH DAILY.     Cardiovascular:  Antilipid - Statins Failed - 04/19/2021 11:38 AM      Failed - HDL in normal range and within 360 days    HDL  Date Value Ref Range Status  09/05/2020 26 (L) >39 mg/dL Final          Passed - Total Cholesterol in normal range and within 360 days    Cholesterol, Total  Date Value Ref Range Status  09/05/2020 133 100 - 199 mg/dL Final  Passed - LDL in normal range and within 360 days    LDL Chol Calc (NIH)  Date Value Ref Range Status  09/05/2020 82 0 - 99 mg/dL Final          Passed - Triglycerides in normal range and within 360 days    Triglycerides  Date Value Ref Range Status  09/05/2020 138 0 - 149 mg/dL Final          Passed - Patient is not pregnant      Passed - Valid encounter within last 12 months    Recent Outpatient Visits           7 months ago Screening for colon cancer   [place], [place], MD   1 year ago Type 2 diabetes mellitus with other specified complication, with long-term current use of insulin ([place])   [place], Charlane Ferretti, MD   1 year ago [place], [place], MD   2 years ago Type 2 diabetes mellitus with other specified complication, with long-term current use of insulin ([place])   [place], Enobong, MD   2 years ago Other constipation   [place], Charlane Ferretti, MD       Future Appointments             In 3 weeks Charlott Rakes, MD [place]             amLODipine (NORVASC) 10 MG tablet 90 tablet 0    Sig: Take 1 tablet (10 mg total) by mouth daily.     Cardiovascular:  Calcium Channel  Blockers Failed - 04/19/2021 11:38 AM      Failed - Valid encounter within last 6 months    Recent Outpatient Visits           7 months ago Screening for colon cancer   [place], [place], MD   1 year ago Type 2 diabetes mellitus with other specified complication, with long-term current use of insulin ([place])   [place] [facility] [place], Charlane Ferretti, MD   1 year ago [place], Charlane Ferretti, MD   2 years ago Type 2 diabetes mellitus with other specified complication, with long-term current use of insulin ([place])   [place] [facility] Charlott Rakes, MD   2 years ago Other constipation   [place], Enobong, MD       Future Appointments             In 3 weeks Charlott Rakes, MD [place] - Last BP in normal range    BP Readings from Last 1 Encounters:  10/13/19 123/72

## 2021-04-19 NOTE — Telephone Encounter (Signed)
Requested Prescriptions  Pending Prescriptions Disp Refills   liraglutide (VICTOZA) 18 MG/3ML SOPN 9 mL 6    Sig: INJECT 0.3 MLS (1.8 MG TOTAL) INTO THE SKIN DAILY.     Endocrinology:  Diabetes - GLP-1 Receptor Agonists Failed - 04/19/2021 11:38 AM      Failed - HBA1C is between 0 and 7.9 and within 180 days    HbA1c, POC (controlled diabetic range)  Date Value Ref Range Status  10/13/2019 6.6 0.0 - 7.0 % Final   Hgb A1c MFr Bld  Date Value Ref Range Status  09/05/2020 6.9 (H) 4.8 - 5.6 % Final    Comment:             Prediabetes: 5.7 - 6.4          Diabetes: >6.4          Glycemic control for adults with diabetes: <7.0          Failed - Valid encounter within last 6 months    Recent Outpatient Visits          7 months ago Screening for colon cancer   [facility] [place]   1 year ago Type 2 diabetes mellitus with other specified complication, with long-term current use of insulin (HCC)   [facility] [place], [place], MD   1 year ago Gastroenteritis   [facility] [place], [place], [place]   2 years ago Type 2 diabetes mellitus with other specified complication, with long-term current use of insulin (HCC)   [facility] [place], [place], MD   2 years ago Other constipation   [facility] [place], Odette [place], MD      Future Appointments            In 3 weeks H[place] Register[place], [place], MD [facility]            losartan (COZAAR) 100 MG tablet 90 tablet 1    Sig: TAKE 1 TABLET (100 MG TOTAL) BY MOUTH DAILY.     Cardiovascular:  Angiotensin Receptor Blockers Failed - 04/19/2021 11:38 AM      Failed - Cr in normal range and within 180 days    Creat  Date Value Ref Range Status  05/27/2016 0.84 0.60 - 1.35 mg/dL Final   Creatinine, Ser  Date Value Ref Range Status  09/05/2020 0.93 0.76 - 1.27 mg/dL  Final   Creatinine, Urine  Date Value Ref Range Status  05/27/2016 371 (H) 20 - 370 mg/dL Final    Comment:    Result repeated and verified. Result confirmed by automatic dilution.          Failed - K in normal range and within 180 days    Potassium  Date Value Ref Range Status  09/05/2020 3.4 (L) 3.5 - 5.2 mmol/L Final         Failed - Valid encounter within last 6 months    Recent Outpatient Visits          7 months ago Screening for colon cancer   [facility] [place], [place], [place]   1 year ago Type 2 diabetes mellitus with other specified complication, with long-term current use of insulin (HCC)   [facility] H[place] RegisterNewlin, [place]ong, MD   1 year ago Gastroenteritis   [facility] [place] RegisterNewlin, En[place], MD  2 years ago Type 2 diabetes mellitus with other specified complication, with long-term current use of insulin (HCC)   [facility] [name], MD   2 years ago Other constipation   [facility] [name], MD      Future Appointments            In 3 weeks [name], MD [facility]           Passed - Patient is not pregnant      Passed - Last BP in normal range    BP Readings from Last 1 Encounters:  10/13/19 123/72          spironolactone (ALDACTONE) 25 MG tablet 30 tablet 0    Sig: Take 1 tablet (25 mg total) by mouth daily.     Cardiovascular: Diuretics - Aldosterone Antagonist Failed - 04/19/2021 11:38 AM      Failed - K in normal range and within 360 days    Potassium  Date Value Ref Range Status  09/05/2020 3.4 (L) 3.5 - 5.2 mmol/L Final         Failed - Na in normal range and within 360 days    Sodium  Date Value Ref Range Status  09/05/2020 145 (H) 134 - 144 mmol/L Final         Failed - Valid encounter within last 6 months    Recent Outpatient Visits          7  months ago Screening for colon cancer   [facility] [place], [place], MD   1 year ago Type 2 diabetes mellitus with other specified complication, with long-term current use of insulin (HCC)   [facility] [place], [name], MD   1 year ago Gastroenteritis   [facility] [place], [place], MD   2 years ago Type 2 diabetes mellitus with other specified complication, with long-term current use of insulin (HCC)   [facility] [place], [name], MD   2 years ago Other constipation   [facility] [place], [name], MD      Future Appointments            In 3 weeks [name], MD [facility]           Passed - Cr in normal range and within 360 days    Creat  Date Value Ref Range Status  05/27/2016 0.84 0.60 - 1.35 mg/dL Final   Creatinine, Ser  Date Value Ref Range Status  09/05/2020 0.93 0.76 - 1.27 mg/dL Final   Creatinine, Urine  Date Value Ref Range Status  05/27/2016 371 (H) 20 - 370 mg/dL Final    Comment:    Result repeated and verified. Result confirmed by automatic dilution.          Passed - Last BP in normal range    BP Readings from Last 1 Encounters:  10/13/19 123/72          omega-3 acid ethyl esters (LOVAZA) 1 g capsule 270 capsule 0    Sig: Take 3 capsules (3 g total) by mouth daily.     Endocrinology:  Nutritional Agents Passed - 04/19/2021 11:38 AM      Passed - Valid encounter within last 12 months    Recent Outpatient Visits  7 months ago Screening for colon cancer   [facility] [place], [place], MD   1 year ago Type 2 diabetes mellitus with other specified complication, with long-term current use of insulin (HCC)   [facility] [place], [name], MD   1 year ago Gastroenteritis   [facility] [place], [name], MD   2 years ago Type 2 diabetes mellitus with other specified complication, with long-term current use of insulin (HCC)   [facility] [name], MD   2 years ago Other constipation   [facility] [place], [name], MD      Future Appointments            In 3 weeks [name], MD [facility]            cyclobenzaprine (FLEXERIL) 10 MG tablet 60 tablet 0    Sig: Take 1 tablet by mouth 2 (two) times daily as needed for muscle spasms.     Not Delegated - Analgesics:  Muscle Relaxants Failed - 04/19/2021 11:38 AM      Failed - This refill cannot be delegated      Failed - Valid encounter within last 6 months    Recent Outpatient Visits          7 months ago Screening for colon cancer   [facility] [place], [place], MD   1 year ago Type 2 diabetes mellitus with other specified complication, with long-term current use of insulin (HCC)   [facility] [place], [name], MD   1 year ago Gastroenteritis   [facility] [place], [name], MD   2 years ago Type 2 diabetes mellitus with other specified complication, with long-term current use of insulin (HCC)   [facility] [name], MD   2 years ago Other constipation   [facility] [place], [name], MD      Future Appointments            In 3 weeks [name], MD [facility]            atorvastatin (LIPITOR) 20 MG tablet 90 tablet 0    Sig: TAKE 1 TABLET (20 MG TOTAL) BY MOUTH DAILY.     Cardiovascular:  Antilipid - Statins Failed - 04/19/2021 11:38 AM      Failed - HDL in normal range and within 360 days    HDL  Date Value Ref Range Status  09/05/2020 26 (L) >39 mg/dL Final         Passed - Total Cholesterol in normal  range and within 360 days    Cholesterol, Total  Date Value Ref Range Status  09/05/2020 133 100 - 199 mg/dL Final         Passed - LDL in normal range and within 360 days    LDL Chol Calc (NIH)  Date Value Ref Range Status  09/05/2020 82 0 - 99 mg/dL Final         Passed - Triglycerides in normal range and within 360 days    Triglycerides  Date Value Ref Range Status  09/05/2020 138 0 - 149 mg/dL Final         Passed - Patient is not pregnant      Passed - Valid encounter within  last 12 months    Recent Outpatient Visits          7 months ago Screening for colon cancer   [facility] [place], [place], [place]   1 year ago Type 2 diabetes mellitus with other specified complication, with long-term current use of insulin (HCC)   [facility] [place], Od[place], MD   1 year ago Gastroenteritis   [facility] [place], [place], MD   2 years ago Type 2 diabetes mellitus with other specified complication, with long-term current use of insulin (HCC)   [facility] H[place] RegisterNewlin, [place], MD   2 years ago Other constipation   [facility] [place], MD      Future Appointments            In 3 weeks Hoy RegisterNewlin, [place], MD [facility]            amLODipine (NORVASC) 10 MG tablet 90 tablet 0    Sig: Take 1 tablet (10 mg total) by mouth daily.     Cardiovascular:  Calcium Channel Blockers Failed - 04/19/2021 11:38 AM      Failed - Valid encounter within last 6 months    Recent Outpatient Visits          7 months ago Screening for colon cancer   [facility] [place], [place]   1 year ago Type 2 diabetes mellitus with other specified complication, with long-term current use of insulin (HCC)   [facility] [place], Odette [place], MD   1 year ago Gastroenteritis    [facility] [place], Od[place], MD   2 years ago Type 2 diabetes mellitus with other specified complication, with long-term current use of insulin (HCC)   [facility] H[place] RegisterNewlin, En[place], MD   2 years ago Other constipation   [facility] H[place] RegisterNewlin, En[place], MD      Future Appointments            In 3 weeks Hoy RegisterNewlin, [place], [facility]           Passed - Last BP in normal range    BP Readings from Last 1 Encounters:  10/13/19 123/72

## 2021-04-22 ENCOUNTER — Other Ambulatory Visit: Payer: Self-pay

## 2021-04-23 ENCOUNTER — Other Ambulatory Visit: Payer: Self-pay

## 2021-05-14 ENCOUNTER — Encounter: Payer: Self-pay | Admitting: Family Medicine

## 2021-05-14 ENCOUNTER — Ambulatory Visit: Payer: Self-pay | Attending: Family Medicine | Admitting: Family Medicine

## 2021-05-14 ENCOUNTER — Other Ambulatory Visit: Payer: Self-pay

## 2021-05-14 VITALS — BP 123/87 | HR 87 | Ht 73.0 in | Wt 284.6 lb

## 2021-05-14 DIAGNOSIS — Z1211 Encounter for screening for malignant neoplasm of colon: Secondary | ICD-10-CM

## 2021-05-14 DIAGNOSIS — I5022 Chronic systolic (congestive) heart failure: Secondary | ICD-10-CM

## 2021-05-14 DIAGNOSIS — Z794 Long term (current) use of insulin: Secondary | ICD-10-CM

## 2021-05-14 DIAGNOSIS — J387 Other diseases of larynx: Secondary | ICD-10-CM

## 2021-05-14 DIAGNOSIS — E1169 Type 2 diabetes mellitus with other specified complication: Secondary | ICD-10-CM

## 2021-05-14 DIAGNOSIS — K429 Umbilical hernia without obstruction or gangrene: Secondary | ICD-10-CM

## 2021-05-14 DIAGNOSIS — E7800 Pure hypercholesterolemia, unspecified: Secondary | ICD-10-CM

## 2021-05-14 DIAGNOSIS — I10 Essential (primary) hypertension: Secondary | ICD-10-CM

## 2021-05-14 LAB — POCT GLYCOSYLATED HEMOGLOBIN (HGB A1C): HbA1c, POC (controlled diabetic range): 7.5 % — AB (ref 0.0–7.0)

## 2021-05-14 LAB — GLUCOSE, POCT (MANUAL RESULT ENTRY): POC Glucose: 142 mg/dl — AB (ref 70–99)

## 2021-05-14 MED ORDER — ATORVASTATIN CALCIUM 20 MG PO TABS
ORAL_TABLET | Freq: Every day | ORAL | 0 refills | Status: DC
  Filled 2021-05-14: qty 90, fill #0
  Filled 2021-07-16: qty 90, 90d supply, fill #0

## 2021-05-14 MED ORDER — LOSARTAN POTASSIUM 100 MG PO TABS
ORAL_TABLET | Freq: Every day | ORAL | 1 refills | Status: DC
  Filled 2021-05-14: qty 90, 90d supply, fill #0
  Filled 2021-05-22: qty 30, 30d supply, fill #0
  Filled 2021-06-14: qty 30, 30d supply, fill #1
  Filled 2021-07-16: qty 30, 30d supply, fill #2
  Filled 2021-08-27: qty 90, 90d supply, fill #3

## 2021-05-14 MED ORDER — GLIPIZIDE 10 MG PO TABS
ORAL_TABLET | ORAL | 1 refills | Status: DC
  Filled 2021-05-14: qty 180, 90d supply, fill #0
  Filled 2021-05-22: qty 60, 30d supply, fill #0
  Filled 2021-07-24: qty 180, 90d supply, fill #1

## 2021-05-14 MED ORDER — OMEGA-3-ACID ETHYL ESTERS 1 G PO CAPS
3.0000 g | ORAL_CAPSULE | Freq: Every day | ORAL | 1 refills | Status: DC
  Filled 2021-05-14 – 2021-07-24 (×2): qty 270, 90d supply, fill #0
  Filled 2021-10-12: qty 270, 90d supply, fill #1

## 2021-05-14 MED ORDER — HUMULIN 70/30 KWIKPEN (70-30) 100 UNIT/ML ~~LOC~~ SUPN
50.0000 [IU] | PEN_INJECTOR | Freq: Two times a day (BID) | SUBCUTANEOUS | 6 refills | Status: DC
  Filled 2021-05-14 – 2021-08-29 (×2): qty 30, 30d supply, fill #0
  Filled 2021-10-11: qty 120, 120d supply, fill #0

## 2021-05-14 MED ORDER — SPIRONOLACTONE 25 MG PO TABS
25.0000 mg | ORAL_TABLET | Freq: Every day | ORAL | 1 refills | Status: DC
  Filled 2021-05-14 – 2021-05-21 (×2): qty 90, 90d supply, fill #0
  Filled 2021-08-27: qty 90, 90d supply, fill #1

## 2021-05-14 MED ORDER — AMLODIPINE BESYLATE 10 MG PO TABS
10.0000 mg | ORAL_TABLET | Freq: Every day | ORAL | 1 refills | Status: DC
  Filled 2021-05-14 – 2021-07-24 (×3): qty 90, 90d supply, fill #0
  Filled 2021-08-27 – 2021-10-12 (×2): qty 90, 90d supply, fill #1

## 2021-05-14 MED ORDER — TRULICITY 1.5 MG/0.5ML ~~LOC~~ SOAJ
1.5000 mg | SUBCUTANEOUS | 6 refills | Status: DC
  Filled 2021-05-14 – 2021-05-22 (×3): qty 2, 28d supply, fill #0
  Filled 2021-06-14: qty 2, 28d supply, fill #1
  Filled 2021-07-16: qty 2, 28d supply, fill #2
  Filled 2021-08-27: qty 2, 28d supply, fill #3
  Filled 2021-09-23: qty 2, 28d supply, fill #4
  Filled 2021-10-31 – 2021-11-13 (×2): qty 2, 28d supply, fill #5
  Filled 2021-12-03: qty 2, 28d supply, fill #6

## 2021-05-14 MED ORDER — CETIRIZINE HCL 10 MG PO TABS
10.0000 mg | ORAL_TABLET | Freq: Every day | ORAL | 1 refills | Status: DC
  Filled 2021-05-14 – 2021-05-22 (×4): qty 30, 30d supply, fill #0
  Filled 2021-06-14: qty 30, 30d supply, fill #1

## 2021-05-14 MED ORDER — CARVEDILOL 25 MG PO TABS
ORAL_TABLET | Freq: Two times a day (BID) | ORAL | 1 refills | Status: DC
  Filled 2021-05-14 – 2021-06-14 (×2): qty 180, 90d supply, fill #0
  Filled 2021-10-12: qty 180, 90d supply, fill #1

## 2021-05-14 NOTE — Patient Instructions (Signed)
Exercising to Stay Healthy °To become healthy and stay healthy, it is recommended that you do moderate-intensity and vigorous-intensity exercise. You can tell that you are exercising at a moderate intensity if your heart starts beating faster and you start breathing faster but can still hold a conversation. You can tell that you are exercising at a vigorous intensity if you are breathing much harder and faster and cannot hold a conversation while exercising. °How can exercise benefit me? °Exercising regularly is important. It has many health benefits, such as: °Improving overall fitness, flexibility, and endurance. °Increasing bone density. °Helping with weight control. °Decreasing body fat. °Increasing muscle strength and endurance. °Reducing stress and tension, anxiety, depression, or anger. °Improving overall health. °What guidelines should I follow while exercising? °Before you start a new exercise program, talk with your health care provider. °Do not exercise so much that you hurt yourself, feel dizzy, or get very short of breath. °Wear comfortable clothes and wear shoes with good support. °Drink plenty of water while you exercise to prevent dehydration or heat stroke. °Work out until your breathing and your heartbeat get faster (moderate intensity). °How often should I exercise? °Choose an activity that you enjoy, and set realistic goals. Your health care provider can help you make an activity plan that is individually designed and works best for you. °Exercise regularly as told by your health care provider. This may include: °Doing strength training two times a week, such as: °Lifting weights. °Using resistance bands. °Push-ups. °Sit-ups. °Yoga. °Doing a certain intensity of exercise for a given amount of time. Choose from these options: °A total of 150 minutes of moderate-intensity exercise every week. °A total of 75 minutes of vigorous-intensity exercise every week. °A mix of moderate-intensity and  vigorous-intensity exercise every week. °Children, pregnant women, people who have not exercised regularly, people who are overweight, and older adults may need to talk with a health care provider about what activities are safe to perform. If you have a medical condition, be sure to talk with your health care provider before you start a new exercise program. °What are some exercise ideas? °Moderate-intensity exercise ideas include: °Walking 1 mile (1.6 km) in about 15 minutes. °Biking. °Hiking. °Golfing. °Dancing. °Water aerobics. °Vigorous-intensity exercise ideas include: °Walking 4.5 miles (7.2 km) or more in about 1 hour. °Jogging or running 5 miles (8 km) in about 1 hour. °Biking 10 miles (16.1 km) or more in about 1 hour. °Lap swimming. °Roller-skating or in-line skating. °Cross-country skiing. °Vigorous competitive sports, such as football, basketball, and soccer. °Jumping rope. °Aerobic dancing. °What are some everyday activities that can help me get exercise? °Yard work, such as: °Pushing a lawn mower. °Raking and bagging leaves. °Washing your car. °Pushing a stroller. °Shoveling snow. °Gardening. °Washing windows or floors. °How can I be more active in my day-to-day activities? °Use stairs instead of an elevator. °Take a walk during your lunch break. °If you drive, park your car farther away from your work or school. °If you take public transportation, get off one stop early and walk the rest of the way. °Stand up or walk around during all of your indoor phone calls. °Get up, stretch, and walk around every 30 minutes throughout the day. °Enjoy exercise with a friend. Support to continue exercising will help you keep a regular routine of activity. °Where to find more information °You can find more information about exercising to stay healthy from: °[place] Department of Health and Human Services: www.hhs.gov °Centers for Disease Control and Prevention (  CDC): www.cdc.gov °Summary °Exercising regularly is  important. It will improve your overall fitness, flexibility, and endurance. °Regular exercise will also improve your overall health. It can help you control your weight, reduce stress, and improve your bone density. °Do not exercise so much that you hurt yourself, feel dizzy, or get very short of breath. °Before you start a new exercise program, talk with your health care provider. °This information is not intended to replace advice given to you by your health care provider. Make sure you discuss any questions you have with your health care provider. °Document Revised: 07/13/2020 Document Reviewed: 07/13/2020 °Elsevier Patient Education © 2022 [employer] ° °

## 2021-05-14 NOTE — Progress Notes (Signed)
Throat is dry in the morning.

## 2021-05-14 NOTE — Progress Notes (Addendum)
Subjective:  Patient ID: Dave Arnold, male    DOB: [birth year]  Age: 51 y.o. MRN: [id number]  CC: Diabetes   HPI Dave Arnold is a 51 y.o. year old male with a history of type 2 diabetes mellitus (A1c 6.6), hypertension, hyperlipidemia, CHF (EF 50-55% from 06/2013), obesity who is seen for a follow-up visit.  Interval History: He complains of dryness in his throat in the mornings for the last 3-4 months and he has to cough up but this does not occur in the summer. When he is brushing his teeth he feels like he has mucus stuck in his throat.  His A1c is 7.4 up from 6.6 and he endorses splurging over the last couple of weeks but promises to get back on track.  Denies presence of neuropathy, visual concerns, hypoglycemia.  He is not up-to-date on his eye exam. Compliant with his antihypertensive at this time. From a cardiac standpoint he is doing well with no dyspnea, pedal edema. Past Medical History:  Diagnosis Date   Diabetes mellitus without complication ([place])    Heart failure of unknown type ([place]) 06/2011   Hypertension    Obesity    Thrombocytopenia ([place])     No past surgical history on file.  Family History  Problem Relation Age of Onset   Heart disease Mother     Allergies  Allergen Reactions   Lisinopril     cough    Outpatient Medications Prior to Visit  Medication Sig Dispense Refill   aspirin EC 81 MG tablet Take 81 mg by mouth daily.      Continuous Blood Gluc Receiver (FREESTYLE LIBRE 14 DAY READER) DEVI 1 Device by Does not apply route 3 (three) times daily. 1 each 0   Continuous Blood Gluc Sensor (FREESTYLE LIBRE 14 DAY SENSOR) MISC 1 Device by Does not apply route every 14 (fourteen) days. Use to check blood sugar daily. Use one sensor for 14 days. 2 each 6   cyclobenzaprine (FLEXERIL) 10 MG tablet Take 1 tablet by mouth 2 (two) times daily as needed for muscle spasms. 60 tablet 0   EPINEPHrine (EPIPEN 2-PAK) 0.3 mg/0.3 mL IJ SOAJ injection Inject  0.3 mLs (0.3 mg total) into the muscle once. 1 Device 0   Insulin Pen Needle (TRUEPLUS 5-BEVEL PEN NEEDLES) 32G X 4 MM MISC Use as instructed. 100 each 2   Insulin Syringe-Needle U-100 (BD INSULIN SYRINGE ULTRAFINE) 31G X 15/64" 0.5 ML MISC 1 each by Does not apply route 2 (two) times daily. 100 each 5   sildenafil (VIAGRA) 25 MG tablet TAKE 2 TABLETS (50 MG TOTAL) BY MOUTH DAILY AS NEEDED FOR ERECTILE DYSFUNCTION. AT LEAST 24 HOURS BETWEEN DOSES 20 tablet 0   amLODipine (NORVASC) 10 MG tablet Take 1 tablet (10 mg total) by mouth daily. 90 tablet 0   atorvastatin (LIPITOR) 20 MG tablet TAKE 1 TABLET (20 MG TOTAL) BY MOUTH DAILY. 90 tablet 0   carvedilol (COREG) 25 MG tablet TAKE 1 TABLET BY MOUTH 2 TIMES DAILY WITH A MEAL. 180 tablet 1   glipiZIDE (GLUCOTROL) 10 MG tablet TAKE 1 TABLET BY MOUTH 2 TIMES DAILY BEFORE A MEAL. 180 tablet 1   insulin isophane & regular human (HUMULIN 70/30 KWIKPEN) (70-30) 100 UNIT/ML KwikPen Inject 50 Units into the skin 2 (two) times daily. 120 mL 3   liraglutide (VICTOZA) 18 MG/3ML SOPN INJECT 0.3 MLS (1.8 MG TOTAL) INTO THE SKIN DAILY. 9 mL 0   losartan (COZAAR) 100 MG  tablet TAKE 1 TABLET (100 MG TOTAL) BY MOUTH DAILY. 30 tablet 0   omega-3 acid ethyl esters (LOVAZA) 1 g capsule Take 3 capsules (3 g total) by mouth daily. 270 capsule 0   spironolactone (ALDACTONE) 25 MG tablet Take 1 tablet (25 mg total) by mouth daily. 30 tablet 0   clotrimazole (LOTRIMIN) 1 % cream Apply 1 application topically 2 (two) times daily. (Patient not taking: Reported on 06/06/2019) 30 g 0   ibuprofen (ADVIL,MOTRIN) 600 MG tablet Take 1 tablet (600 mg total) by mouth every 6 (six) hours as needed. (Patient not taking: Reported on 01/19/2018) 30 tablet 0   No facility-administered medications prior to visit.     ROS Review of Systems  Constitutional:  Negative for activity change and appetite change.  HENT:  Positive for postnasal drip. Negative for sinus pressure and sore throat.    Eyes:  Negative for visual disturbance.  Respiratory:  Negative for cough, chest tightness and shortness of breath.   Cardiovascular:  Negative for chest pain and leg swelling.  Gastrointestinal:  Negative for abdominal distention, abdominal pain, constipation and diarrhea.  Endocrine: Negative.   Genitourinary:  Negative for dysuria.  Musculoskeletal:  Negative for joint swelling and myalgias.  Skin:  Negative for rash.  Allergic/Immunologic: Negative.   Neurological:  Negative for weakness, light-headedness and numbness.  Psychiatric/Behavioral:  Negative for dysphoric mood and suicidal ideas.    Objective:  BP 123/87    Pulse 87    Ht 6' 1"  (1.854 m)    Wt 284 lb 9.6 oz (129.1 kg)    SpO2 98%    BMI 37.55 kg/m   BP/Weight 05/14/2021 10/13/2019 12/04/452  Systolic BP 098 119 147  Diastolic BP 87 72 71  Wt. (Lbs) 284.6 281.6 284  BMI 37.55 37.15 37.47      Physical Exam Constitutional:      Appearance: He is well-developed. He is obese.  Cardiovascular:     Rate and Rhythm: Normal rate.     Heart sounds: Normal heart sounds. No murmur heard. Pulmonary:     Effort: Pulmonary effort is normal.     Breath sounds: Normal breath sounds. No wheezing or rales.  Chest:     Chest wall: No tenderness.  Abdominal:     General: Bowel sounds are normal. There is no distension.     Palpations: Abdomen is soft. There is no mass.     Tenderness: There is no abdominal tenderness.     Hernia: A hernia (Umbilical) is present.  Musculoskeletal:        General: Normal range of motion.     Right lower leg: No edema.     Left lower leg: No edema.  Neurological:     Mental Status: He is alert and oriented to person, place, and time.  Psychiatric:        Mood and Affect: Mood normal.    CMP Latest Ref Rng & Units 09/05/2020 10/13/2019 03/09/2019  Glucose 65 - 99 mg/dL 140(H) 137(H) 172(H)  BUN 6 - 24 mg/dL 9 11 14   Creatinine 0.76 - 1.27 mg/dL 0.93 0.91 0.97  Sodium 134 - 144 mmol/L 145(H)  141 143  Potassium 3.5 - 5.2 mmol/L 3.4(L) 3.7 3.8  Chloride 96 - 106 mmol/L 106 103 103  CO2 20 - 29 mmol/L 25 23 25   Calcium 8.7 - 10.2 mg/dL 9.0 9.3 9.7  Total Protein 6.0 - 8.5 g/dL 6.8 7.4 -  Total Bilirubin 0.0 - 1.2 mg/dL 0.6  0.5 -  Alkaline Phos 44 - 121 IU/L 87 95 -  AST 0 - 40 IU/L 15 19 -  ALT 0 - 44 IU/L 15 17 -    Lipid Panel     Component Value Date/Time   CHOL 133 09/05/2020 1008   TRIG 138 09/05/2020 1008   HDL 26 (L) 09/05/2020 1008   CHOLHDL 5.1 (H) 09/05/2020 1008   CHOLHDL 5.6 (H) 05/27/2016 1024   VLDL 46 (H) 04/26/2015 1054   LDLCALC 82 09/05/2020 1008    CBC    Component Value Date/Time   WBC 13.7 (H) 12/05/2014 1520   RBC 5.50 12/05/2014 1520   HGB 16.9 12/05/2014 1520   HCT 48.2 12/05/2014 1520   PLT 148 (L) 12/05/2014 1520   MCV 87.6 12/05/2014 1520   MCH 30.7 12/05/2014 1520   MCHC 35.1 12/05/2014 1520   RDW 13.1 12/05/2014 1520   LYMPHSABS 2.3 12/05/2014 1520   MONOABS 1.2 (H) 12/05/2014 1520   EOSABS 0.1 12/05/2014 1520   BASOSABS 0.0 12/05/2014 1520    Lab Results  Component Value Date   HGBA1C 7.5 (A) 05/14/2021    Assessment & Plan:  1. Type 2 diabetes mellitus with other specified complication, with long-term current use of insulin (HCC) A1c of 7.4 which is slightly above goal of less than 7.0 We will switch from Victoza which is a daily regimen to Trulicity which is once a week after shared decision making Continue glipizide, NPH Counseled on Diabetic diet, my plate method, 735 minutes of moderate intensity exercise/week Blood sugar logs with fasting goals of 80-120 mg/dl, random of less than 180 and in the event of sugars less than 60 mg/dl or greater than 400 mg/dl encouraged to notify the clinic. Advised on the need for annual eye exams, annual foot exams, Pneumonia vaccine. - POCT glucose (manual entry) - POCT glycosylated hemoglobin (Hb A1C) - Dulaglutide (TRULICITY) 1.5 HG/9.9ME SOPN; Inject 1.5 mg into the skin once a  week.  Dispense: 2 mL; Refill: 6 - Microalbumin / creatinine urine ratio - LP+Non-HDL Cholesterol - CMP14+EGFR - glipiZIDE (GLUCOTROL) 10 MG tablet; TAKE 1 TABLET BY MOUTH 2 TIMES DAILY BEFORE A MEAL.  Dispense: 180 tablet; Refill: 1 - insulin isophane & regular human KwikPen (HUMULIN 70/30 KWIKPEN) (70-30) 100 UNIT/ML KwikPen; Inject 50 Units into the skin 2 (two) times daily.  Dispense: 120 mL; Refill: 6  2. Screening for colon cancer - Fecal occult blood, imunochemical([facility])  3. Mucus pooling in larynx - cetirizine (ZYRTEC) 10 MG tablet; Take 1 tablet (10 mg total) by mouth daily.  Dispense: 30 tablet; Refill: 1  4. Essential hypertension Control Counseled on blood pressure goal of less than 130/80, low-sodium, DASH diet, medication compliance, 150 minutes of moderate intensity exercise per week. Discussed medication compliance, adverse effects. - amLODipine (NORVASC) 10 MG tablet; Take 1 tablet (10 mg total) by mouth daily.  Dispense: 90 tablet; Refill: 1 - carvedilol (COREG) 25 MG tablet; TAKE 1 TABLET BY MOUTH 2 TIMES DAILY WITH A MEAL.  Dispense: 180 tablet; Refill: 1 - glipiZIDE (GLUCOTROL) 10 MG tablet; TAKE 1 TABLET BY MOUTH 2 TIMES DAILY BEFORE A MEAL.  Dispense: 180 tablet; Refill: 1 - losartan (COZAAR) 100 MG tablet; TAKE 1 TABLET (100 MG TOTAL) BY MOUTH DAILY.  Dispense: 90 tablet; Refill: 1 - spironolactone (ALDACTONE) 25 MG tablet; Take 1 tablet (25 mg total) by mouth daily.  Dispense: 90 tablet; Refill: 1  5. Pure hypercholesterolemia Controlled Low-cholesterol diet - atorvastatin (LIPITOR) 20  MG tablet; TAKE 1 TABLET (20 MG TOTAL) BY MOUTH DAILY.  Dispense: 90 tablet; Refill: 0 - glipiZIDE (GLUCOTROL) 10 MG tablet; TAKE 1 TABLET BY MOUTH 2 TIMES DAILY BEFORE A MEAL.  Dispense: 180 tablet; Refill: 1  6. Chronic systolic congestive heart failure (HCC) EF 50 to 55% Euvolemic - spironolactone (ALDACTONE) 25 MG tablet; Take 1 tablet (25 mg total) by mouth  daily.  Dispense: 90 tablet; Refill: 1  7.  Umbilical hernia No evidence of obstruction or pain After shared decision making he will proceed with watchful waiting and notify me if symptoms change  Meds ordered this encounter  Medications   Dulaglutide (TRULICITY) 1.5 YT/1.1NB SOPN    Sig: Inject 1.5 mg into the skin once a week.    Dispense:  2 mL    Refill:  6    Discontinue Victoza   cetirizine (ZYRTEC) 10 MG tablet    Sig: Take 1 tablet (10 mg total) by mouth daily.    Dispense:  30 tablet    Refill:  1   amLODipine (NORVASC) 10 MG tablet    Sig: Take 1 tablet (10 mg total) by mouth daily.    Dispense:  90 tablet    Refill:  1   atorvastatin (LIPITOR) 20 MG tablet    Sig: TAKE 1 TABLET (20 MG TOTAL) BY MOUTH DAILY.    Dispense:  90 tablet    Refill:  0    Must have office visit for refills   carvedilol (COREG) 25 MG tablet    Sig: TAKE 1 TABLET BY MOUTH 2 TIMES DAILY WITH A MEAL.    Dispense:  180 tablet    Refill:  1   glipiZIDE (GLUCOTROL) 10 MG tablet    Sig: TAKE 1 TABLET BY MOUTH 2 TIMES DAILY BEFORE A MEAL.    Dispense:  180 tablet    Refill:  1   insulin isophane & regular human KwikPen (HUMULIN 70/30 KWIKPEN) (70-30) 100 UNIT/ML KwikPen    Sig: Inject 50 Units into the skin 2 (two) times daily.    Dispense:  120 mL    Refill:  6   losartan (COZAAR) 100 MG tablet    Sig: TAKE 1 TABLET (100 MG TOTAL) BY MOUTH DAILY.    Dispense:  90 tablet    Refill:  1   omega-3 acid ethyl esters (LOVAZA) 1 g capsule    Sig: Take 3 capsules (3 g total) by mouth daily.    Dispense:  270 capsule    Refill:  1   spironolactone (ALDACTONE) 25 MG tablet    Sig: Take 1 tablet (25 mg total) by mouth daily.    Dispense:  90 tablet    Refill:  1    Follow-up: Return in about 6 months (around 11/11/2021) for Chronic medical conditions.       Charlott Rakes, MD, FAAFP. [facility] [place], [place]   05/14/2021, 12:30 PM

## 2021-05-15 ENCOUNTER — Other Ambulatory Visit: Payer: Self-pay

## 2021-05-15 LAB — CMP14+EGFR
ALT: 20 IU/L (ref 0–44)
AST: 21 IU/L (ref 0–40)
Albumin/Globulin Ratio: 1.8 (ref 1.2–2.2)
Albumin: 4.8 g/dL (ref 4.0–5.0)
Alkaline Phosphatase: 94 IU/L (ref 44–121)
BUN/Creatinine Ratio: 16 (ref 9–20)
BUN: 15 mg/dL (ref 6–24)
Bilirubin Total: 0.9 mg/dL (ref 0.0–1.2)
CO2: 27 mmol/L (ref 20–29)
Calcium: 9.6 mg/dL (ref 8.7–10.2)
Chloride: 104 mmol/L (ref 96–106)
Creatinine, Ser: 0.95 mg/dL (ref 0.76–1.27)
Globulin, Total: 2.7 g/dL (ref 1.5–4.5)
Glucose: 121 mg/dL — ABNORMAL HIGH (ref 70–99)
Potassium: 3.8 mmol/L (ref 3.5–5.2)
Sodium: 145 mmol/L — ABNORMAL HIGH (ref 134–144)
Total Protein: 7.5 g/dL (ref 6.0–8.5)
eGFR: 99 mL/min/{1.73_m2} (ref >59)

## 2021-05-15 LAB — MICROALBUMIN / CREATININE URINE RATIO
Creatinine, Urine: 472.4 mg/dL
Microalb/Creat Ratio: 12 mg/g creat (ref 0–29)
Microalbumin, Urine: 55.3 ug/mL

## 2021-05-15 LAB — LP+NON-HDL CHOLESTEROL
Cholesterol, Total: 103 mg/dL (ref 100–199)
HDL: 30 mg/dL — ABNORMAL LOW (ref >39)
LDL Chol Calc (NIH): 52 mg/dL (ref 0–99)
Total Non-HDL-Chol (LDL+VLDL): 73 mg/dL (ref 0–129)
Triglycerides: 116 mg/dL (ref 0–149)
VLDL Cholesterol Cal: 21 mg/dL (ref 5–40)

## 2021-05-20 ENCOUNTER — Other Ambulatory Visit: Payer: Self-pay

## 2021-05-20 ENCOUNTER — Other Ambulatory Visit: Payer: Self-pay | Admitting: Family Medicine

## 2021-05-20 DIAGNOSIS — M546 Pain in thoracic spine: Secondary | ICD-10-CM

## 2021-05-20 DIAGNOSIS — G8929 Other chronic pain: Secondary | ICD-10-CM

## 2021-05-21 ENCOUNTER — Other Ambulatory Visit: Payer: Self-pay

## 2021-05-21 MED ORDER — CYCLOBENZAPRINE HCL 10 MG PO TABS
ORAL_TABLET | Freq: Two times a day (BID) | ORAL | 2 refills | Status: DC | PRN
  Filled 2021-05-21: qty 60, 30d supply, fill #0
  Filled 2021-08-27: qty 60, 30d supply, fill #1
  Filled 2021-12-13 – 2022-04-23 (×6): qty 60, 30d supply, fill #2

## 2021-05-21 NOTE — Telephone Encounter (Signed)
Requested medications are due for refill today.  yes  Requested medications are on the active medications list.  yes  Last refill. 04/19/2021 #30 0 refills  Future visit scheduled.   yes  Notes to clinic.  Medication not delegated.    Requested Prescriptions  Pending Prescriptions Disp Refills   cyclobenzaprine (FLEXERIL) 10 MG tablet 60 tablet 0    Sig: Take 1 tablet by mouth 2 (two) times daily as needed for muscle spasms.     Not Delegated - Analgesics:  Muscle Relaxants Failed - 05/20/2021  5:24 PM      Failed - This refill cannot be delegated      Passed - Valid encounter within last 6 months    Recent Outpatient Visits           1 week ago Type 2 diabetes mellitus with other specified complication, with long-term current use of insulin (HCC)   [facility] [place], [name], MD   8 months ago Screening for colon cancer   [facility] [place], [name], MD   1 year ago Type 2 diabetes mellitus with other specified complication, with long-term current use of insulin (HCC)   [facility] [place], [name], MD   1 year ago Gastroenteritis   [facility] [place], [name], MD   2 years ago Type 2 diabetes mellitus with other specified complication, with long-term current use of insulin (HCC)   [facility] [name], MD       Future Appointments             In 5 months [name], MD [facility]

## 2021-05-22 ENCOUNTER — Other Ambulatory Visit: Payer: Self-pay

## 2021-05-23 ENCOUNTER — Other Ambulatory Visit: Payer: Self-pay

## 2021-06-14 ENCOUNTER — Other Ambulatory Visit: Payer: Self-pay

## 2021-06-20 ENCOUNTER — Other Ambulatory Visit: Payer: Self-pay

## 2021-07-16 ENCOUNTER — Other Ambulatory Visit: Payer: Self-pay | Admitting: Family Medicine

## 2021-07-16 ENCOUNTER — Other Ambulatory Visit: Payer: Self-pay

## 2021-07-16 DIAGNOSIS — J387 Other diseases of larynx: Secondary | ICD-10-CM

## 2021-07-16 MED ORDER — CETIRIZINE HCL 10 MG PO TABS
10.0000 mg | ORAL_TABLET | Freq: Every day | ORAL | 2 refills | Status: AC
  Filled 2021-07-16: qty 90, 90d supply, fill #0
  Filled 2021-07-17: qty 30, 30d supply, fill #0
  Filled 2021-10-12: qty 30, 30d supply, fill #1
  Filled 2021-12-03: qty 30, 30d supply, fill #2

## 2021-07-16 NOTE — Telephone Encounter (Signed)
Requested Prescriptions  ?Pending Prescriptions Disp Refills  ?? cetirizine (ALLERGY RELIEF CETIRIZINE) 10 MG tablet 30 tablet 2  ?  Sig: Take 1 tablet (10 mg total) by mouth daily.  ?  ? Ear, Nose, and Throat:  Antihistamines 2 Passed - 07/16/2021  5:04 PM  ?  ?  Passed - Cr in normal range and within 360 days  ?  Creat  ?Date Value Ref Range Status  ?05/27/2016 0.84 0.60 - 1.35 mg/dL Final  ? ?Creatinine, Ser  ?Date Value Ref Range Status  ?05/14/2021 0.95 0.76 - 1.27 mg/dL Final  ? ?Creatinine, Urine  ?Date Value Ref Range Status  ?05/27/2016 371 (H) 20 - 370 mg/dL Final  ?  Comment:  ?  Result repeated and verified. ?Result confirmed by automatic dilution. ?  ?   ?  ?  Passed - Valid encounter within last 12 months  ?  Recent Outpatient Visits   ?      ? 2 months ago Type 2 diabetes mellitus with other specified complication, with long-term current use of insulin (HCC)  ? [facility] [place], [name], MD  ? 10 months ago Screening for colon cancer  ? [facility] [place], [name], MD  ? 1 year ago Type 2 diabetes mellitus with other specified complication, with long-term current use of insulin (HCC)  ? [facility] [name], MD  ? 2 years ago Gastroenteritis  ? [facility] [place], [name], MD  ? 2 years ago Type 2 diabetes mellitus with other specified complication, with long-term current use of insulin (HCC)  ? [facility] [name], MD  ?  ?  ?Future Appointments   ?        ? In 3 months [name], MD [facility]  ?  ? ?  ?  ?  ? ?

## 2021-07-17 ENCOUNTER — Other Ambulatory Visit: Payer: Self-pay

## 2021-07-19 ENCOUNTER — Other Ambulatory Visit: Payer: Self-pay

## 2021-07-24 ENCOUNTER — Other Ambulatory Visit: Payer: Self-pay

## 2021-08-27 ENCOUNTER — Other Ambulatory Visit: Payer: Self-pay

## 2021-08-27 ENCOUNTER — Other Ambulatory Visit: Payer: Self-pay | Admitting: Family Medicine

## 2021-08-27 DIAGNOSIS — E7800 Pure hypercholesterolemia, unspecified: Secondary | ICD-10-CM

## 2021-08-27 MED ORDER — ATORVASTATIN CALCIUM 20 MG PO TABS
ORAL_TABLET | Freq: Every day | ORAL | 2 refills | Status: DC
  Filled 2021-08-27: qty 30, fill #0
  Filled 2021-10-12: qty 30, 30d supply, fill #0
  Filled 2021-12-03: qty 60, 60d supply, fill #1

## 2021-08-29 ENCOUNTER — Other Ambulatory Visit: Payer: Self-pay

## 2021-09-02 ENCOUNTER — Other Ambulatory Visit: Payer: Self-pay

## 2021-09-06 ENCOUNTER — Other Ambulatory Visit: Payer: Self-pay

## 2021-09-16 ENCOUNTER — Other Ambulatory Visit: Payer: Self-pay

## 2021-09-23 ENCOUNTER — Other Ambulatory Visit: Payer: Self-pay

## 2021-09-30 ENCOUNTER — Other Ambulatory Visit: Payer: Self-pay

## 2021-10-07 ENCOUNTER — Other Ambulatory Visit: Payer: Self-pay

## 2021-10-07 ENCOUNTER — Encounter (HOSPITAL_COMMUNITY): Payer: Self-pay

## 2021-10-07 ENCOUNTER — Emergency Department (HOSPITAL_COMMUNITY)
Admission: EM | Admit: 2021-10-07 | Discharge: 2021-10-07 | Disposition: A | Discharge status: Home / Self Care | Payer: Self-pay | Attending: Emergency Medicine | Admitting: Emergency Medicine

## 2021-10-07 DIAGNOSIS — Z7982 Long term (current) use of aspirin: Secondary | ICD-10-CM | POA: Insufficient documentation

## 2021-10-07 DIAGNOSIS — H6121 Impacted cerumen, right ear: Secondary | ICD-10-CM | POA: Insufficient documentation

## 2021-10-07 MED ORDER — HYDROGEN PEROXIDE 3 % EX SOLN
CUTANEOUS | Status: AC
  Filled 2021-10-07: qty 473

## 2021-10-07 MED ORDER — IBUPROFEN 800 MG PO TABS
800.0000 mg | ORAL_TABLET | Freq: Once | ORAL | Status: AC
  Administered 2021-10-07: 800 mg via ORAL
  Filled 2021-10-07: qty 1

## 2021-10-07 MED ORDER — CARBAMIDE PEROXIDE 6.5 % OT SOLN
5.0000 [drp] | Freq: Two times a day (BID) | OTIC | 0 refills | Status: AC
Start: 2021-10-07 — End: ?
  Filled 2021-10-07 – 2021-12-13 (×3): qty 15, 30d supply, fill #0

## 2021-10-07 NOTE — ED Provider Notes (Signed)
[facility]-EMERGENCY DEPT Provider Note   CSN: [id number] Arrival date & time: 10/07/21  2001     History  Chief Complaint  Patient presents with   Otalgia    Dave Arnold is a 51 y.o. male.  51 year old male presents with complaint of discomfort in his right ear.  Patient notes 5 days ago prior to onset of his ear discomfort he was using a Q-tip to clean his ear and questions if he has something stuck in his ear.  He reports a buzzing sensation in the ear.  Denies other trauma or injury to the ear, denies drainage from the ear, has not been sitting in a pool or lake recently.       Home Medications Prior to Admission medications   Medication Sig Start Date End Date Taking? Authorizing Provider  carbamide peroxide (DEBROX) 6.5 % OTIC solution Place 5 drops into the right ear 2 (two) times daily. 10/07/21  Yes Jeannie Fend, PA-C  amLODipine (NORVASC) 10 MG tablet Take 1 tablet (10 mg total) by mouth daily. 05/14/21 10/22/21  [name], MD  aspirin EC 81 MG tablet Take 81 mg by mouth daily.     [provider]  atorvastatin (LIPITOR) 20 MG tablet TAKE 1 TABLET (20 MG TOTAL) BY MOUTH DAILY. 08/27/21   [name], MD  carvedilol (COREG) 25 MG tablet TAKE 1 TABLET BY MOUTH 2 TIMES DAILY WITH A MEAL. 05/14/21 05/14/22  [name], MD  cetirizine (ALLERGY RELIEF CETIRIZINE) 10 MG tablet Take 1 tablet (10 mg total) by mouth daily. 07/16/21   [name], MD  clotrimazole (LOTRIMIN) 1 % cream Apply 1 application topically 2 (two) times daily. Patient not taking: Reported on 06/06/2019 01/19/18   [name], MD  Continuous Blood Gluc Receiver (FREESTYLE LIBRE 14 DAY READER) DEVI 1 Device by Does not apply route 3 (three) times daily. 03/08/19   [name], MD  Continuous Blood Gluc Sensor (FREESTYLE LIBRE 14 DAY SENSOR) MISC 1 Device by Does not apply route every 14 (fourteen) days. Use to check blood sugar daily. Use one sensor for  14 days. 03/11/19   [name], MD  cyclobenzaprine (FLEXERIL) 10 MG tablet Take 1 tablet by mouth 2 (two) times daily as needed for muscle spasms. 05/21/21 05/21/22  [name], MD  Dulaglutide (TRULICITY) 1.5 MG/0.5ML SOPN Inject 1.5 mg into the skin once a week. 05/14/21   [name], MD  EPINEPHrine (EPIPEN 2-PAK) 0.3 mg/0.3 mL IJ SOAJ injection Inject 0.3 mLs (0.3 mg total) into the muscle once. 12/05/14   Melene Plan, DO  glipiZIDE (GLUCOTROL) 10 MG tablet TAKE 1 TABLET BY MOUTH 2 TIMES DAILY BEFORE A MEAL. 05/14/21 05/14/22  [name], MD  ibuprofen (ADVIL,MOTRIN) 600 MG tablet Take 1 tablet (600 mg total) by mouth every 6 (six) hours as needed. Patient not taking: Reported on 01/19/2018 07/19/16   Elpidio Anis, PA-C  insulin isophane & regular human KwikPen (HUMULIN 70/30 KWIKPEN) (70-30) 100 UNIT/ML KwikPen Inject 50 Units into the skin 2 (two) times daily. 05/14/21   [name], MD  Insulin Pen Needle (TRUEPLUS 5-BEVEL PEN NEEDLES) 32G X 4 MM MISC Use as instructed. 12/09/19   [name], MD  Insulin Syringe-Needle U-100 (BD INSULIN SYRINGE ULTRAFINE) 31G X 15/64" 0.5 ML MISC 1 each by Does not apply route 2 (two) times daily. 03/16/17   Anders Simmonds, PA-C  losartan (COZAAR) 100 MG tablet TAKE 1 TABLET (100 MG TOTAL) BY MOUTH DAILY. 05/14/21  05/14/22  [name], MD  omega-3 acid ethyl esters (LOVAZA) 1 g capsule Take 3 capsules (3 g total) by mouth daily. 05/14/21 10/22/21  [name], MD  sildenafil (VIAGRA) 25 MG tablet TAKE 2 TABLETS (50 MG TOTAL) BY MOUTH DAILY AS NEEDED FOR ERECTILE DYSFUNCTION. AT LEAST 24 HOURS BETWEEN DOSES 08/31/20 08/31/21  [name], MD  spironolactone (ALDACTONE) 25 MG tablet Take 1 tablet (25 mg total) by mouth daily. 05/14/21 11/28/21  [name], MD      Allergies    Lisinopril    Review of Systems   Review of Systems Negative except as per HPI Physical Exam Updated Vital Signs BP (!) 142/87 (BP Location: Left  Arm)   Pulse 66   Temp 98 F (36.7 C) (Oral)   Resp 16   Ht 6' (1.829 m)   Wt 124.7 kg   SpO2 100%   BMI 37.30 kg/m  Physical Exam Vitals and nursing note reviewed.  Constitutional:      General: He is not in acute distress.    Appearance: He is well-developed. He is not diaphoretic.  HENT:     Head: Normocephalic and atraumatic.     Right Ear: There is impacted cerumen.     Left Ear: Tympanic membrane and ear canal normal.     Nose: Nose normal.     Mouth/Throat:     Mouth: Mucous membranes are moist.  Pulmonary:     Effort: Pulmonary effort is normal.  Musculoskeletal:     Cervical back: Neck supple.  Skin:    General: Skin is warm and dry.     Findings: No erythema or rash.  Neurological:     Mental Status: He is alert and oriented to person, place, and time.  Psychiatric:        Behavior: Behavior normal.     ED Results / Procedures / Treatments   Labs (all labs ordered are listed, but only abnormal results are displayed) Labs Reviewed - No data to display  EKG None  Radiology No results found.  Procedures Procedures    Medications Ordered in ED Medications - No data to display  ED Course/ Medical Decision Making/ A&P                           Medical Decision Making  51 year old male with complaint of right ear discomfort as above.  He is found to have impacted cerumen in the right ear.  Plan is to attempt to irrigate ear and remove cerumen while in the ED.  If unsuccessful, will be discharged with Debrox drops to continue to soften wax at home and follow-up with PCP for further irrigation.        Final Clinical Impression(s) / ED Diagnoses Final diagnoses:  Impacted cerumen of right ear    Rx / DC Orders ED Discharge Orders          Ordered    carbamide peroxide (DEBROX) 6.5 % OTIC solution  2 times daily        10/07/21 2038              Jeannie Fend, PA-C 10/07/21 2038    Pricilla Loveless, MD 10/07/21 2303

## 2021-10-07 NOTE — ED Triage Notes (Signed)
Pt complains of right ear pain x 5 days.

## 2021-10-07 NOTE — Discharge Instructions (Signed)
Can use peroxide or Debrox drops in the ear to soften wax and irrigate at home if needed.  Follow-up with your primary care provider as needed.

## 2021-10-08 ENCOUNTER — Other Ambulatory Visit: Payer: Self-pay

## 2021-10-10 ENCOUNTER — Other Ambulatory Visit: Payer: Self-pay

## 2021-10-11 ENCOUNTER — Other Ambulatory Visit: Payer: Self-pay

## 2021-10-12 ENCOUNTER — Other Ambulatory Visit: Payer: Self-pay | Admitting: Family Medicine

## 2021-10-12 DIAGNOSIS — N528 Other male erectile dysfunction: Secondary | ICD-10-CM

## 2021-10-14 ENCOUNTER — Other Ambulatory Visit: Payer: Self-pay

## 2021-10-14 MED ORDER — SILDENAFIL CITRATE 25 MG PO TABS
ORAL_TABLET | ORAL | 0 refills | Status: DC
  Filled 2021-10-14: qty 20, 30d supply, fill #0

## 2021-10-14 NOTE — Telephone Encounter (Signed)
Requested Prescriptions  Pending Prescriptions Disp Refills  . sildenafil (VIAGRA) 25 MG tablet 20 tablet 0    Sig: TAKE 2 TABLETS (50 MG TOTAL) BY MOUTH DAILY AS NEEDED FOR ERECTILE DYSFUNCTION. AT LEAST 24 HOURS BETWEEN DOSES     Urology: Erectile Dysfunction Agents Failed - 10/12/2021  5:47 AM      Failed - Last BP in normal range    BP Readings from Last 1 Encounters:  10/07/21 (!) 142/87         Passed - AST in normal range and within 360 days    AST  Date Value Ref Range Status  05/14/2021 21 0 - 40 IU/L Final         Passed - ALT in normal range and within 360 days    ALT  Date Value Ref Range Status  05/14/2021 20 0 - 44 IU/L Final         Passed - Valid encounter within last 12 months    Recent Outpatient Visits          5 months ago Type 2 diabetes mellitus with other specified complication, with long-term current use of insulin (HCC)   [name] [name], [name], MD   1 year ago Screening for colon cancer   [name] [name], [name], MD   2 years ago Type 2 diabetes mellitus with other specified complication, with long-term current use of insulin (HCC)   [name] [name], [name], MD   2 years ago Gastroenteritis   [name] [name], [name], MD   2 years ago Type 2 diabetes mellitus with other specified complication, with long-term current use of insulin (HCC)   [name] [name], MD      Future Appointments            In 4 weeks [name], MD [facility]

## 2021-10-31 ENCOUNTER — Other Ambulatory Visit: Payer: Self-pay

## 2021-11-07 ENCOUNTER — Other Ambulatory Visit: Payer: Self-pay

## 2021-11-11 ENCOUNTER — Ambulatory Visit: Payer: Self-pay | Admitting: Family Medicine

## 2021-11-13 ENCOUNTER — Other Ambulatory Visit: Payer: Self-pay

## 2021-12-03 ENCOUNTER — Other Ambulatory Visit: Payer: Self-pay | Admitting: Family Medicine

## 2021-12-03 ENCOUNTER — Other Ambulatory Visit: Payer: Self-pay

## 2021-12-03 DIAGNOSIS — N528 Other male erectile dysfunction: Secondary | ICD-10-CM

## 2021-12-03 DIAGNOSIS — I5022 Chronic systolic (congestive) heart failure: Secondary | ICD-10-CM

## 2021-12-03 DIAGNOSIS — I10 Essential (primary) hypertension: Secondary | ICD-10-CM

## 2021-12-05 ENCOUNTER — Telehealth: Payer: Self-pay | Admitting: Family Medicine

## 2021-12-05 ENCOUNTER — Other Ambulatory Visit: Payer: Self-pay

## 2021-12-05 DIAGNOSIS — I5022 Chronic systolic (congestive) heart failure: Secondary | ICD-10-CM

## 2021-12-05 DIAGNOSIS — I10 Essential (primary) hypertension: Secondary | ICD-10-CM

## 2021-12-05 NOTE — Telephone Encounter (Signed)
missed his appt 8/14 needs a refill on his losartan and spironalactone , he has an appt 9/13 and he said he will be there. should he wait until the appt or these need to be refilled today ?

## 2021-12-05 NOTE — Telephone Encounter (Signed)
Pt requesting refill until his 9/13 appointment.

## 2021-12-06 ENCOUNTER — Other Ambulatory Visit: Payer: Self-pay

## 2021-12-06 MED ORDER — SPIRONOLACTONE 25 MG PO TABS
25.0000 mg | ORAL_TABLET | Freq: Every day | ORAL | 0 refills | Status: DC
  Filled 2021-12-06: qty 30, 30d supply, fill #0

## 2021-12-06 MED ORDER — LOSARTAN POTASSIUM 100 MG PO TABS
ORAL_TABLET | Freq: Every day | ORAL | 0 refills | Status: DC
  Filled 2021-12-06: qty 30, 30d supply, fill #0

## 2021-12-06 NOTE — Telephone Encounter (Signed)
Refilled

## 2021-12-06 NOTE — Addendum Note (Signed)
Addended by: [name] on: 12/06/2021 11:44 AM   Modules accepted: Orders

## 2021-12-06 NOTE — Telephone Encounter (Signed)
Call placed to patient and VM was left informing patient that refills has been sent and to keep upcoming appointment.

## 2021-12-11 ENCOUNTER — Other Ambulatory Visit: Payer: Self-pay

## 2021-12-11 ENCOUNTER — Encounter: Payer: Self-pay | Admitting: Family Medicine

## 2021-12-11 ENCOUNTER — Ambulatory Visit: Payer: Self-pay | Attending: Family Medicine | Admitting: Family Medicine

## 2021-12-11 VITALS — BP 121/80 | HR 74 | Temp 98.1°F | Ht 73.0 in | Wt 282.0 lb

## 2021-12-11 DIAGNOSIS — I5022 Chronic systolic (congestive) heart failure: Secondary | ICD-10-CM

## 2021-12-11 DIAGNOSIS — Z1211 Encounter for screening for malignant neoplasm of colon: Secondary | ICD-10-CM

## 2021-12-11 DIAGNOSIS — M62838 Other muscle spasm: Secondary | ICD-10-CM

## 2021-12-11 DIAGNOSIS — I10 Essential (primary) hypertension: Secondary | ICD-10-CM

## 2021-12-11 DIAGNOSIS — Z794 Long term (current) use of insulin: Secondary | ICD-10-CM

## 2021-12-11 DIAGNOSIS — E7800 Pure hypercholesterolemia, unspecified: Secondary | ICD-10-CM

## 2021-12-11 DIAGNOSIS — E1169 Type 2 diabetes mellitus with other specified complication: Secondary | ICD-10-CM

## 2021-12-11 LAB — POCT GLYCOSYLATED HEMOGLOBIN (HGB A1C): HbA1c, POC (controlled diabetic range): 6.7 % (ref 0.0–7.0)

## 2021-12-11 LAB — GLUCOSE, POCT (MANUAL RESULT ENTRY): POC Glucose: 80 mg/dl (ref 70–99)

## 2021-12-11 MED ORDER — GLIPIZIDE 10 MG PO TABS
ORAL_TABLET | ORAL | 1 refills | Status: DC
  Filled 2021-12-11: qty 180, 90d supply, fill #0
  Filled 2022-01-20: qty 60, 30d supply, fill #0
  Filled 2022-04-16 – 2022-04-23 (×3): qty 60, 30d supply, fill #1
  Filled 2022-05-23 (×3): qty 60, 30d supply, fill #2
  Filled 2022-07-09: qty 60, 30d supply, fill #3

## 2021-12-11 MED ORDER — LOSARTAN POTASSIUM 100 MG PO TABS
ORAL_TABLET | Freq: Every day | ORAL | 1 refills | Status: DC
  Filled 2022-01-20: qty 90, 90d supply, fill #0
  Filled 2022-05-23 (×2): qty 90, 90d supply, fill #1

## 2021-12-11 MED ORDER — CARVEDILOL 25 MG PO TABS
25.0000 mg | ORAL_TABLET | Freq: Two times a day (BID) | ORAL | 1 refills | Status: DC
  Filled 2021-12-11: qty 180, fill #0
  Filled 2022-07-09: qty 180, 90d supply, fill #0

## 2021-12-11 MED ORDER — ATORVASTATIN CALCIUM 20 MG PO TABS
20.0000 mg | ORAL_TABLET | Freq: Every day | ORAL | 1 refills | Status: DC
  Filled 2021-12-11 – 2022-03-11 (×5): qty 90, 90d supply, fill #0
  Filled 2022-05-23 (×2): qty 90, 90d supply, fill #1

## 2021-12-11 MED ORDER — HUMULIN 70/30 KWIKPEN (70-30) 100 UNIT/ML ~~LOC~~ SUPN
50.0000 [IU] | PEN_INJECTOR | Freq: Two times a day (BID) | SUBCUTANEOUS | 6 refills | Status: DC
  Filled 2021-12-11: qty 120, 120d supply, fill #0
  Filled 2022-02-12: qty 90, 90d supply, fill #0
  Filled 2022-03-11: qty 120, 120d supply, fill #0
  Filled 2022-07-09: qty 120, 120d supply, fill #1

## 2021-12-11 MED ORDER — TRULICITY 1.5 MG/0.5ML ~~LOC~~ SOAJ
1.5000 mg | SUBCUTANEOUS | 6 refills | Status: DC
  Filled 2021-12-11 – 2022-01-20 (×2): qty 2, 28d supply, fill #0
  Filled 2022-02-12 – 2022-03-11 (×2): qty 2, 28d supply, fill #1
  Filled 2022-04-16 – 2022-04-23 (×3): qty 2, 28d supply, fill #2
  Filled 2022-05-23 (×2): qty 2, 28d supply, fill #3
  Filled 2022-07-09: qty 2, 28d supply, fill #4
  Filled 2022-08-21: qty 2, 28d supply, fill #5
  Filled 2022-10-02: qty 2, 28d supply, fill #6

## 2021-12-11 MED ORDER — SPIRONOLACTONE 25 MG PO TABS
25.0000 mg | ORAL_TABLET | Freq: Every day | ORAL | 0 refills | Status: DC
  Filled 2022-01-20: qty 30, 30d supply, fill #0

## 2021-12-11 MED ORDER — AMLODIPINE BESYLATE 10 MG PO TABS
10.0000 mg | ORAL_TABLET | Freq: Every day | ORAL | 1 refills | Status: DC
  Filled 2021-12-11 – 2022-01-20 (×3): qty 90, 90d supply, fill #0
  Filled 2022-05-23 (×3): qty 90, 90d supply, fill #1

## 2021-12-11 NOTE — Patient Instructions (Signed)
Muscle Cramps and Spasms Muscle cramps and spasms occur when a muscle or muscles tighten and you have no control over this tightening (involuntary muscle contraction). They are a common problem and can develop in any muscle. The most common place is in the calf muscles of the leg. Muscle cramps and muscle spasms are both involuntary muscle contractions, but there are some differences between the two: Muscle cramps are painful. They come and go and may last for a few seconds or up to 15 minutes. Muscle cramps are often more forceful and last longer than muscle spasms. Muscle spasms may or may not be painful. They may also last just a few seconds or much longer. Certain medical conditions, such as diabetes or Parkinson's disease, can make it more likely to develop cramps or spasms. However, cramps or spasms are usually not caused by a serious underlying problem. Common causes include: Doing more physical work or exercise than your body is ready for (overexertion). Overuse from repeating certain movements too many times. Remaining in a certain position for a long period of time. Improper preparation, form, or technique while playing a sport or doing an activity. Dehydration. Injury. Side effects of some medicines. Abnormally low levels of the salts and minerals in your blood (electrolytes), especially potassium and calcium. This could happen if you are taking water pills (diuretics) or if you are pregnant. In many cases, the cause of muscle cramps or spasms is not known. Follow these instructions at home: Managing pain and stiffness     Try massaging, stretching, and relaxing the affected muscle. Do this for several minutes at a time. If directed, apply heat to tight or tense muscles as often as told by your health care provider. Use the heat source that your health care provider recommends, such as a moist heat pack or a heating pad. Place a towel between your skin and the heat source. Leave the  heat on for 20-30 minutes. Remove the heat if your skin turns bright red. This is especially important if you are unable to feel pain, heat, or cold. You may have a greater risk of getting burned. If directed, put ice on the affected area. This may help if you are sore or have pain after a cramp or spasm. Put ice in a plastic bag. Place a towel between your skin and the bag. Leave the ice on for 20 minutes, 2-3 times a day. Try taking hot showers or baths to help relax tight muscles. Eating and drinking Drink enough fluid to keep your urine pale yellow. Staying well hydrated may help prevent cramps or spasms. Eat a healthy diet that includes plenty of nutrients to help your muscles function. A healthy diet includes fruits and vegetables, lean protein, whole grains, and low-fat or nonfat dairy products. General instructions If you are having frequent cramps, avoid intense exercise for several days. Take over-the-counter and prescription medicines only as told by your health care provider. Pay attention to any changes in your symptoms. Keep all follow-up visits as told by your health care provider. This is important. Contact a health care provider if: Your cramps or spasms get more severe or happen more often. Your cramps or spasms do not improve over time. Summary Muscle cramps and spasms occur when a muscle or muscles tighten and you have no control over this tightening (involuntary muscle contraction). The most common place for cramps or spasms to occur is in the calf muscles of the leg. Massaging, stretching, and relaxing the affected   muscle may relieve the cramp or spasm. Drink enough fluid to keep your urine pale yellow. Staying well hydrated may help prevent cramps or spasms. This information is not intended to replace advice given to you by your health care provider. Make sure you discuss any questions you have with your health care provider. Document Revised: 10/05/2020 Document  Reviewed: 10/05/2020 Elsevier Patient Education  2023 Elsevier Inc.  

## 2021-12-11 NOTE — Progress Notes (Signed)
Subjective:  Patient ID: Dave PattyKevin R Joos, male    DOB: 1970-11-27  Age: 51 y.o. MRN: [id number]  CC: Diabetes   HPI Dave Arnold is a 51 y.o. year old male with a history of type 2 diabetes mellitus (A1c 6.7), hypertension, hyperlipidemia, CHF (EF 50-55% from 06/2013), obesity who is seen for a follow-up visit.  Interval History:  He is going through a lot with breaking up with his Girlfriend and being kicked out of her house and he is somewhat depressed.  He declines any intervention by means of medication or counseling.  States he is trying to reach out to someone to help him with housing resources and also possibly thinking of applying for disability.  He has intermittent muscle spasms and his muscle relaxants make him sleepy.  Pain occurs when he lies on his back and attempts to change position and this is certainly followed by some tremors.  Pain does not radiate down his extremities and he denies history of falls or weakness of his legs. He does endorse heavy lifting at a previous job which she no longer does.  Diabetes is controlled and he is doing well on Trulicity and his other medications.  He has no hypoglycemic episodes or neuropathy. Not up-to-date on annual eye exams. Doing well on his statin. From a cardiac standpoint he has no dyspnea or chest pains. Past Medical History:  Diagnosis Date   Diabetes mellitus without complication (HCC)    Heart failure of unknown type (HCC) 06/2011   Hypertension    Obesity    Thrombocytopenia (HCC)     No past surgical history on file.  Family History  Problem Relation Age of Onset   Heart disease Mother     Social History   Socioeconomic History   Marital status: Single    Spouse name: Not on file   Number of children: Not on file   Years of education: Not on file   Highest education level: Not on file  Occupational History   Not on file  Tobacco Use   Smoking status: Never   Smokeless tobacco: Never  Vaping  Use   Vaping Use: Never used  Substance and Sexual Activity   Alcohol use: No   Drug use: No   Sexual activity: Not on file  Other Topics Concern   Not on file  Social History Narrative   He is married and has two daughters, was in prison years ago but released in 2008. Denies any smoking, drinking, or drugs.    Social Determinants of Health   Financial Resource Strain: Not on file  Food Insecurity: Not on file  Transportation Needs: Not on file  Physical Activity: Not on file  Stress: Not on file  Social Connections: Not on file    Allergies  Allergen Reactions   Lisinopril     cough    Outpatient Medications Prior to Visit  Medication Sig Dispense Refill   aspirin EC 81 MG tablet Take 81 mg by mouth daily.      carbamide peroxide (DEBROX) 6.5 % OTIC solution Place 5 drops into the right ear 2 (two) times daily. 15 mL 0   cetirizine (ALLERGY RELIEF CETIRIZINE) 10 MG tablet Take 1 tablet (10 mg total) by mouth daily. 30 tablet 2   clotrimazole (LOTRIMIN) 1 % cream Apply 1 application topically 2 (two) times daily. 30 g 0   Continuous Blood Gluc Receiver (FREESTYLE LIBRE 14 DAY READER) DEVI 1 Device by Does not  apply route 3 (three) times daily. 1 each 0   Continuous Blood Gluc Sensor (FREESTYLE LIBRE 14 DAY SENSOR) MISC 1 Device by Does not apply route every 14 (fourteen) days. Use to check blood sugar daily. Use one sensor for 14 days. 2 each 6   cyclobenzaprine (FLEXERIL) 10 MG tablet Take 1 tablet by mouth 2 (two) times daily as needed for muscle spasms. 60 tablet 2   EPINEPHrine (EPIPEN 2-PAK) 0.3 mg/0.3 mL IJ SOAJ injection Inject 0.3 mLs (0.3 mg total) into the muscle once. 1 Device 0   ibuprofen (ADVIL,MOTRIN) 600 MG tablet Take 1 tablet (600 mg total) by mouth every 6 (six) hours as needed. 30 tablet 0   Insulin Pen Needle (TRUEPLUS 5-BEVEL PEN NEEDLES) 32G X 4 MM MISC Use as instructed. 100 each 2   Insulin Syringe-Needle U-100 (BD INSULIN SYRINGE ULTRAFINE) 31G X  15/64" 0.5 ML MISC 1 each by Does not apply route 2 (two) times daily. 100 each 5   omega-3 acid ethyl esters (LOVAZA) 1 g capsule Take 3 capsules (3 g total) by mouth daily. 270 capsule 1   sildenafil (VIAGRA) 25 MG tablet TAKE 2 TABLETS (50 MG TOTAL) BY MOUTH DAILY AS NEEDED FOR ERECTILE DYSFUNCTION. AT LEAST 24 HOURS BETWEEN DOSES 20 tablet 0   amLODipine (NORVASC) 10 MG tablet Take 1 tablet (10 mg total) by mouth daily. 90 tablet 1   atorvastatin (LIPITOR) 20 MG tablet TAKE 1 TABLET (20 MG TOTAL) BY MOUTH DAILY. (Please keep upcoming office visit for refills) 30 tablet 2   carvedilol (COREG) 25 MG tablet TAKE 1 TABLET BY MOUTH 2 TIMES DAILY WITH A MEAL. 180 tablet 1   Dulaglutide (TRULICITY) 1.5 MG/0.5ML SOPN Inject 1.5 mg into the skin once a week. 2 mL 6   glipiZIDE (GLUCOTROL) 10 MG tablet TAKE 1 TABLET BY MOUTH 2 TIMES DAILY BEFORE A MEAL. 180 tablet 1   insulin isophane & regular human KwikPen (HUMULIN 70/30 KWIKPEN) (70-30) 100 UNIT/ML KwikPen Inject 50 Units into the skin 2 (two) times daily. 120 mL 6   losartan (COZAAR) 100 MG tablet TAKE 1 TABLET (100 MG TOTAL) BY MOUTH DAILY. 30 tablet 0   spironolactone (ALDACTONE) 25 MG tablet Take 1 tablet (25 mg total) by mouth daily. 30 tablet 0   No facility-administered medications prior to visit.     ROS Review of Systems  Constitutional:  Negative for activity change and appetite change.  HENT:  Negative for sinus pressure and sore throat.   Eyes:  Negative for visual disturbance.  Respiratory:  Negative for cough, chest tightness and shortness of breath.   Cardiovascular:  Negative for chest pain and leg swelling.  Gastrointestinal:  Negative for abdominal distention, abdominal pain, constipation and diarrhea.  Endocrine: Negative.   Genitourinary:  Negative for dysuria.  Musculoskeletal:        See HPI  Skin:  Negative for rash.  Allergic/Immunologic: Negative.   Neurological:  Negative for weakness, light-headedness and  numbness.  Psychiatric/Behavioral:  Negative for dysphoric mood and suicidal ideas.     Objective:  BP 121/80   Pulse 74   Temp 98.1 F (36.7 C) (Oral)   Ht 6\' 1"  (1.854 m)   Wt 282 lb (127.9 kg)   SpO2 97%   BMI 37.21 kg/m      12/11/2021    4:09 PM 10/07/2021    8:07 PM 05/14/2021   11:12 AM  BP/Weight  Systolic BP 121 142 123  Diastolic BP 80  87 87  Wt. (Lbs) 282 275 284.6  BMI 37.21 kg/m2 37.3 kg/m2 37.55 kg/m2      Physical Exam Constitutional:      Appearance: He is well-developed. He is obese.  Cardiovascular:     Rate and Rhythm: Normal rate.     Heart sounds: Normal heart sounds. No murmur heard. Pulmonary:     Effort: Pulmonary effort is normal.     Breath sounds: Normal breath sounds. No wheezing or rales.  Chest:     Chest wall: No tenderness.  Abdominal:     General: Bowel sounds are normal. There is no distension.     Palpations: Abdomen is soft. There is no mass.     Tenderness: There is no abdominal tenderness.  Musculoskeletal:     Right lower leg: No edema.     Left lower leg: No edema.     Comments: No tenderness on palpation of lumbar spine Tenderness elicited on flexion and extension of spine with difficulty rising from supine position Positive straight leg raise on the right  Neurological:     Mental Status: He is alert and oriented to person, place, and time.  Psychiatric:        Mood and Affect: Mood normal.        Latest Ref Rng & Units 05/14/2021   11:50 AM 09/05/2020   10:08 AM 10/13/2019   11:00 AM  CMP  Glucose 70 - 99 mg/dL 026  378  588   BUN 6 - 24 mg/dL 15  9  11    Creatinine 0.76 - 1.27 mg/dL  5.02  7.74   Sodium 134 - 144 mmol/L 145  145  141   Potassium 3.5 - 5.2 mmol/L 3.8  3.4  3.7   Chloride 96 - 106 mmol/L 104  106  103   CO2 20 - 29 mmol/L 27  25  23    Calcium 8.7 - 10.2 mg/dL 9.6  9.0  9.3   Total Protein 6.0 - 8.5 g/dL 7.5  6.8  7.4   Total Bilirubin 0.0 - 1.2 mg/dL 0.9  0.6  0.5   Alkaline Phos 44 - 121  IU/L 94  87  95   AST 0 - 40 IU/L 21  15  19    ALT 0 - 44 IU/L 20  15  17      Lipid Panel     Component Value Date/Time   CHOL 103 05/14/2021 1150   TRIG 116 05/14/2021 1150   HDL 30 (L) 05/14/2021 1150   CHOLHDL 5.1 (H) 09/05/2020 1008   CHOLHDL 5.6 (H) 05/27/2016 1024   VLDL 46 (H) 04/26/2015 1054   LDLCALC 52 05/14/2021 1150    CBC    Component Value Date/Time   WBC 13.7 (H) 12/05/2014 1520   RBC 5.50 12/05/2014 1520   HGB 16.9 12/05/2014 1520   HCT 48.2 12/05/2014 1520   PLT 148 (L) 12/05/2014 1520   MCV 87.6 12/05/2014 1520   MCH 30.7 12/05/2014 1520   MCHC 35.1 12/05/2014 1520   RDW 13.1 12/05/2014 1520   LYMPHSABS 2.3 12/05/2014 1520   MONOABS 1.2 (H) 12/05/2014 1520   EOSABS 0.1 12/05/2014 1520   BASOSABS 0.0 12/05/2014 1520    Lab Results  Component Value Date   HGBA1C 6.7 12/11/2021    Assessment & Plan:  1. Type 2 diabetes mellitus with other specified complication, with long-term current use of insulin (HCC) Controlled with A1c of 6.7 Continue current regimen Counseled on Diabetic  diet, my plate method, 161 minutes of moderate intensity exercise/week Blood sugar logs with fasting goals of 80-120 mg/dl, random of less than 096 and in the event of sugars less than 60 mg/dl or greater than 045 mg/dl encouraged to notify the clinic. Advised on the need for annual eye exams, annual foot exams, Pneumonia vaccine. - POCT glucose (manual entry) - POCT glycosylated hemoglobin (Hb A1C) - Basic Metabolic Panel - glipiZIDE (GLUCOTROL) 10 MG tablet; TAKE 1 TABLET BY MOUTH 2 TIMES DAILY BEFORE A MEAL.  Dispense: 180 tablet; Refill: 1 - insulin isophane & regular human KwikPen (HUMULIN 70/30 KWIKPEN) (70-30) 100 UNIT/ML KwikPen; Inject 50 Units into the skin 2 (two) times daily.  Dispense: 120 mL; Refill: 6 - Dulaglutide (TRULICITY) 1.5 MG/0.5ML SOPN; Inject 1.5 mg into the skin once a week.  Dispense: 2 mL; Refill: 6  2. Essential  hypertension Controlled Counseled on blood pressure goal of less than 130/80, low-sodium, DASH diet, medication compliance, 150 minutes of moderate intensity exercise per week. Discussed medication compliance, adverse effects. - amLODipine (NORVASC) 10 MG tablet; Take 1 tablet (10 mg total) by mouth daily.  Dispense: 90 tablet; Refill: 1 - carvedilol (COREG) 25 MG tablet; TAKE 1 TABLET BY MOUTH 2 TIMES DAILY WITH A MEAL.  Dispense: 180 tablet; Refill: 1 - losartan (COZAAR) 100 MG tablet; TAKE 1 TABLET (100 MG TOTAL) BY MOUTH DAILY.  Dispense: 90 tablet; Refill: 1 - spironolactone (ALDACTONE) 25 MG tablet; Take 1 tablet (25 mg total) by mouth daily.  Dispense: 30 tablet; Refill: 0  3. Pure hypercholesterolemia Controlled Slightly low HDL and has been advised to work on exercising - atorvastatin (LIPITOR) 20 MG tablet; Take 1 tablet (20 mg total) by mouth daily.  Dispense: 90 tablet; Refill: 1  4. Muscle spasm Continue muscle relaxant Advised to apply heat or ice whichever is tolerated to painful areas. Counseled on evidence of improvement in pain control with regards to yoga, water aerobics, massage, home physical therapy, exercise as tolerated. - Ambulatory referral to Physical Therapy  5. Chronic systolic congestive heart failure (HCC) Euvolemic with EF of 50 to 55% Consider echo at next visit - spironolactone (ALDACTONE) 25 MG tablet; Take 1 tablet (25 mg total) by mouth daily.  Dispense: 30 tablet; Refill: 0  6. Screening for colon cancer - Fecal occult blood, imunochemical([facility])    Meds ordered this encounter  Medications   amLODipine (NORVASC) 10 MG tablet    Sig: Take 1 tablet (10 mg total) by mouth daily.    Dispense:  90 tablet    Refill:  1   atorvastatin (LIPITOR) 20 MG tablet    Sig: Take 1 tablet (20 mg total) by mouth daily.    Dispense:  90 tablet    Refill:  1   carvedilol (COREG) 25 MG tablet    Sig: TAKE 1 TABLET BY MOUTH 2 TIMES DAILY WITH A  MEAL.    Dispense:  180 tablet    Refill:  1   glipiZIDE (GLUCOTROL) 10 MG tablet    Sig: TAKE 1 TABLET BY MOUTH 2 TIMES DAILY BEFORE A MEAL.    Dispense:  180 tablet    Refill:  1   insulin isophane & regular human KwikPen (HUMULIN 70/30 KWIKPEN) (70-30) 100 UNIT/ML KwikPen    Sig: Inject 50 Units into the skin 2 (two) times daily.    Dispense:  120 mL    Refill:  6   losartan (COZAAR) 100 MG tablet    Sig: TAKE  1 TABLET (100 MG TOTAL) BY MOUTH DAILY.    Dispense:  90 tablet    Refill:  1   spironolactone (ALDACTONE) 25 MG tablet    Sig: Take 1 tablet (25 mg total) by mouth daily.    Dispense:  30 tablet    Refill:  0   Dulaglutide (TRULICITY) 1.5 MG/0.5ML SOPN    Sig: Inject 1.5 mg into the skin once a week.    Dispense:  2 mL    Refill:  6    Follow-up: Return in about 6 months (around 06/11/2022) for Chronic medical conditions.       [name], MD, FAAFP. [facility] [place], [place] [phone]   12/11/2021, 5:05 PM

## 2021-12-12 ENCOUNTER — Other Ambulatory Visit: Payer: Self-pay

## 2021-12-12 LAB — BASIC METABOLIC PANEL
BUN/Creatinine Ratio: 14 (ref 9–20)
BUN: 13 mg/dL (ref 6–24)
CO2: 22 mmol/L (ref 20–29)
Calcium: 9.4 mg/dL (ref 8.7–10.2)
Chloride: 108 mmol/L — ABNORMAL HIGH (ref 96–106)
Creatinine, Ser: 0.93 mg/dL (ref 0.76–1.27)
Glucose: 82 mg/dL (ref 70–99)
Potassium: 4.1 mmol/L (ref 3.5–5.2)
Sodium: 147 mmol/L — ABNORMAL HIGH (ref 134–144)
eGFR: 101 mL/min/{1.73_m2} (ref >59)

## 2021-12-13 ENCOUNTER — Other Ambulatory Visit: Payer: Self-pay

## 2021-12-13 ENCOUNTER — Other Ambulatory Visit: Payer: Self-pay | Admitting: Family Medicine

## 2021-12-13 DIAGNOSIS — N528 Other male erectile dysfunction: Secondary | ICD-10-CM

## 2021-12-13 MED ORDER — SILDENAFIL CITRATE 25 MG PO TABS
ORAL_TABLET | ORAL | 2 refills | Status: DC
  Filled 2021-12-13: qty 20, 30d supply, fill #0
  Filled 2022-03-11: qty 20, 30d supply, fill #1
  Filled 2022-07-09: qty 20, 30d supply, fill #2

## 2021-12-16 ENCOUNTER — Other Ambulatory Visit: Payer: Self-pay

## 2021-12-19 ENCOUNTER — Other Ambulatory Visit: Payer: Self-pay

## 2021-12-20 ENCOUNTER — Other Ambulatory Visit: Payer: Self-pay

## 2021-12-20 ENCOUNTER — Other Ambulatory Visit: Payer: Self-pay | Admitting: Pharmacist

## 2021-12-20 MED ORDER — TRUEPLUS 5-BEVEL PEN NEEDLES 32G X 4 MM MISC
2 refills | Status: DC
  Filled 2021-12-20: qty 100, 50d supply, fill #0
  Filled 2022-01-20: qty 100, 25d supply, fill #0
  Filled 2022-02-12 – 2022-07-09 (×4): qty 100, 25d supply, fill #1
  Filled 2022-08-21: qty 100, 25d supply, fill #2

## 2021-12-27 ENCOUNTER — Other Ambulatory Visit: Payer: Self-pay

## 2022-01-20 ENCOUNTER — Other Ambulatory Visit: Payer: Self-pay

## 2022-01-21 ENCOUNTER — Other Ambulatory Visit: Payer: Self-pay

## 2022-02-12 ENCOUNTER — Other Ambulatory Visit: Payer: Self-pay | Admitting: Family Medicine

## 2022-02-12 ENCOUNTER — Other Ambulatory Visit: Payer: Self-pay

## 2022-02-13 ENCOUNTER — Other Ambulatory Visit: Payer: Self-pay

## 2022-02-13 MED ORDER — OMEGA-3-ACID ETHYL ESTERS 1 G PO CAPS
3.0000 g | ORAL_CAPSULE | Freq: Every day | ORAL | 1 refills | Status: DC
  Filled 2022-02-13 – 2022-03-11 (×2): qty 270, 90d supply, fill #0
  Filled 2022-07-09: qty 270, 90d supply, fill #1

## 2022-02-13 NOTE — Telephone Encounter (Signed)
Requested Prescriptions  Pending Prescriptions Disp Refills   omega-3 acid ethyl esters (LOVAZA) 1 g capsule 270 capsule 1    Sig: Take 3 capsules (3 g total) by mouth daily.     Endocrinology:  Nutritional Agents - omega-3 acid ethyl esters Failed - 02/12/2022  5:15 PM      Failed - Lipid Panel in normal range within the last 12 months    Cholesterol, Total  Date Value Ref Range Status  05/14/2021 103 100 - 199 mg/dL Final   LDL Chol Calc (NIH)  Date Value Ref Range Status  05/14/2021 52 0 - 99 mg/dL Final   HDL  Date Value Ref Range Status  05/14/2021 30 (L) >39 mg/dL Final   Triglycerides  Date Value Ref Range Status  05/14/2021 116 0 - 149 mg/dL Final         Passed - Valid encounter within last 12 months    Recent Outpatient Visits           2 months ago Type 2 diabetes mellitus with other specified complication, with long-term current use of insulin (HCC)   [facility] [place], [place], MD   9 months ago Type 2 diabetes mellitus with other specified complication, with long-term current use of insulin (HCC)   [facility] [place], [name], MD   1 year ago Screening for colon cancer   [facility] [place], [name], MD   2 years ago Type 2 diabetes mellitus with other specified complication, with long-term current use of insulin (HCC)   [facility] [place], [name], MD   2 years ago Gastroenteritis   [facility] [name], MD       Future Appointments             In 3 months [name], MD [facility]

## 2022-02-19 ENCOUNTER — Other Ambulatory Visit: Payer: Self-pay

## 2022-03-11 ENCOUNTER — Other Ambulatory Visit: Payer: Self-pay

## 2022-03-11 ENCOUNTER — Other Ambulatory Visit: Payer: Self-pay | Admitting: Family Medicine

## 2022-03-11 DIAGNOSIS — I10 Essential (primary) hypertension: Secondary | ICD-10-CM

## 2022-03-11 DIAGNOSIS — I5022 Chronic systolic (congestive) heart failure: Secondary | ICD-10-CM

## 2022-03-11 MED ORDER — SPIRONOLACTONE 25 MG PO TABS
25.0000 mg | ORAL_TABLET | Freq: Every day | ORAL | 0 refills | Status: DC
  Filled 2022-03-11: qty 30, 30d supply, fill #0

## 2022-03-11 NOTE — Telephone Encounter (Signed)
Requested Prescriptions  Pending Prescriptions Disp Refills   spironolactone (ALDACTONE) 25 MG tablet 30 tablet 0    Sig: Take 1 tablet (25 mg total) by mouth daily.     Cardiovascular: Diuretics - Aldosterone Antagonist Failed - 03/11/2022  2:11 PM      Failed - Na in normal range and within 180 days    Sodium  Date Value Ref Range Status  12/11/2021 147 (H) 134 - 144 mmol/L Final         Passed - Cr in normal range and within 180 days    Creat  Date Value Ref Range Status  05/27/2016 0.84 0.60 - 1.35 mg/dL Final   Creatinine, Ser  Date Value Ref Range Status  12/11/2021 0.93 0.76 - 1.27 mg/dL Final   Creatinine, Urine  Date Value Ref Range Status  05/27/2016 371 (H) 20 - 370 mg/dL Final    Comment:    Result repeated and verified. Result confirmed by automatic dilution.          Passed - K in normal range and within 180 days    Potassium  Date Value Ref Range Status  12/11/2021 4.1 3.5 - 5.2 mmol/L Final         Passed - eGFR is 30 or above and within 180 days    GFR, Est African American  Date Value Ref Range Status  05/27/2016 >89 >=60 mL/min Final   GFR calc Af Amer  Date Value Ref Range Status  10/13/2019 116 >59 mL/min/1.73 Final    Comment:    **[facility] currently reports eGFR in compliance with the current**   recommendations of the [employer]. [facility] will   update reporting as new guidelines are published from the NKF-ASN   Task force.    GFR, Est Non African American  Date Value Ref Range Status  05/27/2016 >89 >=60 mL/min Final   GFR calc non Af Amer  Date Value Ref Range Status  10/13/2019 101 >59 mL/min/1.73 Final   eGFR  Date Value Ref Range Status  12/11/2021 101 >59 mL/min/1.73 Final         Passed - Last BP in normal range    BP Readings from Last 1 Encounters:  12/11/21 121/80         Passed - Valid encounter within last 6 months    Recent Outpatient Visits           3 months ago Type 2 diabetes mellitus with  other specified complication, with long-term current use of insulin ([place])   [place], [place], MD   10 months ago Type 2 diabetes mellitus with other specified complication, with long-term current use of insulin ([place])   [facility] [place], Charlane Ferretti, MD   1 year ago Screening for colon cancer   [place], Charlane Ferretti, MD   2 years ago Type 2 diabetes mellitus with other specified complication, with long-term current use of insulin ([place])   [place], Charlane Ferretti, MD   2 years ago [place], [place], MD       Future Appointments             In 3 months Charlott Rakes, MD [place]

## 2022-04-16 ENCOUNTER — Other Ambulatory Visit: Payer: Self-pay

## 2022-04-16 ENCOUNTER — Other Ambulatory Visit: Payer: Self-pay | Admitting: Family Medicine

## 2022-04-16 DIAGNOSIS — I5022 Chronic systolic (congestive) heart failure: Secondary | ICD-10-CM

## 2022-04-16 DIAGNOSIS — I10 Essential (primary) hypertension: Secondary | ICD-10-CM

## 2022-04-16 MED ORDER — SPIRONOLACTONE 25 MG PO TABS
25.0000 mg | ORAL_TABLET | Freq: Every day | ORAL | 1 refills | Status: DC
  Filled 2022-04-16 – 2022-04-23 (×2): qty 30, 30d supply, fill #0
  Filled 2022-05-23 (×3): qty 30, 30d supply, fill #1

## 2022-04-16 NOTE — Telephone Encounter (Signed)
Requested Prescriptions  Pending Prescriptions Disp Refills   spironolactone (ALDACTONE) 25 MG tablet 30 tablet 1    Sig: Take 1 tablet (25 mg total) by mouth daily.     Cardiovascular: Diuretics - Aldosterone Antagonist Failed - 04/16/2022 10:18 AM      Failed - Na in normal range and within 180 days    Sodium  Date Value Ref Range Status  12/11/2021 147 (H) 134 - 144 mmol/L Final         Passed - Cr in normal range and within 180 days    Creat  Date Value Ref Range Status  05/27/2016 0.84 0.60 - 1.35 mg/dL Final   Creatinine, Ser  Date Value Ref Range Status  12/11/2021 0.93 0.76 - 1.27 mg/dL Final   Creatinine, Urine  Date Value Ref Range Status  05/27/2016 371 (H) 20 - 370 mg/dL Final    Comment:    Result repeated and verified. Result confirmed by automatic dilution.          Passed - K in normal range and within 180 days    Potassium  Date Value Ref Range Status  12/11/2021 4.1 3.5 - 5.2 mmol/L Final         Passed - eGFR is 30 or above and within 180 days    GFR, Est African American  Date Value Ref Range Status  05/27/2016 >89 >=60 mL/min Final   GFR calc Af Amer  Date Value Ref Range Status  10/13/2019 116 >59 mL/min/1.73 Final    Comment:    **[facility] currently reports eGFR in compliance with the current**   recommendations of the [employer]. [facility] will   update reporting as new guidelines are published from the NKF-ASN   Task force.    GFR, Est Non African American  Date Value Ref Range Status  05/27/2016 >89 >=60 mL/min Final   GFR calc non Af Amer  Date Value Ref Range Status  10/13/2019 101 >59 mL/min/1.73 Final   eGFR  Date Value Ref Range Status  12/11/2021 101 >59 mL/min/1.73 Final         Passed - Last BP in normal range    BP Readings from Last 1 Encounters:  12/11/21 121/80         Passed - Valid encounter within last 6 months    Recent Outpatient Visits           4 months ago Type 2 diabetes mellitus with  other specified complication, with long-term current use of insulin ([place])   [place], [place], MD   11 months ago Type 2 diabetes mellitus with other specified complication, with long-term current use of insulin ([place])   [facility] [place], Charlane Ferretti, MD   1 year ago Screening for colon cancer   [place], Charlane Ferretti, MD   2 years ago Type 2 diabetes mellitus with other specified complication, with long-term current use of insulin ([place])   [place], Charlane Ferretti, MD   2 years ago [place], [place], MD       Future Appointments             In 1 month Charlott Rakes, MD [place]

## 2022-04-18 ENCOUNTER — Other Ambulatory Visit: Payer: Self-pay

## 2022-04-23 ENCOUNTER — Other Ambulatory Visit: Payer: Self-pay

## 2022-04-24 ENCOUNTER — Other Ambulatory Visit: Payer: Self-pay

## 2022-05-21 ENCOUNTER — Other Ambulatory Visit: Payer: Self-pay

## 2022-05-23 ENCOUNTER — Other Ambulatory Visit: Payer: Self-pay

## 2022-05-28 ENCOUNTER — Other Ambulatory Visit: Payer: Self-pay

## 2022-06-05 ENCOUNTER — Other Ambulatory Visit: Payer: Self-pay

## 2022-06-11 ENCOUNTER — Ambulatory Visit: Payer: Medicaid Other | Admitting: Family Medicine

## 2022-06-26 ENCOUNTER — Other Ambulatory Visit: Payer: Self-pay

## 2022-06-26 ENCOUNTER — Emergency Department (HOSPITAL_BASED_OUTPATIENT_CLINIC_OR_DEPARTMENT_OTHER)
Admission: EM | Admit: 2022-06-26 | Discharge: 2022-06-26 | Disposition: A | Discharge status: Home / Self Care | Payer: Medicaid Other | Attending: Emergency Medicine | Admitting: Emergency Medicine

## 2022-06-26 ENCOUNTER — Emergency Department (HOSPITAL_BASED_OUTPATIENT_CLINIC_OR_DEPARTMENT_OTHER): Payer: Medicaid Other | Admitting: Radiology

## 2022-06-26 ENCOUNTER — Encounter (HOSPITAL_BASED_OUTPATIENT_CLINIC_OR_DEPARTMENT_OTHER): Payer: Self-pay | Admitting: Emergency Medicine

## 2022-06-26 DIAGNOSIS — M25531 Pain in right wrist: Secondary | ICD-10-CM | POA: Diagnosis not present

## 2022-06-26 DIAGNOSIS — Z79899 Other long term (current) drug therapy: Secondary | ICD-10-CM | POA: Insufficient documentation

## 2022-06-26 DIAGNOSIS — Z794 Long term (current) use of insulin: Secondary | ICD-10-CM | POA: Insufficient documentation

## 2022-06-26 DIAGNOSIS — Z7984 Long term (current) use of oral hypoglycemic drugs: Secondary | ICD-10-CM | POA: Diagnosis not present

## 2022-06-26 DIAGNOSIS — E119 Type 2 diabetes mellitus without complications: Secondary | ICD-10-CM | POA: Diagnosis not present

## 2022-06-26 DIAGNOSIS — I509 Heart failure, unspecified: Secondary | ICD-10-CM | POA: Insufficient documentation

## 2022-06-26 DIAGNOSIS — Z7982 Long term (current) use of aspirin: Secondary | ICD-10-CM | POA: Insufficient documentation

## 2022-06-26 DIAGNOSIS — I110 Hypertensive heart disease with heart failure: Secondary | ICD-10-CM | POA: Insufficient documentation

## 2022-06-26 LAB — CBC WITH DIFFERENTIAL/PLATELET
Abs Immature Granulocytes: 0.01 10*3/uL (ref 0.00–0.07)
Basophils Absolute: 0 10*3/uL (ref 0.0–0.1)
Basophils Relative: 0 %
Eosinophils Absolute: 0 10*3/uL (ref 0.0–0.5)
Eosinophils Relative: 0 %
HCT: 40.9 % (ref 39.0–52.0)
Hemoglobin: 14.2 g/dL (ref 13.0–17.0)
Immature Granulocytes: 0 %
Lymphocytes Relative: 22 %
Lymphs Abs: 2.2 10*3/uL (ref 0.7–4.0)
MCH: 29.8 pg (ref 26.0–34.0)
MCHC: 34.7 g/dL (ref 30.0–36.0)
MCV: 85.9 fL (ref 80.0–100.0)
Monocytes Absolute: 0.7 10*3/uL (ref 0.1–1.0)
Monocytes Relative: 8 %
Neutro Abs: 6.7 10*3/uL (ref 1.7–7.7)
Neutrophils Relative %: 70 %
Platelets: 108 10*3/uL — ABNORMAL LOW (ref 150–400)
RBC: 4.76 MIL/uL (ref 4.22–5.81)
RDW: 12.8 % (ref 11.5–15.5)
WBC: 9.7 10*3/uL (ref 4.0–10.5)
nRBC: 0 % (ref 0.0–0.2)

## 2022-06-26 LAB — BASIC METABOLIC PANEL
Anion gap: 14 (ref 5–15)
BUN: 13 mg/dL (ref 6–20)
CO2: 26 mmol/L (ref 22–32)
Calcium: 9.5 mg/dL (ref 8.9–10.3)
Chloride: 102 mmol/L (ref 98–111)
Creatinine, Ser: 1.19 mg/dL (ref 0.61–1.24)
GFR, Estimated: >60 mL/min (ref >60)
Glucose, Bld: 223 mg/dL — ABNORMAL HIGH (ref 70–99)
Potassium: 3.4 mmol/L — ABNORMAL LOW (ref 3.5–5.1)
Sodium: 142 mmol/L (ref 135–145)

## 2022-06-26 LAB — URIC ACID: Uric Acid, Serum: 8.3 mg/dL (ref 3.7–8.6)

## 2022-06-26 MED ORDER — OXYCODONE-ACETAMINOPHEN 5-325 MG PO TABS
1.0000 | ORAL_TABLET | Freq: Once | ORAL | Status: AC
  Administered 2022-06-26: 1 via ORAL
  Filled 2022-06-26: qty 1

## 2022-06-26 MED ORDER — OXYCODONE-ACETAMINOPHEN 5-325 MG PO TABS
1.0000 | ORAL_TABLET | Freq: Four times a day (QID) | ORAL | 0 refills | Status: AC | PRN
  Filled 2022-06-26: qty 10, 3d supply, fill #0

## 2022-06-26 MED ORDER — KETOROLAC TROMETHAMINE 30 MG/ML IJ SOLN
30.0000 mg | Freq: Once | INTRAMUSCULAR | Status: AC
  Administered 2022-06-26: 30 mg via INTRAMUSCULAR
  Filled 2022-06-26: qty 1

## 2022-06-26 MED ORDER — IBUPROFEN 600 MG PO TABS
600.0000 mg | ORAL_TABLET | Freq: Four times a day (QID) | ORAL | 0 refills | Status: AC | PRN
  Filled 2022-06-26: qty 30, 8d supply, fill #0

## 2022-06-26 NOTE — ED Triage Notes (Addendum)
Presents from home for R hand and arm swelling and pain x 3 days.  Denies fever, redness to the area, N/V  H/o DM, CHF  Denies h/o gout but had a similar instance with his ankle, was never seen for that as it spontaneously resolved.

## 2022-06-26 NOTE — ED Provider Notes (Signed)
[facility] Provider Note   CSN: [id number] Arrival date & time: 06/26/22  0006     History  Chief Complaint  Patient presents with   Arm Swelling    Right    Dave Arnold is a 52 y.o. male.  HPI     This is a 52 year old male who presents with 3 days of right wrist pain.  Patient denies any significant trauma although he does state that he remotely hurt his wrist after using monkey bars several weeks ago.  He has noted swelling and pain to the right wrist.  No history of gout although he states that he has had some intermittent swelling and pain in the left ankle that seems to self resolved.  Denies significant alcohol use or products in his diet.  He has not had any fevers.  He states he has been icing and elevating with minimal relief.  He is right-handed.  Home Medications Prior to Admission medications   Medication Sig Start Date End Date Taking? Authorizing Provider  ibuprofen (ADVIL) 600 MG tablet Take 1 tablet (600 mg total) by mouth every 6 (six) hours as needed. 06/26/22  Yes Sharri Loya, Barbette Hair, MD  oxyCODONE-acetaminophen (PERCOCET/ROXICET) 5-325 MG tablet Take 1 tablet by mouth every 6 (six) hours as needed for severe pain. 06/26/22  Yes Adanna Zuckerman, Barbette Hair, MD  amLODipine (NORVASC) 10 MG tablet Take 1 tablet (10 mg total) by mouth daily. 12/11/21   Charlott Rakes, MD  aspirin EC 81 MG tablet Take 81 mg by mouth daily.     [provider]  atorvastatin (LIPITOR) 20 MG tablet Take 1 tablet (20 mg total) by mouth daily. 12/11/21   Charlott Rakes, MD  carbamide peroxide (DEBROX) 6.5 % OTIC solution Place 5 drops into the right ear 2 (two) times daily. 10/07/21   Tacy Learn, PA-C  carvedilol (COREG) 25 MG tablet TAKE 1 TABLET BY MOUTH 2 TIMES DAILY WITH A MEAL. 12/11/21 12/11/22  Charlott Rakes, MD  cetirizine (ALLERGY RELIEF CETIRIZINE) 10 MG tablet Take 1 tablet (10 mg total) by mouth daily. 07/16/21   Charlott Rakes, MD  clotrimazole (LOTRIMIN) 1 % cream Apply 1 application topically 2 (two) times daily. 01/19/18   Charlott Rakes, MD  Continuous Blood Gluc Receiver (FREESTYLE LIBRE 14 DAY READER) DEVI 1 Device by Does not apply route 3 (three) times daily. 03/08/19   Charlott Rakes, MD  Continuous Blood Gluc Sensor (FREESTYLE LIBRE 14 DAY SENSOR) MISC 1 Device by Does not apply route every 14 (fourteen) days. Use to check blood sugar daily. Use one sensor for 14 days. 03/11/19   Charlott Rakes, MD  Dulaglutide (TRULICITY) 1.5 0000000 SOPN Inject 1.5 mg into the skin once a week. 12/11/21   Charlott Rakes, MD  EPINEPHrine (EPIPEN 2-PAK) 0.3 mg/0.3 mL IJ SOAJ injection Inject 0.3 mLs (0.3 mg total) into the muscle once. 12/05/14   Deno Etienne, DO  glipiZIDE (GLUCOTROL) 10 MG tablet TAKE 1 TABLET BY MOUTH 2 TIMES DAILY BEFORE A MEAL. 12/11/21 12/11/22  Charlott Rakes, MD  ibuprofen (ADVIL,MOTRIN) 600 MG tablet Take 1 tablet (600 mg total) by mouth every 6 (six) hours as needed. 07/19/16   Charlann Lange, PA-C  insulin isophane & regular human KwikPen (HUMULIN 70/30 KWIKPEN) (70-30) 100 UNIT/ML KwikPen Inject 50 Units into the skin 2 (two) times daily. 12/11/21   Charlott Rakes, MD  Insulin Pen Needle (TRUEPLUS 5-BEVEL PEN NEEDLES) 32G X 4 MM MISC Use as instructed.  12/20/21   Charlott Rakes, MD  Insulin Syringe-Needle U-100 (BD INSULIN SYRINGE ULTRAFINE) 31G X 15/64" 0.5 ML MISC 1 each by Does not apply route 2 (two) times daily. 03/16/17   Argentina Donovan, PA-C  losartan (COZAAR) 100 MG tablet TAKE 1 TABLET (100 MG TOTAL) BY MOUTH DAILY. 12/11/21 12/11/22  Charlott Rakes, MD  omega-3 acid ethyl esters (LOVAZA) 1 g capsule Take 3 capsules (3 g total) by mouth daily. 02/13/22 06/09/22  Charlott Rakes, MD  sildenafil (VIAGRA) 25 MG tablet TAKE 2 TABLETS (50 MG TOTAL) BY MOUTH DAILY AS NEEDED FOR ERECTILE DYSFUNCTION. AT LEAST 24 HOURS BETWEEN DOSES 12/13/21   Charlott Rakes, MD  spironolactone (ALDACTONE) 25 MG  tablet Take 1 tablet (25 mg total) by mouth daily. 04/16/22   Charlott Rakes, MD      Allergies    Lisinopril    Review of Systems   Review of Systems  Constitutional:  Negative for fever.  Musculoskeletal:        Right wrist pain  All other systems reviewed and are negative.   Physical Exam Updated Vital Signs BP 115/68   Pulse 77   Temp 98.7 F (37.1 C) (Oral)   Resp 18   Wt 122.5 kg   SpO2 95%   BMI 35.62 kg/m  Physical Exam Vitals and nursing note reviewed.  Constitutional:      Appearance: He is well-developed. He is obese. He is not ill-appearing.  HENT:     Head: Normocephalic and atraumatic.  Eyes:     Pupils: Pupils are equal, round, and reactive to light.  Cardiovascular:     Rate and Rhythm: Normal rate and regular rhythm.  Pulmonary:     Effort: Pulmonary effort is normal. No respiratory distress.  Abdominal:     Palpations: Abdomen is soft.     Tenderness: There is no abdominal tenderness.  Musculoskeletal:     Cervical back: Neck supple.     Comments: Focused examination of the right wrist with slight swelling, limited range of motion secondary to pain, no overlying erythema, no warmth attacked  Lymphadenopathy:     Cervical: No cervical adenopathy.  Skin:    General: Skin is warm and dry.  Neurological:     Mental Status: He is alert and oriented to person, place, and time.  Psychiatric:        Mood and Affect: Mood normal.     ED Results / Procedures / Treatments   Labs (all labs ordered are listed, but only abnormal results are displayed) Labs Reviewed  CBC WITH DIFFERENTIAL/PLATELET - Abnormal; Notable for the following components:      Result Value   Platelets 108 (*)    All other components within normal limits  BASIC METABOLIC PANEL - Abnormal; Notable for the following components:   Potassium 3.4 (*)    Glucose, Bld 223 (*)    All other components within normal limits  URIC ACID    EKG None  Radiology DG Wrist Complete  Right  Result Date: 06/26/2022 CLINICAL DATA:  Hand and arm pain and swelling for several days, initial encounter EXAM: RIGHT WRIST - COMPLETE 3+ VIEW COMPARISON:  None Available. FINDINGS: No acute fracture or dislocation is noted. Generalized soft tissue swelling is seen. No foreign body is noted. IMPRESSION: Soft tissue swelling without acute bony abnormality. Electronically Signed   By: Inez Catalina M.D.   On: 06/26/2022 01:11    Procedures Procedures    Medications Ordered in ED Medications  oxyCODONE-acetaminophen (PERCOCET/ROXICET) 5-325 MG per tablet 1 tablet (1 tablet Oral Given 06/26/22 0054)  ketorolac (TORADOL) 30 MG/ML injection 30 mg (30 mg Intramuscular Given 06/26/22 0145)    ED Course/ Medical Decision Making/ A&P                             Medical Decision Making Amount and/or Complexity of Data Reviewed Labs: ordered. Radiology: ordered.  Risk Prescription drug management.   This patient presents to the ED for concern of right wrist pain, this involves an extensive number of treatment options, and is a complaint that carries with it a high risk of complications and morbidity.  I considered the following differential and admission for this acute, potentially life threatening condition.  The differential diagnosis includes inflammatory arthritis such as gout, occult injury such as sprain, less likely septic joint  MDM:    This is a 52 year old male who presents with right wrist pain.  Relatively atraumatic although he does report her fairly recent potential injury although symptoms did not occur for several weeks afterwards.  His wrist is slightly swollen but not erythematous or warm.  He has no fevers.  No systemic symptoms.  Would have low suspicion for septic joint based on clinical exam.  Given intermittent and recurrent left ankle swelling and pain, question inflammatory arthritis such as gout.  Labs obtained.  No leukocytosis.  Normal renal function.  Does have a  history of diabetes.  Uric acid levels today are negative although this does not fully exclude the potential for gout.  We discussed the potential for arthrocentesis.  Given that I have low suspicion for septic joint, feel it is reasonable to forego and treat presumptively for inflammatory arthritis.  Patient was provided with a splint for comfort.  Will treat with anti-inflammatories and short course of pain medication.  (Labs, imaging, consults)  Labs: I Ordered, and personally interpreted labs.  The pertinent results include: CBC, BMP, uric acid levels  Imaging Studies ordered: I ordered imaging studies including x-ray without obvious fracture or abnormality I independently visualized and interpreted imaging. I agree with the radiologist interpretation  Additional history obtained from wife at bedside.  External records from outside source obtained and reviewed including prior evaluations  Cardiac Monitoring: The patient was not maintained on a cardiac monitor.  If on the cardiac monitor, I personally viewed and interpreted the cardiac monitored which showed an underlying rhythm of: Sinus rhythm  Reevaluation: After the interventions noted above, I reevaluated the patient and found that they have :improved  Social Determinants of Health:  lives independently  Disposition: Discharge  Co morbidities that complicate the patient evaluation  Past Medical History:  Diagnosis Date   Diabetes mellitus without complication ([place])    Heart failure of unknown type ([place]) 06/2011   Hypertension    Obesity    Thrombocytopenia ([place])      Medicines Meds ordered this encounter  Medications   oxyCODONE-acetaminophen (PERCOCET/ROXICET) 5-325 MG per tablet 1 tablet   ketorolac (TORADOL) 30 MG/ML injection 30 mg   ibuprofen (ADVIL) 600 MG tablet    Sig: Take 1 tablet (600 mg total) by mouth every 6 (six) hours as needed.    Dispense:  30 tablet    Refill:  0   oxyCODONE-acetaminophen  (PERCOCET/ROXICET) 5-325 MG tablet    Sig: Take 1 tablet by mouth every 6 (six) hours as needed for severe pain.    Dispense:  10 tablet  Refill:  0    I have reviewed the patients home medicines and have made adjustments as needed  Problem List / ED Course: Problem List Items Addressed This Visit   None Visit Diagnoses     Right wrist pain    -  Primary                   Final Clinical Impression(s) / ED Diagnoses Final diagnoses:  Right wrist pain    Rx / DC Orders ED Discharge Orders          Ordered    ibuprofen (ADVIL) 600 MG tablet  Every 6 hours PRN        06/26/22 0150    oxyCODONE-acetaminophen (PERCOCET/ROXICET) 5-325 MG tablet  Every 6 hours PRN        06/26/22 0150              Merryl Hacker, MD 06/26/22 0155

## 2022-06-26 NOTE — Discharge Instructions (Addendum)
You were seen today for wrist pain.  I highly suspect gout or some other inflammatory arthritis.  Ice and elevate for comfort.  Take anti-inflammatory medications.  Avoid alcohol or pork products.

## 2022-07-09 ENCOUNTER — Other Ambulatory Visit: Payer: Self-pay | Admitting: Emergency Medicine

## 2022-07-09 ENCOUNTER — Other Ambulatory Visit: Payer: Self-pay | Admitting: Family Medicine

## 2022-07-09 ENCOUNTER — Other Ambulatory Visit: Payer: Self-pay

## 2022-07-09 DIAGNOSIS — I10 Essential (primary) hypertension: Secondary | ICD-10-CM

## 2022-07-09 DIAGNOSIS — I5022 Chronic systolic (congestive) heart failure: Secondary | ICD-10-CM

## 2022-07-09 MED ORDER — SPIRONOLACTONE 25 MG PO TABS
25.0000 mg | ORAL_TABLET | Freq: Every day | ORAL | 0 refills | Status: DC
  Filled 2022-07-09: qty 30, 30d supply, fill #0

## 2022-07-10 ENCOUNTER — Other Ambulatory Visit: Payer: Self-pay

## 2022-07-11 ENCOUNTER — Other Ambulatory Visit: Payer: Self-pay

## 2022-07-14 ENCOUNTER — Other Ambulatory Visit: Payer: Self-pay

## 2022-07-14 ENCOUNTER — Other Ambulatory Visit: Payer: Self-pay | Admitting: Family Medicine

## 2022-07-14 DIAGNOSIS — E1169 Type 2 diabetes mellitus with other specified complication: Secondary | ICD-10-CM

## 2022-07-15 NOTE — Telephone Encounter (Signed)
Requested Prescriptions  Pending Prescriptions Disp Refills   HUMULIN 70/30 KWIKPEN (70-30) 100 UNIT/ML KwikPen [Pharmacy Med Name: HumuLIN 70/30 KwikPen Subcutaneous Suspension Pen-injector (70-30) 100 UNIT/ML] 120 mL 0    Sig: DIAL 50 UNITS AND INJECT UNDER THE SKIN TWICE DAILY. MAX DAILY DOSE 100 UNITS.     Endocrinology:  Diabetes - Insulins Failed - 07/14/2022  7:23 AM      Failed - HBA1C is between 0 and 7.9 and within 180 days    HbA1c, POC (controlled diabetic range)  Date Value Ref Range Status  12/11/2021 6.7 0.0 - 7.0 % Final         Failed - Valid encounter within last 6 months    Recent Outpatient Visits           7 months ago Type 2 diabetes mellitus with other specified complication, with long-term current use of insulin (HCC)   [place] [facility] [place], Ford Cliff, MD   1 year ago Type 2 diabetes mellitus with other specified complication, with long-term current use of insulin (HCC)   [place] [facility] [name], MD   1 year ago Screening for colon cancer   [place] [facility] [place], [name], MD   2 years ago Type 2 diabetes mellitus with other specified complication, with long-term current use of insulin [facility])   [place] [facility] [place], [name], MD   3 years ago Gastroenteritis   [place] [facility] [name], MD       Future Appointments             In 1 week [name], MD [facility]

## 2022-07-24 ENCOUNTER — Other Ambulatory Visit: Payer: Self-pay

## 2022-07-24 ENCOUNTER — Ambulatory Visit: Payer: Medicaid Other | Attending: Family Medicine | Admitting: Family Medicine

## 2022-07-24 ENCOUNTER — Encounter: Payer: Self-pay | Admitting: Family Medicine

## 2022-07-24 VITALS — BP 115/74 | HR 65 | Ht 73.0 in | Wt 278.6 lb

## 2022-07-24 DIAGNOSIS — Z794 Long term (current) use of insulin: Secondary | ICD-10-CM | POA: Diagnosis not present

## 2022-07-24 DIAGNOSIS — E1169 Type 2 diabetes mellitus with other specified complication: Secondary | ICD-10-CM

## 2022-07-24 DIAGNOSIS — I10 Essential (primary) hypertension: Secondary | ICD-10-CM | POA: Diagnosis not present

## 2022-07-24 DIAGNOSIS — G5601 Carpal tunnel syndrome, right upper limb: Secondary | ICD-10-CM | POA: Diagnosis not present

## 2022-07-24 DIAGNOSIS — Z1211 Encounter for screening for malignant neoplasm of colon: Secondary | ICD-10-CM

## 2022-07-24 DIAGNOSIS — E7800 Pure hypercholesterolemia, unspecified: Secondary | ICD-10-CM

## 2022-07-24 DIAGNOSIS — I5022 Chronic systolic (congestive) heart failure: Secondary | ICD-10-CM

## 2022-07-24 DIAGNOSIS — M62838 Other muscle spasm: Secondary | ICD-10-CM

## 2022-07-24 LAB — POCT GLYCOSYLATED HEMOGLOBIN (HGB A1C): HbA1c, POC (controlled diabetic range): 7.2 % — AB (ref 0.0–7.0)

## 2022-07-24 LAB — GLUCOSE, POCT (MANUAL RESULT ENTRY): POC Glucose: 127 mg/dl — AB (ref 70–99)

## 2022-07-24 MED ORDER — PREDNISONE 20 MG PO TABS
20.0000 mg | ORAL_TABLET | Freq: Every day | ORAL | 0 refills | Status: DC
  Filled 2022-07-24: qty 5, 5d supply, fill #0

## 2022-07-24 MED ORDER — AMLODIPINE BESYLATE 10 MG PO TABS
10.0000 mg | ORAL_TABLET | Freq: Every day | ORAL | 1 refills | Status: DC
  Filled 2022-07-24 – 2022-08-21 (×2): qty 90, 90d supply, fill #0
  Filled 2022-12-19: qty 90, 90d supply, fill #1

## 2022-07-24 MED ORDER — LOSARTAN POTASSIUM 100 MG PO TABS
100.0000 mg | ORAL_TABLET | Freq: Every day | ORAL | 1 refills | Status: DC
  Filled 2022-07-24: qty 90, fill #0
  Filled 2022-08-21: qty 90, 90d supply, fill #0
  Filled 2022-12-19: qty 90, 90d supply, fill #1

## 2022-07-24 MED ORDER — SPIRONOLACTONE 25 MG PO TABS
25.0000 mg | ORAL_TABLET | Freq: Every day | ORAL | 1 refills | Status: DC
  Filled 2022-07-24 – 2022-08-21 (×2): qty 90, 90d supply, fill #0
  Filled 2022-10-02 – 2022-12-19 (×2): qty 90, 90d supply, fill #1

## 2022-07-24 MED ORDER — CARVEDILOL 25 MG PO TABS
25.0000 mg | ORAL_TABLET | Freq: Two times a day (BID) | ORAL | 1 refills | Status: DC
  Filled 2022-07-24 – 2022-12-19 (×2): qty 180, 90d supply, fill #0

## 2022-07-24 MED ORDER — GLIPIZIDE 10 MG PO TABS
10.0000 mg | ORAL_TABLET | Freq: Two times a day (BID) | ORAL | 1 refills | Status: DC
  Filled 2022-07-24: qty 180, fill #0
  Filled 2022-08-21: qty 180, 90d supply, fill #0
  Filled 2022-12-19: qty 180, 90d supply, fill #1

## 2022-07-24 MED ORDER — ATORVASTATIN CALCIUM 20 MG PO TABS
20.0000 mg | ORAL_TABLET | Freq: Every day | ORAL | 1 refills | Status: DC
  Filled 2022-07-24 – 2022-08-21 (×2): qty 90, 90d supply, fill #0
  Filled 2022-12-19: qty 90, 90d supply, fill #1

## 2022-07-24 MED ORDER — GABAPENTIN 300 MG PO CAPS
300.0000 mg | ORAL_CAPSULE | Freq: Every day | ORAL | 3 refills | Status: DC
  Filled 2022-07-24: qty 30, 30d supply, fill #0
  Filled 2022-08-21: qty 30, 30d supply, fill #1
  Filled 2023-07-24: qty 30, 30d supply, fill #2

## 2022-07-24 MED ORDER — HUMULIN 70/30 KWIKPEN (70-30) 100 UNIT/ML ~~LOC~~ SUPN
50.0000 [IU] | PEN_INJECTOR | Freq: Two times a day (BID) | SUBCUTANEOUS | 1 refills | Status: DC
  Filled 2022-07-24: qty 120, fill #0
  Filled 2022-08-21: qty 90, 90d supply, fill #0
  Filled 2022-10-02: qty 120, 120d supply, fill #0

## 2022-07-24 NOTE — Patient Instructions (Signed)

## 2022-07-24 NOTE — Progress Notes (Signed)
Subjective:  Patient ID: Dave Arnold, male    DOB: 1970-07-19  Age: 52 y.o. MRN: [id number]  CC: Diabetes   HPI SKYLAR Arnold is a 52 y.o. year old male with a history of type 2 diabetes mellitus (A1c 7.2), hypertension, hyperlipidemia, CHF (EF 50-55% from 06/2013), obesity who is seen for a follow-up visit.    Interval History: A1c is 7.2 up from 6.7 previously.  He endorses adherence with his diabetic medication.  He has no hypoglycemia or neuropathy and has no visual concerns.  Does not exercise as much as he should.  1-1.5 months ago he thought he got bitten by an insect. Shortly after he had throbbing and had difficulty picking up his right arm.  He had an ED visit for this and gout was ruled out.  Opioid analgesics were prescribed for him.  Wrist x-ray revealed soft tissue swelling without acute bony abnormality. Pain is mostly in his right wrist on extensor and flexor aspect and has no pain in other joints. [place] and uses his right hand a lot. He is right hand dominant. At night he has to elevate his right hand due to tingling. He is requesting referral for PT for his lower back muscle spasms which she has intermittently Past Medical History:  Diagnosis Date   Diabetes mellitus without complication (HCC)    Heart failure of unknown type (HCC) 06/2011   Hypertension    Obesity    Thrombocytopenia (HCC)     History reviewed. No pertinent surgical history.  Family History  Problem Relation Age of Onset   Heart disease Mother     Social History   Socioeconomic History   Marital status: Single    Spouse name: Not on file   Number of children: Not on file   Years of education: Not on file   Highest education level: 9th grade  Occupational History   Not on file  Tobacco Use   Smoking status: Never   Smokeless tobacco: Never  Vaping Use   Vaping Use: Never used  Substance and Sexual Activity   Alcohol use: No   Drug use: No   Sexual  activity: Not on file  Other Topics Concern   Not on file  Social History Narrative   He is married and has two daughters, was in prison years ago but released in 2008. Denies any smoking, drinking, or drugs.    Social Determinants of Health   Financial Resource Strain: High Risk (07/20/2022)   Overall Financial Resource Strain (CARDIA)    Difficulty of Paying Living Expenses: Very hard  Food Insecurity: Food Insecurity Present (07/20/2022)   Hunger Vital Sign    Worried About Running Out of Food in the Last Year: Sometimes true    Ran Out of Food in the Last Year: Sometimes true  Transportation Needs: No Transportation Needs (07/20/2022)   PRAPARE - Administrator, Civil Service (Medical): No    Lack of Transportation (Non-Medical): No  Physical Activity: Sufficiently Active (07/20/2022)   Exercise Vital Sign    Days of Exercise per Week: 4 days    Minutes of Exercise per Session: 60 min  Stress: Stress Concern Present (07/20/2022)   [employer] of Occupational Health - Occupational Stress Questionnaire    Feeling of Stress : Very much  Social Connections: Socially Isolated (07/20/2022)   Social Connection and Isolation Panel [NHANES]    Frequency of Communication with Friends and Family:  More than three times a week    Frequency of Social Gatherings with Friends and Family: More than three times a week    Attends Religious Services: Never    Database administrator or Organizations: No    Attends Engineer, structural: Not on file    Marital Status: Never married    Allergies  Allergen Reactions   Lisinopril     cough    Outpatient Medications Prior to Visit  Medication Sig Dispense Refill   Continuous Blood Gluc Receiver (FREESTYLE LIBRE 14 DAY READER) DEVI 1 Device by Does not apply route 3 (three) times daily. 1 each 0   Continuous Blood Gluc Sensor (FREESTYLE LIBRE 14 DAY SENSOR) MISC 1 Device by Does not apply route every 14 (fourteen) days. Use  to check blood sugar daily. Use one sensor for 14 days. 2 each 6   Dulaglutide (TRULICITY) 1.5 MG/0.5ML SOPN Inject 1.5 mg into the skin once a week. 2 mL 6   EPINEPHrine (EPIPEN 2-PAK) 0.3 mg/0.3 mL IJ SOAJ injection Inject 0.3 mLs (0.3 mg total) into the muscle once. 1 Device 0   ibuprofen (ADVIL) 600 MG tablet Take 1 tablet (600 mg total) by mouth every 6 (six) hours as needed. 30 tablet 0   Insulin Pen Needle (TRUEPLUS 5-BEVEL PEN NEEDLES) 32G X 4 MM MISC Use as instructed. 100 each 2   Insulin Syringe-Needle U-100 (BD INSULIN SYRINGE ULTRAFINE) 31G X 15/64" 0.5 ML MISC 1 each by Does not apply route 2 (two) times daily. 100 each 5   omega-3 acid ethyl esters (LOVAZA) 1 g capsule Take 3 capsules (3 g total) by mouth daily. 270 capsule 1   oxyCODONE-acetaminophen (PERCOCET/ROXICET) 5-325 MG tablet Take 1 tablet by mouth every 6 (six) hours as needed for severe pain. 10 tablet 0   sildenafil (VIAGRA) 25 MG tablet TAKE 2 TABLETS (50 MG TOTAL) BY MOUTH DAILY AS NEEDED FOR ERECTILE DYSFUNCTION. AT LEAST 24 HOURS BETWEEN DOSES 20 tablet 2   amLODipine (NORVASC) 10 MG tablet Take 1 tablet (10 mg total) by mouth daily. 90 tablet 1   atorvastatin (LIPITOR) 20 MG tablet Take 1 tablet (20 mg total) by mouth daily. 90 tablet 1   carvedilol (COREG) 25 MG tablet Take 1 tablet (25 mg total) by mouth 2 (two) times daily with a meal. 180 tablet 1   glipiZIDE (GLUCOTROL) 10 MG tablet TAKE 1 TABLET BY MOUTH 2 TIMES DAILY BEFORE A MEAL. 180 tablet 1   insulin isophane & regular human KwikPen (HUMULIN 70/30 KWIKPEN) (70-30) 100 UNIT/ML KwikPen DIAL 50 UNITS AND INJECT UNDER THE SKIN TWICE DAILY. MAX DAILY DOSE 100 UNITS. 120 mL 0   losartan (COZAAR) 100 MG tablet TAKE 1 TABLET (100 MG TOTAL) BY MOUTH DAILY. 90 tablet 1   spironolactone (ALDACTONE) 25 MG tablet Take 1 tablet (25 mg total) by mouth daily. 30 tablet 0   aspirin EC 81 MG tablet Take 81 mg by mouth daily.      carbamide peroxide (DEBROX) 6.5 % OTIC  solution Place 5 drops into the right ear 2 (two) times daily. 15 mL 0   cetirizine (ALLERGY RELIEF CETIRIZINE) 10 MG tablet Take 1 tablet (10 mg total) by mouth daily. 30 tablet 2   clotrimazole (LOTRIMIN) 1 % cream Apply 1 application topically 2 (two) times daily. 30 g 0   ibuprofen (ADVIL,MOTRIN) 600 MG tablet Take 1 tablet (600 mg total) by mouth every 6 (six) hours as needed. 30 tablet  0   No facility-administered medications prior to visit.     ROS Review of Systems  Constitutional:  Negative for activity change and appetite change.  HENT:  Negative for sinus pressure and sore throat.   Respiratory:  Negative for chest tightness, shortness of breath and wheezing.   Cardiovascular:  Negative for chest pain and palpitations.  Gastrointestinal:  Negative for abdominal distention, abdominal pain and constipation.  Genitourinary: Negative.   Musculoskeletal:        See HPI  Psychiatric/Behavioral:  Negative for behavioral problems and dysphoric mood.     Objective:  BP 115/74   Pulse 65   Ht 6\' 1"  (1.854 m)   Wt 278 lb 9.6 oz (126.4 kg)   SpO2 97%   BMI 36.76 kg/m      07/24/2022    2:17 PM 06/26/2022    1:45 AM 06/26/2022   12:45 AM  BP/Weight  Systolic BP 115 130 115  Diastolic BP 74 64 68  Wt. (Lbs) 278.6    BMI 36.76 kg/m2        Physical Exam Constitutional:      Appearance: He is well-developed.  Cardiovascular:     Rate and Rhythm: Normal rate.     Heart sounds: Normal heart sounds. No murmur heard. Pulmonary:     Effort: Pulmonary effort is normal.     Breath sounds: Normal breath sounds. No wheezing or rales.  Chest:     Chest wall: No tenderness.  Abdominal:     General: Bowel sounds are normal. There is no distension.     Palpations: Abdomen is soft. There is no mass.     Tenderness: There is no abdominal tenderness.  Musculoskeletal:        General: Swelling (R wrist) present. Normal range of motion.     Right lower leg: No edema.     Left  lower leg: No edema.     Comments: Positive Tinel and Phalen sign in right wrist  Skin:    Comments: Scab on right forearm with slight erythema but no abscess or drainage  Neurological:     Mental Status: He is alert and oriented to person, place, and time.  Psychiatric:        Mood and Affect: Mood normal.        Latest Ref Rng & Units 06/26/2022   12:55 AM 12/11/2021    4:30 PM 05/14/2021   11:50 AM  CMP  Glucose 70 - 99 mg/dL 161  82  096   BUN 6 - 20 mg/dL 13  13  15    Creatinine 0.61 - 1.24 mg/dL 0.45  4.09  8.11   Sodium 135 - 145 mmol/L 142  147  145   Potassium 3.5 - 5.1 mmol/L 3.4  4.1  3.8   Chloride 98 - 111 mmol/L 102  108  104   CO2 22 - 32 mmol/L 26  22  27    Calcium 8.9 - 10.3 mg/dL 9.5  9.4  9.6   Total Protein 6.0 - 8.5 g/dL   7.5   Total Bilirubin 0.0 - 1.2 mg/dL   0.9   Alkaline Phos 44 - 121 IU/L   94   AST 0 - 40 IU/L   21   ALT 0 - 44 IU/L   20     Lipid Panel     Component Value Date/Time   CHOL 103 05/14/2021 1150   TRIG 116 05/14/2021 1150   HDL 30 (L) 05/14/2021  1150   CHOLHDL 5.1 (H) 09/05/2020 1008   CHOLHDL 5.6 (H) 05/27/2016 1024   VLDL 46 (H) 04/26/2015 1054   LDLCALC 52 05/14/2021 1150    CBC    Component Value Date/Time   WBC 9.7 06/26/2022 0055   RBC 4.76 06/26/2022 0055   HGB 14.2 06/26/2022 0055   HCT 40.9 06/26/2022 0055   PLT 108 (L) 06/26/2022 0055   MCV 85.9 06/26/2022 0055   MCH 29.8 06/26/2022 0055   MCHC 34.7 06/26/2022 0055   RDW 12.8 06/26/2022 0055   LYMPHSABS 2.2 06/26/2022 0055   MONOABS 0.7 06/26/2022 0055   EOSABS 0.0 06/26/2022 0055   BASOSABS 0.0 06/26/2022 0055    Lab Results  Component Value Date   HGBA1C 7.2 (A) 07/24/2022    Assessment & Plan:  1. Type 2 diabetes mellitus with other specified complication, with long-term current use of insulin Controlled with A1c of 7.2 Continue current regimen Counseled on Diabetic diet, my plate method, 409 minutes of moderate intensity exercise/week Blood  sugar logs with fasting goals of 80-120 mg/dl, random of less than 811 and in the event of sugars less than 60 mg/dl or greater than 914 mg/dl encouraged to notify the clinic. Advised on the need for annual eye exams, annual foot exams, Pneumonia vaccine. - POCT glucose (manual entry) - POCT glycosylated hemoglobin (Hb A1C) - Microalbumin/Creatinine Ratio, Urine - Ambulatory referral to Ophthalmology - glipiZIDE (GLUCOTROL) 10 MG tablet; TAKE 1 TABLET BY MOUTH 2 TIMES DAILY BEFORE A MEAL.  Dispense: 180 tablet; Refill: 1 - insulin isophane & regular human KwikPen (HUMULIN 70/30 KWIKPEN) (70-30) 100 UNIT/ML KwikPen; DIAL 50 UNITS AND INJECT UNDER THE SKIN TWICE DAILY. MAX DAILY DOSE 100 UNITS.  Dispense: 120 mL; Refill: 1  2. Carpal tunnel syndrome of right wrist Uncontrolled Advised to use wrist splints Will place on short course of prednisone If symptoms persist consider referral for cortisone injection - gabapentin (NEURONTIN) 300 MG capsule; Take 1 capsule (300 mg total) by mouth at bedtime.  Dispense: 30 capsule; Refill: 3  3. Essential hypertension Controlled Continue current regimen Counseled on blood pressure goal of less than 130/80, low-sodium, DASH diet, medication compliance, 150 minutes of moderate intensity exercise per week. Discussed medication compliance, adverse effects. - amLODipine (NORVASC) 10 MG tablet; Take 1 tablet (10 mg total) by mouth daily.  Dispense: 90 tablet; Refill: 1 - carvedilol (COREG) 25 MG tablet; Take 1 tablet (25 mg total) by mouth 2 (two) times daily with a meal.  Dispense: 180 tablet; Refill: 1 - losartan (COZAAR) 100 MG tablet; TAKE 1 TABLET (100 MG TOTAL) BY MOUTH DAILY.  Dispense: 90 tablet; Refill: 1 - spironolactone (ALDACTONE) 25 MG tablet; Take 1 tablet (25 mg total) by mouth daily.  Dispense: 90 tablet; Refill: 1  4. Pure hypercholesterolemia Controlled Low-cholesterol diet - atorvastatin (LIPITOR) 20 MG tablet; Take 1 tablet (20 mg total)  by mouth daily.  Dispense: 90 tablet; Refill: 1  5. Chronic systolic congestive heart failure Euvolemic with EF of 50 to 55% from 2015 He is due for an echocardiogram Will order at next visit - spironolactone (ALDACTONE) 25 MG tablet; Take 1 tablet (25 mg total) by mouth daily.  Dispense: 90 tablet; Refill: 1  6. Muscle spasm He had previously complained of lower back muscle spasm and had been referred to PT but could not go due to lack of insurance He has Medicaid now and would like to be referred - Ambulatory referral to Physical Therapy  7.  Screening for colon cancer - Ambulatory referral to Gastroenterology    Meds ordered this encounter  Medications   gabapentin (NEURONTIN) 300 MG capsule    Sig: Take 1 capsule (300 mg total) by mouth at bedtime.    Dispense:  30 capsule    Refill:  3   amLODipine (NORVASC) 10 MG tablet    Sig: Take 1 tablet (10 mg total) by mouth daily.    Dispense:  90 tablet    Refill:  1   atorvastatin (LIPITOR) 20 MG tablet    Sig: Take 1 tablet (20 mg total) by mouth daily.    Dispense:  90 tablet    Refill:  1   carvedilol (COREG) 25 MG tablet    Sig: Take 1 tablet (25 mg total) by mouth 2 (two) times daily with a meal.    Dispense:  180 tablet    Refill:  1   glipiZIDE (GLUCOTROL) 10 MG tablet    Sig: Take 1 tablet (10 mg total) by mouth 2 (two) times daily before a meal.    Dispense:  180 tablet    Refill:  1   insulin isophane & regular human KwikPen (HUMULIN 70/30 KWIKPEN) (70-30) 100 UNIT/ML KwikPen    Sig: Dial and Inject 50 Units into the skin 2 (two) times daily.Max 100 units daily.    Dispense:  120 mL    Refill:  1   losartan (COZAAR) 100 MG tablet    Sig: Take 1 tablet (100 mg total) by mouth daily.    Dispense:  90 tablet    Refill:  1   spironolactone (ALDACTONE) 25 MG tablet    Sig: Take 1 tablet (25 mg total) by mouth daily.    Dispense:  90 tablet    Refill:  1   predniSONE (DELTASONE) 20 MG tablet    Sig: Take 1 tablet  (20 mg total) by mouth daily with breakfast.    Dispense:  5 tablet    Refill:  0    Follow-up: No follow-ups on file.       [name], MD, FAAFP. [facility] [place], [place] [phone]   07/24/2022, 5:56 PM

## 2022-07-24 NOTE — Progress Notes (Signed)
Pain in right wrist

## 2022-07-25 ENCOUNTER — Other Ambulatory Visit: Payer: Self-pay

## 2022-07-25 LAB — MICROALBUMIN / CREATININE URINE RATIO
Creatinine, Urine: 253.5 mg/dL
Microalb/Creat Ratio: 23 mg/g creat (ref 0–29)
Microalbumin, Urine: 59.1 ug/mL

## 2022-07-31 ENCOUNTER — Other Ambulatory Visit: Payer: Self-pay

## 2022-08-05 NOTE — Therapy (Addendum)
OUTPATIENT PHYSICAL THERAPY THORACOLUMBAR EVALUATION/DC SUMMARY   Patient Name: Dave Arnold MRN: [id number] DOB:November 18, 1970, 52 y.o., male Today's Date: 08/05/2022  PHYSICAL THERAPY DISCHARGE SUMMARY  Visits from Start of Care: 1  Current functional level related to goals / functional outcomes: UTA   Remaining deficits: UTA   Education / Equipment: HEP   Patient agrees to discharge. Patient goals were not met. Patient is being discharged due to not returning since the last visit.    Past Medical History:  Diagnosis Date   Diabetes mellitus without complication (HCC)    Heart failure of unknown type (HCC) 06/2011   Hypertension    Obesity    Thrombocytopenia (HCC)    No past surgical history on file. 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  ONSET DATE: chronic  SUBJECTIVE:                                                                                                                                                                                           SUBJECTIVE STATEMENT: Reports chronic low back pain ongoing for several years, denies radiating symptoms.  Symptoms sporadic in nature but unable to describe aggravating or relieving factors.  He had previously complained of lower back muscle spasm and had been referred to PT but could not go due to lack of insurance He has Medicaid now and would like to be referred - Ambulatory referral to Physical Therapy  PERTINENT HISTORY:  1-1.5 months ago he thought he got bitten by an insect. Shortly  after he had throbbing and had difficulty picking up his right arm.  He had an ED visit for this and gout was ruled out.  Opioid analgesics were prescribed for him.  Wrist x-ray revealed soft tissue swelling without acute bony abnormality. Pain is mostly in his right wrist on extensor and flexor aspect and has no pain in other joints. [place] and uses his right hand a lot. He is right hand dominant. At night he has to elevate his right hand due to tingling. He is requesting referral for PT for his lower back muscle spasms which she has intermittently  PAIN:  Are you having pain?  Yes: NPRS scale: 10/10 Pain location: low back Pain description: sharp, shock, spasm Aggravating factors: undetermined Relieving factors: undetermined  rest  PRECAUTIONS: None  WEIGHT BEARING RESTRICTIONS: No  FALLS:  Has patient fallen in last 6 months? No  OCCUPATION: not working  PLOF: Independent  PATIENT GOALS: To reduce and manage my low back symptoms  NEXT MD VISIT: as needed  OBJECTIVE:   DIAGNOSTIC FINDINGS:  none  PATIENT SURVEYS:  FOTO 50(59 predicted)  SCREENING FOR RED FLAGS: negative  MUSCLE LENGTH: Hamstrings: Right 40d deg; Left 40d deg   POSTURE: rounded shoulders, forward head, and decreased lumbar lordosis   LUMBAR ROM:   AROM eval  Flexion 50%  Extension 10%  Right lateral flexion 50%  Left lateral flexion 50%  Right rotation 50%  Left rotation 50%   (Blank rows = not tested)  LOWER EXTREMITY ROM:     Passive  Right eval Left eval  Hip flexion 90d 90d  Hip extension    Hip abduction    Hip adduction    Hip internal rotation 10d 10d  Hip external rotation    Knee flexion    Knee extension    Ankle dorsiflexion    Ankle plantarflexion    Ankle inversion    Ankle eversion     (Blank rows = not tested)  LOWER EXTREMITY MMT:    MMT Right eval Left eval  Hip flexion 4 4  Hip extension 4 4  Hip abduction 4 4  Hip adduction     Hip internal rotation    Hip external rotation    Knee flexion 4 4  Knee extension 4 4  Ankle dorsiflexion    Ankle plantarflexion 4 4  Ankle inversion    Ankle eversion     (Blank rows = not tested)  LUMBAR SPECIAL TESTS:  Straight leg raise test: inconclusive and Slump test: Positive B (possible myofascial pain)  FUNCTIONAL TESTS:  5 times sit to stand: 24s arms crossed  GAIT: Distance walked: 54ft x2 Assistive device utilized: None Level of assistance: Complete Independence Comments: flexed posture with rounded shoulders  TODAY'S TREATMENT:                                                                                                                              DATE: 08/06/22 Eval     PATIENT EDUCATION:  Education details: Discussed eval findings, rehab rationale and POC and patient is in agreement  Person educated: Patient Education method: Explanation Education comprehension: verbalized understanding and needs further education  HOME EXERCISE PROGRAM: Access Code: [email or web address] URL: [email or web address] Date: 08/06/2022 Prepared by: Gustavus Bryant  Exercises - Static Prone on Elbows  - 2 x daily - 5 x weekly - 1 sets - 1 reps - 2 min hold - Seated Hamstring Stretch  - 2 x daily - 5 x weekly - 2 sets - 2 reps - 30s hold - Sit to Stand with Arms Crossed  -  2 x daily - 5 x weekly - 1 sets - 5 reps  ASSESSMENT:  CLINICAL IMPRESSION: Patient is a 52 y.o. male who was seen today for physical therapy evaluation and treatment for chronic low back pain. Patient presents with marked AROM limitations all planes of spinal motion.  Strength deficits observed in LE's and trunk musculature as evidenced by difficulty with position changes.  No distinct neuro tension signs elicited however myofascial restrictions were prevalent throughout.  Patient is a good candidate for OPPT with the goal of increasing AROM and minimizing soft tissue restrictions  throughout.  OBJECTIVE IMPAIRMENTS: decreased activity tolerance, decreased knowledge of condition, decreased mobility, decreased ROM, increased muscle spasms, impaired flexibility, improper body mechanics, postural dysfunction, obesity, and pain.   ACTIVITY LIMITATIONS: sitting, sleeping, and bed mobility  PERSONAL FACTORS: Age, Fitness, Time since onset of injury/illness/exacerbation, and 1-2 comorbidities: DM and obesity  are also affecting patient's functional outcome.   REHAB POTENTIAL: Good  CLINICAL DECISION MAKING: Stable/uncomplicated  EVALUATION COMPLEXITY: Low   GOALS: Goals reviewed with patient? No  SHORT TERM GOALS=LONG TERM GOALS: Target date: 09/17/2022    Patient to demonstrate independence in HEP  Baseline: [id number] Goal status: INITIAL  2.  Decrease worst pain to 6/10 Baseline: 10/10 worst pain Goal status: INITIAL  3.  Improve AROM by 25% all planes Baseline:  AROM eval  Flexion 50%  Extension 10%  Right lateral flexion 50%  Left lateral flexion 50%  Right rotation 50%  Left rotation 50%   (Blank rows = not tested) Goal status: INITIAL  4.  Increase FOTO score to 59 Baseline: 50 Goal status: INITIAL     PLAN:  PT FREQUENCY: 1x/week  PT DURATION: 6 weeks   PLANNED INTERVENTIONS: Therapeutic exercises, Therapeutic activity, Neuromuscular re-education, Balance training, Gait training, Patient/Family education, Self Care, Joint mobilization, and Re-evaluation.  PLAN FOR NEXT SESSION: HEP review and update, manual techniques as appropriate, aerobic tasks, ROM and flexibility activities, strengthening and PREs, TPDN, gait and balance training as needed     Hildred Laser, PT 08/05/2022, 9:59 AM   Check all possible CPT codes: 16109 - PT Re-evaluation, 97110- Therapeutic Exercise, [id number]- Neuro Re-education, [id number] - Gait Training, [phone] - Manual Therapy, [phone] - Therapeutic Activities, and [phone] - Self Care    Check all conditions that are  expected to impact treatment: {Conditions expected to impact treatment:Morbid obesity and Diabetes mellitus   If treatment provided at initial evaluation, no treatment charged due to lack of authorization.

## 2022-08-06 ENCOUNTER — Other Ambulatory Visit: Payer: Self-pay

## 2022-08-06 ENCOUNTER — Ambulatory Visit: Payer: Medicaid Other | Attending: Family Medicine

## 2022-08-06 DIAGNOSIS — M5459 Other low back pain: Secondary | ICD-10-CM | POA: Diagnosis present

## 2022-08-06 DIAGNOSIS — M62830 Muscle spasm of back: Secondary | ICD-10-CM | POA: Diagnosis present

## 2022-08-06 DIAGNOSIS — M6281 Muscle weakness (generalized): Secondary | ICD-10-CM

## 2022-08-06 DIAGNOSIS — M62838 Other muscle spasm: Secondary | ICD-10-CM | POA: Insufficient documentation

## 2022-08-20 ENCOUNTER — Ambulatory Visit: Payer: Medicaid Other

## 2022-08-21 ENCOUNTER — Other Ambulatory Visit: Payer: Self-pay

## 2022-08-26 NOTE — Therapy (Deleted)
OUTPATIENT PHYSICAL THERAPY TREATMENT NOTE   Patient Name: Dave Arnold MRN: [id number] DOB:05-Nov-1970, 52 y.o., male Today's Date: 08/26/2022  PCP: [name], MD  REFERRING PROVIDER: [name], MD   END OF SESSION:    Past Medical History:  Diagnosis Date   Diabetes mellitus without complication (HCC)    Heart failure of unknown type (HCC) 06/2011   Hypertension    Obesity    Thrombocytopenia (HCC)    No past surgical history on file. 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  Rationale for Evaluation and Treatment Rehabilitation  PERTINENT HISTORY: 1-1.5 months ago he thought he got bitten by an insect. Shortly after he had throbbing and had difficulty picking up his right arm.  He had an ED visit for this and gout was ruled out.  Opioid analgesics were prescribed for him.  Wrist x-ray revealed soft tissue swelling without acute bony abnormality. Pain is mostly in his right wrist on extensor and flexor aspect and has no pain in other joints. [place] and [place] his right hand a lot. He is right hand dominant. At night he has to elevate his right hand due to tingling. He is requesting referral for PT for his lower back muscle spasms which she has intermittently  PRECAUTIONS: None   SUBJECTIVE:                                                                                                                                                                                      SUBJECTIVE STATEMENT:  ***   PAIN:  Are you having pain? {OPRCPAIN:[id number]}   OBJECTIVE: (objective measures completed at initial evaluation unless otherwise  dated)   DIAGNOSTIC FINDINGS:  none   PATIENT SURVEYS:  FOTO 50(59 predicted)   SCREENING FOR RED FLAGS: negative   MUSCLE LENGTH: Hamstrings: Right 40d deg; Left 40d deg     POSTURE: rounded shoulders, forward head, and decreased lumbar lordosis     LUMBAR ROM:    AROM eval  Flexion 50%  Extension 10%  Right lateral flexion 50%  Left lateral flexion 50%  Right rotation 50%  Left rotation 50%   (Blank rows = not tested)   LOWER EXTREMITY ROM:      Passive  Right eval Left eval  Hip flexion 90d 90d  Hip extension      Hip abduction      Hip adduction      Hip internal rotation 10d 10d  Hip external rotation      Knee flexion      Knee extension      Ankle dorsiflexion      Ankle plantarflexion      Ankle inversion      Ankle eversion       (Blank rows = not tested)   LOWER EXTREMITY MMT:     MMT Right eval Left eval  Hip flexion 4 4  Hip extension 4 4  Hip abduction 4 4  Hip adduction      Hip internal rotation      Hip external rotation      Knee flexion 4 4  Knee extension 4 4  Ankle dorsiflexion      Ankle plantarflexion 4 4  Ankle inversion      Ankle eversion       (Blank rows = not tested)   LUMBAR SPECIAL TESTS:  Straight leg raise test: inconclusive and Slump test: Positive B (possible myofascial pain)   FUNCTIONAL TESTS:  5 times sit to stand: 24s arms crossed   GAIT: Distance walked: 47ft x2 Assistive device utilized: None Level of assistance: Complete Independence Comments: flexed posture with rounded shoulders   TODAY'S TREATMENT:                                                                                                                              DATE: 08/06/22 Eval       PATIENT EDUCATION:  Education details: Discussed eval findings, rehab rationale and POC and patient is in agreement  Person educated: Patient Education method: Explanation Education comprehension: verbalized understanding and needs further  education   HOME EXERCISE PROGRAM: Access Code: [email or web address] URL: [email or web address] Date: 08/06/2022 Prepared by: Gustavus Bryant   Exercises - Static Prone on Elbows  - 2 x daily - 5 x weekly - 1 sets - 1 reps - 2 min hold - Seated Hamstring Stretch  - 2 x daily - 5 x weekly - 2 sets - 2 reps - 30s hold - Sit to Stand with Arms Crossed  - 2 x daily - 5 x weekly - 1 sets - 5 reps   ASSESSMENT:   CLINICAL IMPRESSION: Patient is a 52 y.o. male who was seen today for physical therapy evaluation and treatment for chronic low back pain. Patient presents with marked AROM limitations all planes of spinal motion.  Strength deficits observed in LE's and trunk musculature as evidenced by difficulty with position changes.  No distinct neuro tension signs elicited however myofascial restrictions were prevalent throughout.  Patient is a good candidate for OPPT with the goal of increasing AROM and minimizing soft tissue restrictions throughout.   OBJECTIVE IMPAIRMENTS: decreased activity tolerance, decreased knowledge of condition, decreased mobility, decreased ROM, increased muscle spasms, impaired flexibility, improper body  mechanics, postural dysfunction, obesity, and pain.    ACTIVITY LIMITATIONS: sitting, sleeping, and bed mobility   PERSONAL FACTORS: Age, Fitness, Time since onset of injury/illness/exacerbation, and 1-2 comorbidities: DM and obesity  are also affecting patient's functional outcome.    REHAB POTENTIAL: Good   CLINICAL DECISION MAKING: Stable/uncomplicated   EVALUATION COMPLEXITY: Low     GOALS: Goals reviewed with patient? No   SHORT TERM GOALS=LONG TERM GOALS: Target date: 09/17/2022     Patient to demonstrate independence in HEP  Baseline: V5TYQCM3 Goal status: INITIAL   2.  Decrease worst pain to 6/10 Baseline: 10/10 worst pain Goal status: INITIAL   3.  Improve AROM by 25% all planes Baseline:  AROM eval  Flexion 50%  Extension 10%  Right  lateral flexion 50%  Left lateral flexion 50%  Right rotation 50%  Left rotation 50%   (Blank rows = not tested) Goal status: INITIAL   4.  Increase FOTO score to 59 Baseline: 50 Goal status: INITIAL         PLAN:   PT FREQUENCY: 1x/week   PT DURATION: 6 weeks    PLANNED INTERVENTIONS: Therapeutic exercises, Therapeutic activity, Neuromuscular re-education, Balance training, Gait training, Patient/Family education, Self Care, Joint mobilization, and Re-evaluation.   PLAN FOR NEXT SESSION: HEP review and update, manual techniques as appropriate, aerobic tasks, ROM and flexibility activities, strengthening and PREs, TPDN, gait and balance training as needed    Hildred Laser, PT 08/26/2022, 1:19 PM

## 2022-08-27 ENCOUNTER — Ambulatory Visit: Payer: Medicaid Other

## 2022-08-27 ENCOUNTER — Other Ambulatory Visit: Payer: Self-pay

## 2022-08-28 ENCOUNTER — Other Ambulatory Visit: Payer: Self-pay

## 2022-09-01 NOTE — Therapy (Deleted)
OUTPATIENT PHYSICAL THERAPY TREATMENT NOTE   Patient Name: Dave Arnold MRN: [id number] DOB:[birth year], 52 y.o., male Today's Date: 09/01/2022  PCP: [name], MD  REFERRING PROVIDER: [name], MD   END OF SESSION:    Past Medical History:  Diagnosis Date   Diabetes mellitus without complication (HCC)    Heart failure of unknown type (HCC) 06/2011   Hypertension    Obesity    Thrombocytopenia (HCC)    No past surgical history on file. 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  Rationale for Evaluation and Treatment Rehabilitation  PERTINENT HISTORY: 1-1.5 months ago he thought he got bitten by an insect. Shortly after he had throbbing and had difficulty picking up his right arm.  He had an ED visit for this and gout was ruled out.  Opioid analgesics were prescribed for him.  Wrist x-ray revealed soft tissue swelling without acute bony abnormality. Pain is mostly in his right wrist on extensor and flexor aspect and has no pain in other joints. [place] and uses his right hand a lot. He is right hand dominant. At night he has to elevate his right hand due to tingling. He is requesting referral for PT for his lower back muscle spasms which she has intermittently  PRECAUTIONS: None   SUBJECTIVE:                                                                                                                                                                                      SUBJECTIVE STATEMENT:  ***   PAIN:  Are you having pain? {OPRCPAIN:[id number]}   OBJECTIVE: (objective measures completed at initial evaluation unless otherwise  dated)   DIAGNOSTIC FINDINGS:  none   PATIENT SURVEYS:  FOTO 50(59 predicted)   SCREENING FOR RED FLAGS: negative   MUSCLE LENGTH: Hamstrings: Right 40d deg; Left 40d deg     POSTURE: rounded shoulders, forward head, and decreased lumbar lordosis     LUMBAR ROM:    AROM eval  Flexion 50%  Extension 10%  Right lateral flexion 50%  Left lateral flexion 50%  Right rotation 50%  Left rotation 50%   (Blank rows = not tested)   LOWER EXTREMITY ROM:      Passive  Right eval Left eval  Hip flexion 90d 90d  Hip extension      Hip abduction      Hip adduction      Hip internal rotation 10d 10d  Hip external rotation      Knee flexion      Knee extension      Ankle dorsiflexion      Ankle plantarflexion      Ankle inversion      Ankle eversion       (Blank rows = not tested)   LOWER EXTREMITY MMT:     MMT Right eval Left eval  Hip flexion 4 4  Hip extension 4 4  Hip abduction 4 4  Hip adduction      Hip internal rotation      Hip external rotation      Knee flexion 4 4  Knee extension 4 4  Ankle dorsiflexion      Ankle plantarflexion 4 4  Ankle inversion      Ankle eversion       (Blank rows = not tested)   LUMBAR SPECIAL TESTS:  Straight leg raise test: inconclusive and Slump test: Positive B (possible myofascial pain)   FUNCTIONAL TESTS:  5 times sit to stand: 24s arms crossed   GAIT: Distance walked: 79ft x2 Assistive device utilized: None Level of assistance: Complete Independence Comments: flexed posture with rounded shoulders   TODAY'S TREATMENT:                                                                                                                              DATE: 08/06/22 Eval       PATIENT EDUCATION:  Education details: Discussed eval findings, rehab rationale and POC and patient is in agreement  Person educated: Patient Education method: Explanation Education comprehension: verbalized understanding and needs further  education   HOME EXERCISE PROGRAM: Access Code: [email or web address] URL: [email or web address] Date: 08/06/2022 Prepared by: Gustavus Bryant   Exercises - Static Prone on Elbows  - 2 x daily - 5 x weekly - 1 sets - 1 reps - 2 min hold - Seated Hamstring Stretch  - 2 x daily - 5 x weekly - 2 sets - 2 reps - 30s hold - Sit to Stand with Arms Crossed  - 2 x daily - 5 x weekly - 1 sets - 5 reps   ASSESSMENT:   CLINICAL IMPRESSION: Patient is a 52 y.o. male who was seen today for physical therapy evaluation and treatment for chronic low back pain. Patient presents with marked AROM limitations all planes of spinal motion.  Strength deficits observed in LE's and trunk musculature as evidenced by difficulty with position changes.  No distinct neuro tension signs elicited however myofascial restrictions were prevalent throughout.  Patient is a good candidate for OPPT with the goal of increasing AROM and minimizing soft tissue restrictions throughout.   OBJECTIVE IMPAIRMENTS: decreased activity tolerance, decreased knowledge of condition, decreased mobility, decreased ROM, increased muscle spasms, impaired flexibility, improper body  mechanics, postural dysfunction, obesity, and pain.    ACTIVITY LIMITATIONS: sitting, sleeping, and bed mobility   PERSONAL FACTORS: Age, Fitness, Time since onset of injury/illness/exacerbation, and 1-2 comorbidities: DM and obesity  are also affecting patient's functional outcome.    REHAB POTENTIAL: Good   CLINICAL DECISION MAKING: Stable/uncomplicated   EVALUATION COMPLEXITY: Low     GOALS: Goals reviewed with patient? No   SHORT TERM GOALS=LONG TERM GOALS: Target date: 09/17/2022     Patient to demonstrate independence in HEP  Baseline: V5TYQCM3 Goal status: INITIAL   2.  Decrease worst pain to 6/10 Baseline: 10/10 worst pain Goal status: INITIAL   3.  Improve AROM by 25% all planes Baseline:  AROM eval  Flexion 50%  Extension 10%  Right  lateral flexion 50%  Left lateral flexion 50%  Right rotation 50%  Left rotation 50%   (Blank rows = not tested) Goal status: INITIAL   4.  Increase FOTO score to 59 Baseline: 50 Goal status: INITIAL         PLAN:   PT FREQUENCY: 1x/week   PT DURATION: 6 weeks    PLANNED INTERVENTIONS: Therapeutic exercises, Therapeutic activity, Neuromuscular re-education, Balance training, Gait training, Patient/Family education, Self Care, Joint mobilization, and Re-evaluation.   PLAN FOR NEXT SESSION: HEP review and update, manual techniques as appropriate, aerobic tasks, ROM and flexibility activities, strengthening and PREs, TPDN, gait and balance training as needed    Hildred Laser, PT 09/01/2022, 4:02 PM

## 2022-09-03 ENCOUNTER — Ambulatory Visit: Payer: Medicaid Other | Attending: Family Medicine

## 2022-09-03 ENCOUNTER — Telehealth: Payer: Self-pay

## 2022-09-03 NOTE — Telephone Encounter (Signed)
TC due to missed visit.  Informed patient of next scheduled visit date and time as well as need to cancel visits past that date per attendance policy.  Left clinic number to f/u with any questions.

## 2022-09-08 LAB — HM DIABETES EYE EXAM

## 2022-09-10 ENCOUNTER — Ambulatory Visit: Payer: Medicaid Other

## 2022-09-10 NOTE — Therapy (Deleted)
OUTPATIENT PHYSICAL THERAPY THORACOLUMBAR TREATMENT NOTE   Patient Name: Dave Arnold MRN: [id number] DOB:1971/01/01, 52 y.o., male Today's Date: 09/10/2022  END OF SESSION:   Past Medical History:  Diagnosis Date   Diabetes mellitus without complication (HCC)    Heart failure of unknown type (HCC) 06/2011   Hypertension    Obesity    Thrombocytopenia (HCC)    No past surgical history on file. 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  ONSET DATE: chronic  SUBJECTIVE:                                                                                                                                                                                           SUBJECTIVE STATEMENT: ***  Reports chronic low back pain ongoing for several years, denies radiating symptoms.  Symptoms sporadic in nature but unable to describe aggravating or relieving factors.  He had previously complained of lower back muscle spasm and had been referred to PT but could not go due to lack of insurance He has Medicaid now and would like to be referred - Ambulatory referral to Physical Therapy  PERTINENT HISTORY:  1-1.5 months ago he thought he got bitten by an insect. Shortly after he had throbbing and had difficulty picking up his right arm.  He had an ED visit for this and gout was ruled out.  Opioid analgesics were prescribed for him.  Wrist x-ray revealed soft tissue swelling without acute bony abnormality. Pain is mostly in his right wrist on extensor and flexor aspect and has no  pain in other joints. [place] and uses his right hand a lot. He is right hand dominant. At night he has to elevate his right hand due to tingling. He is requesting referral for PT for his lower back muscle spasms which she has intermittently  PAIN:  Are you having pain? Yes: NPRS scale: ***10/10 Pain location: low back Pain description: sharp, shock, spasm Aggravating factors: undetermined Relieving factors: undetermined rest  PRECAUTIONS: None  WEIGHT BEARING RESTRICTIONS: No  FALLS:  Has patient fallen in last 6 months? No  OCCUPATION: not working  PLOF: Independent  PATIENT GOALS: To reduce and  manage my low back symptoms  NEXT MD VISIT: as needed  OBJECTIVE:   DIAGNOSTIC FINDINGS:  none  PATIENT SURVEYS:  FOTO 50(59 predicted)  SCREENING FOR RED FLAGS: negative  MUSCLE LENGTH: Hamstrings: Right 40d deg; Left 40d deg   POSTURE: rounded shoulders, forward head, and decreased lumbar lordosis   LUMBAR ROM:   AROM eval  Flexion 50%  Extension 10%  Right lateral flexion 50%  Left lateral flexion 50%  Right rotation 50%  Left rotation 50%   (Blank rows = not tested)  LOWER EXTREMITY ROM:     Passive  Right eval Left eval  Hip flexion 90d 90d  Hip extension    Hip abduction    Hip adduction    Hip internal rotation 10d 10d  Hip external rotation    Knee flexion    Knee extension    Ankle dorsiflexion    Ankle plantarflexion    Ankle inversion    Ankle eversion     (Blank rows = not tested)  LOWER EXTREMITY MMT:    MMT Right eval Left eval  Hip flexion 4 4  Hip extension 4 4  Hip abduction 4 4  Hip adduction    Hip internal rotation    Hip external rotation    Knee flexion 4 4  Knee extension 4 4  Ankle dorsiflexion    Ankle plantarflexion 4 4  Ankle inversion    Ankle eversion     (Blank rows = not tested)  LUMBAR SPECIAL TESTS:  Straight leg raise test: inconclusive and Slump test: Positive B (possible  myofascial pain)  FUNCTIONAL TESTS:  5 times sit to stand: 24s arms crossed  GAIT: Distance walked: 60ft x2 Assistive device utilized: None Level of assistance: Complete Independence Comments: flexed posture with rounded shoulders  TODAY'S TREATMENT:         [facility] Adult PT Treatment:                                                DATE: 09/10/22 Therapeutic Exercise: Nustep level 5 x 5 mins Standing hip abduction/extension 2x10 each BIL Omega knee flexion 2x10 Omega knee extension 2x10 Seated hamstring stretch 2x30" BIL Bridges 2x10 LTR x10 BIL Sidelying open books x10 BIL Prone on elbows x2' Prone press ups (small ROM) 2x10 Seated pball roll outs fwd/lat x10 each                                                                                                                        DATE: 08/06/22 Eval     PATIENT EDUCATION:  Education details: Discussed eval findings, rehab rationale and POC and patient is in agreement  Person educated: Patient Education method: Explanation Education comprehension: verbalized understanding and needs further education  HOME EXERCISE PROGRAM: Access Code: [email or web address] URL: [email or web address] Date: 08/06/2022 Prepared by: Gustavus Bryant  Exercises - Static Prone on Elbows  - 2 x daily - 5 x weekly - 1 sets - 1 reps - 2 min hold - Seated Hamstring Stretch  - 2 x daily - 5 x weekly - 2 sets - 2 reps - 30s hold - Sit to Stand with Arms Crossed  - 2 x daily - 5 x weekly - 1 sets - 5 reps  ASSESSMENT:  CLINICAL IMPRESSION: ***  Patient is a 52 y.o. male who was seen today for physical therapy evaluation and treatment for chronic low back pain. Patient presents with marked AROM limitations all planes of spinal motion.  Strength deficits observed in LE's and trunk musculature as evidenced by difficulty with position changes.  No distinct neuro tension signs elicited however myofascial restrictions were prevalent throughout.  Patient  is a good candidate for OPPT with the goal of increasing AROM and minimizing soft tissue restrictions throughout.  OBJECTIVE IMPAIRMENTS: decreased activity tolerance, decreased knowledge of condition, decreased mobility, decreased ROM, increased muscle spasms, impaired flexibility, improper body mechanics, postural dysfunction, obesity, and pain.   ACTIVITY LIMITATIONS: sitting, sleeping, and bed mobility  PERSONAL FACTORS: Age, Fitness, Time since onset of injury/illness/exacerbation, and 1-2 comorbidities: DM and obesity  are also affecting patient's functional outcome.   REHAB POTENTIAL: Good  CLINICAL DECISION MAKING: Stable/uncomplicated  EVALUATION COMPLEXITY: Low   GOALS: Goals reviewed with patient? No  SHORT TERM GOALS=LONG TERM GOALS: Target date: 09/17/2022    Patient to demonstrate independence in HEP  Baseline: V5TYQCM3 Goal status: INITIAL  2.  Decrease worst pain to 6/10 Baseline: 10/10 worst pain Goal status: INITIAL  3.  Improve AROM by 25% all planes Baseline:  AROM eval  Flexion 50%  Extension 10%  Right lateral flexion 50%  Left lateral flexion 50%  Right rotation 50%  Left rotation 50%   (Blank rows = not tested) Goal status: INITIAL  4.  Increase FOTO score to 59 Baseline: 50 Goal status: INITIAL   PLAN:  PT FREQUENCY: 1x/week  PT DURATION: 6 weeks   PLANNED INTERVENTIONS: Therapeutic exercises, Therapeutic activity, Neuromuscular re-education, Balance training, Gait training, Patient/Family education, Self Care, Joint mobilization, and Re-evaluation.  PLAN FOR NEXT SESSION: HEP review and update, manual techniques as appropriate, aerobic tasks, ROM and flexibility activities, strengthening and PREs, TPDN, gait and balance training as needed     Berta Minor, PTA 09/10/2022, 10:57 AM

## 2022-09-12 ENCOUNTER — Other Ambulatory Visit: Payer: Self-pay

## 2022-09-17 ENCOUNTER — Ambulatory Visit: Payer: Medicaid Other

## 2022-09-19 ENCOUNTER — Other Ambulatory Visit: Payer: Self-pay

## 2022-09-24 ENCOUNTER — Ambulatory Visit: Payer: Medicaid Other

## 2022-10-02 ENCOUNTER — Other Ambulatory Visit: Payer: Self-pay | Admitting: Family Medicine

## 2022-10-03 ENCOUNTER — Other Ambulatory Visit: Payer: Self-pay

## 2022-10-03 MED ORDER — OMEGA-3-ACID ETHYL ESTERS 1 G PO CAPS
3.0000 g | ORAL_CAPSULE | Freq: Every day | ORAL | 0 refills | Status: DC
  Filled 2022-10-03: qty 270, 90d supply, fill #0

## 2022-10-03 NOTE — Telephone Encounter (Signed)
Requested medication (s) are due for refill today: -  Requested medication (s) are on the active medication list: ended  Last refill:  02/13/22 #270 1 RF  Future visit scheduled: no  Notes to clinic:  med ended 10/07/22    Requested Prescriptions  Pending Prescriptions Disp Refills   omega-3 acid ethyl esters (LOVAZA) 1 g capsule 270 capsule 1    Sig: Take 3 capsules (3 g total) by mouth daily.     Endocrinology:  Nutritional Agents - omega-3 acid ethyl esters Failed - 10/02/2022  1:36 PM      Failed - Lipid Panel in normal range within the last 12 months    Cholesterol, Total  Date Value Ref Range Status  05/14/2021 103 100 - 199 mg/dL Final   LDL Chol Calc (NIH)  Date Value Ref Range Status  05/14/2021 52 0 - 99 mg/dL Final   HDL  Date Value Ref Range Status  05/14/2021 30 (L) >39 mg/dL Final   Triglycerides  Date Value Ref Range Status  05/14/2021 116 0 - 149 mg/dL Final         Passed - Valid encounter within last 12 months    Recent Outpatient Visits           2 months ago Type 2 diabetes mellitus with other specified complication, with long-term current use of insulin (HCC)   [place] [facility] [place], [place], MD   9 months ago Type 2 diabetes mellitus with other specified complication, with long-term current use of insulin (HCC)   [place] [facility] [place], [place], MD   1 year ago Type 2 diabetes mellitus with other specified complication, with long-term current use of insulin [facility])   [place] [facility] [name], MD   2 years ago Screening for colon cancer   [place] [facility] [place], [name], MD   2 years ago Type 2 diabetes mellitus with other specified complication, with long-term current use of insulin [facility])   [place] [facility] [place] [name], MD

## 2022-10-06 ENCOUNTER — Other Ambulatory Visit: Payer: Self-pay

## 2022-10-10 ENCOUNTER — Other Ambulatory Visit: Payer: Self-pay

## 2022-10-28 ENCOUNTER — Other Ambulatory Visit: Payer: Self-pay | Admitting: Family Medicine

## 2022-10-28 DIAGNOSIS — Z794 Long term (current) use of insulin: Secondary | ICD-10-CM

## 2022-10-28 NOTE — Telephone Encounter (Signed)
Requested Prescriptions  Pending Prescriptions Disp Refills   insulin isophane & regular human KwikPen (HUMULIN 70/30 KWIKPEN) (70-30) 100 UNIT/ML KwikPen [Pharmacy Med Name: HumuLIN 70/30 KwikPen Subcutaneous Suspension Pen-injector (70-30) 100 UNIT/ML] 15 mL 0    Sig: DIAL AND INJECT 50 UNITS UNDER THE SKIN TWICE DAILY. MAX DAILY DOSE IS 100 UNITS.     Endocrinology:  Diabetes - Insulins Passed - 10/28/2022  8:03 AM      Passed - HBA1C is between 0 and 7.9 and within 180 days    HbA1c, POC (controlled diabetic range)  Date Value Ref Range Status  07/24/2022 7.2 (A) 0.0 - 7.0 % Final         Passed - Valid encounter within last 6 months    Recent Outpatient Visits           3 months ago Type 2 diabetes mellitus with other specified complication, with long-term current use of insulin (HCC)   [facility] [place], [place], MD   10 months ago Type 2 diabetes mellitus with other specified complication, with long-term current use of insulin (HCC)   [place] [place], [place], MD   1 year ago Type 2 diabetes mellitus with other specified complication, with long-term current use of insulin [facility])   [place] [facility] [name], MD   2 years ago Screening for colon cancer   [place] [facility] [place], [name], MD   3 years ago Type 2 diabetes mellitus with other specified complication, with long-term current use of insulin [facility])   [facility] [name], MD

## 2022-10-31 ENCOUNTER — Other Ambulatory Visit: Payer: Self-pay | Admitting: Pharmacist

## 2022-10-31 DIAGNOSIS — Z794 Long term (current) use of insulin: Secondary | ICD-10-CM

## 2022-10-31 MED ORDER — HUMULIN 70/30 KWIKPEN (70-30) 100 UNIT/ML ~~LOC~~ SUPN: 50.0000 [IU] | PEN_INJECTOR | Freq: Two times a day (BID) | SUBCUTANEOUS | 0 refills | Status: DC

## 2022-12-19 ENCOUNTER — Other Ambulatory Visit: Payer: Self-pay

## 2022-12-19 ENCOUNTER — Other Ambulatory Visit: Payer: Self-pay | Admitting: Family Medicine

## 2022-12-19 DIAGNOSIS — Z794 Long term (current) use of insulin: Secondary | ICD-10-CM

## 2022-12-19 MED ORDER — TRULICITY 1.5 MG/0.5ML ~~LOC~~ SOAJ
1.5000 mg | SUBCUTANEOUS | 0 refills | Status: DC
  Filled 2022-12-19: qty 2, 28d supply, fill #0

## 2022-12-19 MED ORDER — HUMULIN 70/30 KWIKPEN (70-30) 100 UNIT/ML ~~LOC~~ SUPN
50.0000 [IU] | PEN_INJECTOR | Freq: Two times a day (BID) | SUBCUTANEOUS | 0 refills | Status: DC
  Filled 2022-12-19: qty 90, 90d supply, fill #0

## 2022-12-22 ENCOUNTER — Other Ambulatory Visit: Payer: Self-pay

## 2023-01-05 ENCOUNTER — Other Ambulatory Visit: Payer: Self-pay

## 2023-02-12 ENCOUNTER — Other Ambulatory Visit: Payer: Self-pay | Admitting: Family Medicine

## 2023-02-12 ENCOUNTER — Other Ambulatory Visit: Payer: Self-pay

## 2023-02-12 DIAGNOSIS — Z794 Long term (current) use of insulin: Secondary | ICD-10-CM

## 2023-02-12 MED ORDER — TRULICITY 1.5 MG/0.5ML ~~LOC~~ SOAJ
1.5000 mg | SUBCUTANEOUS | 1 refills | Status: DC
  Filled 2023-02-12: qty 2, 28d supply, fill #0
  Filled 2023-03-27: qty 2, 28d supply, fill #1

## 2023-02-12 NOTE — Telephone Encounter (Signed)
Requested medication (s) are due for refill today: yes  Requested medication (s) are on the active medication list: yes  Last refill:  12/19/22  Future visit scheduled: yes  Notes to clinic:  Unable to refill per protocol due to failed labs, no  A1c updated results.       Requested Prescriptions  Pending Prescriptions Disp Refills   Dulaglutide (TRULICITY) 1.5 MG/0.5ML SOAJ 2 mL 0    Sig: Inject 1.5 mg into the skin once a week.     Endocrinology:  Diabetes - GLP-1 Receptor Agonists Failed - 02/12/2023  9:41 AM      Failed - HBA1C is between 0 and 7.9 and within 180 days    HbA1c, POC (controlled diabetic range)  Date Value Ref Range Status  07/24/2022 7.2 (A) 0.0 - 7.0 % Final         Failed - Valid encounter within last 6 months    Recent Outpatient Visits           6 months ago Type 2 diabetes mellitus with other specified complication, with long-term current use of insulin (HCC)   [facility] [place]. [facility] [name], MD   1 year ago Type 2 diabetes mellitus with other specified complication, with long-term current use of insulin (HCC)   [place] [facility] [place]. [facility] [name], MD   1 year ago Type 2 diabetes mellitus with other specified complication, with long-term current use of insulin (HCC)   [place] [facility] [place] [facility] [place]. [facility] [name], MD   2 years ago Screening for colon cancer   [place] [facility] [place] [facility] [place]. [facility] [name], MD   3 years ago Type 2 diabetes mellitus with other specified complication, with long-term current use of insulin (HCC)   [place] [facility] [place] [facility] [place]. [facility] [name], MD       Future Appointments             In 2 months [name], MD [facility] - [facility] Dept Of [place].  [facility]

## 2023-02-12 NOTE — Telephone Encounter (Signed)
Medication Refill -  Most Recent Primary Care Visit:  Provider: [name]  Department: [facility]  Visit Type: OFFICE VISIT  Date: 07/24/2022  Medication:   insulin  *Patient states that it is the one he takes weekly and needs the insulin he takes weekly, he has the pen needles but needs the insulin, he thinks it starts with a T but doesn't know the name of the medication and does not have the box to get the name, states 4 comes in a box. Patient states he has of the 70/30 but needs the one that starts with the T  *completely out    Has the patient contacted their pharmacy? No. I advised patient to check with the pharmacy and will transfer the caller once I am finished with the call  Is this the correct pharmacy for this prescription? Yes the one listed below If no, delete pharmacy and type the correct one.  This is the patient's preferred pharmacy:  [facility] [address], [address] [place] [address] Phone: [phone] Fax: [phone]   Has the prescription been filled recently? I am not sure  Is the patient out of the medication? Yes  Has the patient been seen for an appointment in the last year OR does the patient have an upcoming appointment? Last seen by PCP on 4.25.2024

## 2023-02-13 ENCOUNTER — Ambulatory Visit: Payer: Medicaid Other

## 2023-02-13 ENCOUNTER — Other Ambulatory Visit: Payer: Self-pay

## 2023-03-27 ENCOUNTER — Other Ambulatory Visit: Payer: Self-pay | Admitting: Family Medicine

## 2023-03-27 ENCOUNTER — Other Ambulatory Visit: Payer: Self-pay

## 2023-03-27 MED ORDER — OMEGA-3-ACID ETHYL ESTERS 1 G PO CAPS
3.0000 g | ORAL_CAPSULE | Freq: Every day | ORAL | 0 refills | Status: DC
  Filled 2023-03-27 – 2023-04-23 (×2): qty 270, 90d supply, fill #0

## 2023-04-08 ENCOUNTER — Other Ambulatory Visit: Payer: Self-pay

## 2023-04-23 ENCOUNTER — Other Ambulatory Visit: Payer: Self-pay

## 2023-04-23 ENCOUNTER — Other Ambulatory Visit: Payer: Self-pay | Admitting: Family Medicine

## 2023-04-23 DIAGNOSIS — Z794 Long term (current) use of insulin: Secondary | ICD-10-CM

## 2023-04-23 MED ORDER — TRULICITY 1.5 MG/0.5ML ~~LOC~~ SOAJ
1.5000 mg | SUBCUTANEOUS | 0 refills | Status: DC
  Filled 2023-04-23: qty 2, 28d supply, fill #0

## 2023-04-23 NOTE — Telephone Encounter (Signed)
Requested medication (s) are due for refill today: yes  Requested medication (s) are on the active medication list: yes  Last refill:  02/12/23  Future visit scheduled: yes  Notes to clinic:  Unable to refill per protocol, courtesy refill already given, routing for provider approval.      Requested Prescriptions  Pending Prescriptions Disp Refills   Dulaglutide (TRULICITY) 1.5 MG/0.5ML SOAJ 2 mL 1    Sig: Inject 1.5 mg into the skin once a week.     Endocrinology:  Diabetes - GLP-1 Receptor Agonists Failed - 04/23/2023  2:58 PM      Failed - HBA1C is between 0 and 7.9 and within 180 days    HbA1c, POC (controlled diabetic range)  Date Value Ref Range Status  07/24/2022 7.2 (A) 0.0 - 7.0 % Final         Failed - Valid encounter within last 6 months    Recent Outpatient Visits           9 months ago Type 2 diabetes mellitus with other specified complication, with long-term current use of insulin (HCC)   [place] [facility] [place]. [facility] [name], MD   1 year ago Type 2 diabetes mellitus with other specified complication, with long-term current use of insulin (HCC)   [place] [place]. [facility] [name], MD   1 year ago Type 2 diabetes mellitus with other specified complication, with long-term current use of insulin (HCC)   [place] [facility] [place] [facility] [place]. [facility] [name], MD   2 years ago Screening for colon cancer   [place] [facility] [place] [facility] [place]. [facility] [name], MD   3 years ago Type 2 diabetes mellitus with other specified complication, with long-term current use of insulin (HCC)    [facility] [place] [facility] [place] [facility] Dept Of [place]. [facility] [name], MD       Future Appointments             In 1 week [name], MD [facility] - A Dept  Of [place]. [facility]

## 2023-04-24 ENCOUNTER — Other Ambulatory Visit: Payer: Self-pay

## 2023-04-28 ENCOUNTER — Other Ambulatory Visit: Payer: Self-pay

## 2023-05-04 ENCOUNTER — Other Ambulatory Visit: Payer: Self-pay

## 2023-05-04 ENCOUNTER — Ambulatory Visit: Payer: Medicaid Other | Attending: Family Medicine | Admitting: Family Medicine

## 2023-05-04 ENCOUNTER — Encounter: Payer: Self-pay | Admitting: Family Medicine

## 2023-05-04 VITALS — BP 114/74 | HR 68 | Ht 73.0 in | Wt 278.6 lb

## 2023-05-04 DIAGNOSIS — I5022 Chronic systolic (congestive) heart failure: Secondary | ICD-10-CM

## 2023-05-04 DIAGNOSIS — Z23 Encounter for immunization: Secondary | ICD-10-CM | POA: Diagnosis not present

## 2023-05-04 DIAGNOSIS — R197 Diarrhea, unspecified: Secondary | ICD-10-CM | POA: Diagnosis not present

## 2023-05-04 DIAGNOSIS — M62838 Other muscle spasm: Secondary | ICD-10-CM

## 2023-05-04 DIAGNOSIS — E1169 Type 2 diabetes mellitus with other specified complication: Secondary | ICD-10-CM | POA: Diagnosis not present

## 2023-05-04 DIAGNOSIS — E1159 Type 2 diabetes mellitus with other circulatory complications: Secondary | ICD-10-CM

## 2023-05-04 DIAGNOSIS — E785 Hyperlipidemia, unspecified: Secondary | ICD-10-CM | POA: Diagnosis not present

## 2023-05-04 DIAGNOSIS — Z7984 Long term (current) use of oral hypoglycemic drugs: Secondary | ICD-10-CM

## 2023-05-04 DIAGNOSIS — Z794 Long term (current) use of insulin: Secondary | ICD-10-CM

## 2023-05-04 DIAGNOSIS — G5601 Carpal tunnel syndrome, right upper limb: Secondary | ICD-10-CM

## 2023-05-04 DIAGNOSIS — Z7985 Long-term (current) use of injectable non-insulin antidiabetic drugs: Secondary | ICD-10-CM

## 2023-05-04 LAB — POCT GLYCOSYLATED HEMOGLOBIN (HGB A1C): HbA1c, POC (controlled diabetic range): 7.5 % — AB (ref 0.0–7.0)

## 2023-05-04 MED ORDER — TRULICITY 1.5 MG/0.5ML ~~LOC~~ SOAJ
1.5000 mg | SUBCUTANEOUS | 6 refills | Status: AC
  Filled 2023-05-04 – 2023-06-09 (×2): qty 2, 28d supply, fill #0
  Filled 2023-07-24: qty 2, 28d supply, fill #1
  Filled 2023-10-05: qty 2, 28d supply, fill #2
  Filled 2023-11-13: qty 2, 28d supply, fill #3
  Filled 2024-01-20: qty 2, 28d supply, fill #4
  Filled 2024-02-18: qty 2, 28d supply, fill #5
  Filled 2024-04-03: qty 2, 28d supply, fill #6

## 2023-05-04 MED ORDER — OMEGA-3-ACID ETHYL ESTERS 1 G PO CAPS
2.0000 g | ORAL_CAPSULE | Freq: Every day | ORAL | 1 refills | Status: DC
  Filled 2023-05-04 – 2023-08-12 (×2): qty 180, 90d supply, fill #0
  Filled 2023-11-13: qty 180, 90d supply, fill #1

## 2023-05-04 MED ORDER — CARVEDILOL 25 MG PO TABS
25.0000 mg | ORAL_TABLET | Freq: Two times a day (BID) | ORAL | 1 refills | Status: DC
  Filled 2023-05-04: qty 180, 90d supply, fill #0

## 2023-05-04 MED ORDER — AMLODIPINE BESYLATE 10 MG PO TABS
10.0000 mg | ORAL_TABLET | Freq: Every day | ORAL | 1 refills | Status: DC
  Filled 2023-05-04: qty 90, 90d supply, fill #0
  Filled 2023-08-11: qty 90, 90d supply, fill #1

## 2023-05-04 MED ORDER — GLIPIZIDE 10 MG PO TABS
10.0000 mg | ORAL_TABLET | Freq: Two times a day (BID) | ORAL | 1 refills | Status: DC
  Filled 2023-05-04: qty 180, 90d supply, fill #0

## 2023-05-04 MED ORDER — LOSARTAN POTASSIUM 100 MG PO TABS
100.0000 mg | ORAL_TABLET | Freq: Every day | ORAL | 1 refills | Status: DC
  Filled 2023-05-04: qty 90, 90d supply, fill #0
  Filled 2023-08-11: qty 90, 90d supply, fill #1

## 2023-05-04 MED ORDER — HUMULIN 70/30 KWIKPEN (70-30) 100 UNIT/ML ~~LOC~~ SUPN
50.0000 [IU] | PEN_INJECTOR | Freq: Two times a day (BID) | SUBCUTANEOUS | 6 refills | Status: DC
  Filled 2023-05-04: qty 90, 90d supply, fill #0

## 2023-05-04 MED ORDER — ATORVASTATIN CALCIUM 20 MG PO TABS
20.0000 mg | ORAL_TABLET | Freq: Every day | ORAL | 1 refills | Status: DC
  Filled 2023-05-04: qty 90, 90d supply, fill #0
  Filled 2023-10-05: qty 90, 90d supply, fill #1

## 2023-05-04 MED ORDER — DICYCLOMINE HCL 10 MG PO CAPS
10.0000 mg | ORAL_CAPSULE | Freq: Two times a day (BID) | ORAL | 1 refills | Status: DC
  Filled 2023-05-04: qty 60, 30d supply, fill #0
  Filled 2023-07-24: qty 60, 30d supply, fill #1

## 2023-05-04 MED ORDER — SPIRONOLACTONE 25 MG PO TABS
25.0000 mg | ORAL_TABLET | Freq: Every day | ORAL | 1 refills | Status: DC
  Filled 2023-05-04: qty 90, 90d supply, fill #0
  Filled 2023-08-11: qty 90, 90d supply, fill #1

## 2023-05-04 NOTE — Progress Notes (Signed)
Subjective:  Patient ID: Dave Arnold, male    DOB: 1971-01-22  Age: 53 y.o. MRN: [id number]  CC: Medical Management of Chronic Issues (Right hand pain/Discuss trulicity/)   HPI GODWIN TEDESCO is a 53 y.o. year old male with a history of type 2 diabetes mellitus (A1c 7.2), hypertension, hyperlipidemia, CHF (EF 50-55% from 06/2013), obesity who is seen for a follow-up visit.   Interval History: Discussed the use of AI scribe software for clinical note transcription with the patient, who gave verbal consent to proceed.  He presents with diarrhea and right wrist pain. He reports that after eating, he often experiences sudden, urgent diarrhea, sometimes even resulting in accidents. The diarrhea is described as watery and occurs shortly after eating, with no apparent relation to specific foods. He denies any associated abdominal pain or blood in the stool.  In addition to the gastrointestinal symptoms, the patient also reports intermittent right wrist pain, which he attributes to his history of carpal tunnel syndrome. The pain is described as an ache, and is severe enough to limit his ability to lift objects. The pain is intermittent, with episodes lasting several days, followed by periods of relief. He denies any associated numbness or tingling.  The patient also mentions lower back pain, which he attributes to a previous job that involved heavy lifting. The pain is described as a shock-like sensation that occurs after prolonged sitting. He reports that the pain is severe enough to limit his mobility, and he has difficulty getting up from a lying position. He has tried physical therapy but found it too strenuous.   A1C is 7.5 up from 7.2 previously and he endorses adherence with his current regimen of Trulicity, glipizide, insulin. He is doing well on his antihypertensive and he is also on a statin as well as Lovaza       Past Medical History:  Diagnosis Date   Diabetes mellitus  without complication (HCC)    Heart failure of unknown type (HCC) 06/2011   Hypertension    Obesity    Thrombocytopenia (HCC)     No past surgical history on file.  Family History  Problem Relation Age of Onset   Heart disease Mother     Social History   Socioeconomic History   Marital status: Single    Spouse name: Not on file   Number of children: Not on file   Years of education: Not on file   Highest education level: 9th grade  Occupational History   Not on file  Tobacco Use   Smoking status: Never   Smokeless tobacco: Never  Vaping Use   Vaping status: Never Used  Substance and Sexual Activity   Alcohol use: No   Drug use: No   Sexual activity: Not on file  Other Topics Concern   Not on file  Social History Narrative   He is married and has two daughters, was in prison years ago but released in 2008. Denies any smoking, drinking, or drugs.    Social Drivers of Corporate investment banker Strain: Low Risk  (05/03/2023)   Overall Financial Resource Strain (CARDIA)    Difficulty of Paying Living Expenses: Not hard at all  Food Insecurity: No Food Insecurity (05/03/2023)   Hunger Vital Sign    Worried About Running Out of Food in the Last Year: Never true    Ran Out of Food in the Last Year: Never true  Transportation Needs: No Transportation Needs (05/03/2023)   PRAPARE -  Administrator, Civil Service (Medical): No    Lack of Transportation (Non-Medical): No  Physical Activity: Sufficiently Active (05/04/2023)   Exercise Vital Sign    Days of Exercise per Week: 3 days    Minutes of Exercise per Session: 60 min  Stress: Stress Concern Present (05/04/2023)   [employer] of Occupational Health - Occupational Stress Questionnaire    Feeling of Stress : To some extent  Social Connections: Socially Isolated (05/03/2023)   Social Connection and Isolation Panel [NHANES]    Frequency of Communication with Friends and Family: More than three times a week     Frequency of Social Gatherings with Friends and Family: More than three times a week    Attends Religious Services: Never    Database administrator or Organizations: No    Attends Engineer, structural: Not on file    Marital Status: Never married    Allergies  Allergen Reactions   Lisinopril     cough    Outpatient Medications Prior to Visit  Medication Sig Dispense Refill   Continuous Blood Gluc Receiver (FREESTYLE LIBRE 14 DAY READER) DEVI 1 Device by Does not apply route 3 (three) times daily. 1 each 0   Continuous Blood Gluc Sensor (FREESTYLE LIBRE 14 DAY SENSOR) MISC 1 Device by Does not apply route every 14 (fourteen) days. Use to check blood sugar daily. Use one sensor for 14 days. 2 each 6   EPINEPHrine (EPIPEN 2-PAK) 0.3 mg/0.3 mL IJ SOAJ injection Inject 0.3 mLs (0.3 mg total) into the muscle once. 1 Device 0   gabapentin (NEURONTIN) 300 MG capsule Take 1 capsule (300 mg total) by mouth at bedtime. 30 capsule 3   ibuprofen (ADVIL) 600 MG tablet Take 1 tablet (600 mg total) by mouth every 6 (six) hours as needed. 30 tablet 0   Insulin Pen Needle (TRUEPLUS 5-BEVEL PEN NEEDLES) 32G X 4 MM MISC Use as instructed. 100 each 2   Insulin Syringe-Needle U-100 (BD INSULIN SYRINGE ULTRAFINE) 31G X 15/64" 0.5 ML MISC 1 each by Does not apply route 2 (two) times daily. 100 each 5   oxyCODONE-acetaminophen (PERCOCET/ROXICET) 5-325 MG tablet Take 1 tablet by mouth every 6 (six) hours as needed for severe pain. 10 tablet 0   sildenafil (VIAGRA) 25 MG tablet TAKE 2 TABLETS (50 MG TOTAL) BY MOUTH DAILY AS NEEDED FOR ERECTILE DYSFUNCTION. AT LEAST 24 HOURS BETWEEN DOSES 20 tablet 2   amLODipine (NORVASC) 10 MG tablet Take 1 tablet (10 mg total) by mouth daily. 90 tablet 1   atorvastatin (LIPITOR) 20 MG tablet Take 1 tablet (20 mg total) by mouth daily. 90 tablet 1   carvedilol (COREG) 25 MG tablet Take 1 tablet (25 mg total) by mouth 2 (two) times daily with a meal. 180 tablet 1    Dulaglutide (TRULICITY) 1.5 MG/0.5ML SOAJ Inject 1.5 mg into the skin once a week. 2 mL 0   glipiZIDE (GLUCOTROL) 10 MG tablet Take 1 tablet (10 mg total) by mouth 2 (two) times daily before a meal. 180 tablet 1   insulin isophane & regular human KwikPen (HUMULIN 70/30 KWIKPEN) (70-30) 100 UNIT/ML KwikPen Inject 50 Units into the skin 2 (two) times daily. 90 mL 0   losartan (COZAAR) 100 MG tablet Take 1 tablet (100 mg total) by mouth daily. 90 tablet 1   omega-3 acid ethyl esters (LOVAZA) 1 g capsule Take 3 capsules (3 g total) by mouth daily. 270 capsule 0  predniSONE (DELTASONE) 20 MG tablet Take 1 tablet (20 mg total) by mouth daily with breakfast. 5 tablet 0   spironolactone (ALDACTONE) 25 MG tablet Take 1 tablet (25 mg total) by mouth daily. 90 tablet 1   aspirin EC 81 MG tablet Take 81 mg by mouth daily.      carbamide peroxide (DEBROX) 6.5 % OTIC solution Place 5 drops into the right ear 2 (two) times daily. 15 mL 0   cetirizine (ALLERGY RELIEF CETIRIZINE) 10 MG tablet Take 1 tablet (10 mg total) by mouth daily. 30 tablet 2   clotrimazole (LOTRIMIN) 1 % cream Apply 1 application topically 2 (two) times daily. 30 g 0   ibuprofen (ADVIL,MOTRIN) 600 MG tablet Take 1 tablet (600 mg total) by mouth every 6 (six) hours as needed. 30 tablet 0   No facility-administered medications prior to visit.     ROS Review of Systems  Constitutional:  Negative for activity change and appetite change.  HENT:  Negative for sinus pressure and sore throat.   Respiratory:  Negative for chest tightness, shortness of breath and wheezing.   Cardiovascular:  Negative for chest pain and palpitations.  Gastrointestinal:  Positive for diarrhea. Negative for abdominal distention, abdominal pain and constipation.  Genitourinary: Negative.   Musculoskeletal:        See HPI  Psychiatric/Behavioral:  Negative for behavioral problems and dysphoric mood.     Objective:  BP 114/74   Pulse 68   Ht 6\' 1"  (1.854 m)    Wt 278 lb 9.6 oz (126.4 kg)   SpO2 97%   BMI 36.76 kg/m      05/04/2023    4:27 PM 07/24/2022    2:17 PM 06/26/2022    1:45 AM  BP/Weight  Systolic BP 114 115 130  Diastolic BP 74 74 64  Wt. (Lbs) 278.6 278.6   BMI 36.76 kg/m2 36.76 kg/m2       Physical Exam Constitutional:      Appearance: He is well-developed.  Cardiovascular:     Rate and Rhythm: Normal rate.     Heart sounds: Normal heart sounds. No murmur heard. Pulmonary:     Effort: Pulmonary effort is normal.     Breath sounds: Normal breath sounds. No wheezing or rales.  Chest:     Chest wall: No tenderness.  Abdominal:     General: Bowel sounds are normal. There is no distension.     Palpations: Abdomen is soft. There is no mass.     Tenderness: There is no abdominal tenderness.  Musculoskeletal:        General: Normal range of motion.     Right lower leg: No edema.     Left lower leg: No edema.     Comments: Slight tenderness to palpation of lumbar spine Normal appearance of both wrist with no tenderness.  Negative Phalen and Tinel sign  Neurological:     Mental Status: He is alert and oriented to person, place, and time.  Psychiatric:        Mood and Affect: Mood normal.        Latest Ref Rng & Units 06/26/2022   12:55 AM 12/11/2021    4:30 PM 05/14/2021   11:50 AM  CMP  Glucose 70 - 99 mg/dL 409  82  811   BUN 6 - 20 mg/dL 13  13  15    Creatinine 0.61 - 1.24 mg/dL 9.14  7.82  9.56   Sodium 135 - 145 mmol/L 142  147  145   Potassium 3.5 - 5.1 mmol/L 3.4  4.1  3.8   Chloride 98 - 111 mmol/L 102  108  104   CO2 22 - 32 mmol/L 26  22  27    Calcium 8.9 - 10.3 mg/dL 9.5  9.4  9.6   Total Protein 6.0 - 8.5 g/dL   7.5   Total Bilirubin 0.0 - 1.2 mg/dL   0.9   Alkaline Phos 44 - 121 IU/L   94   AST 0 - 40 IU/L   21   ALT 0 - 44 IU/L   20     Lipid Panel     Component Value Date/Time   CHOL 103 05/14/2021 1150   TRIG 116 05/14/2021 1150   HDL 30 (L) 05/14/2021 1150   CHOLHDL 5.1 (H) 09/05/2020  1008   CHOLHDL 5.6 (H) 05/27/2016 1024   VLDL 46 (H) 04/26/2015 1054   LDLCALC 52 05/14/2021 1150    CBC    Component Value Date/Time   WBC 9.7 06/26/2022 0055   RBC 4.76 06/26/2022 0055   HGB 14.2 06/26/2022 0055   HCT 40.9 06/26/2022 0055   PLT 108 (L) 06/26/2022 0055   MCV 85.9 06/26/2022 0055   MCH 29.8 06/26/2022 0055   MCHC 34.7 06/26/2022 0055   RDW 12.8 06/26/2022 0055   LYMPHSABS 2.2 06/26/2022 0055   MONOABS 0.7 06/26/2022 0055   EOSABS 0.0 06/26/2022 0055   BASOSABS 0.0 06/26/2022 0055    Lab Results  Component Value Date   HGBA1C 7.5 (A) 05/04/2023    Assessment & Plan:      Diarrhea Reports frequent, sudden, watery bowel movements, particularly after meals. Unclear etiology, but patient suspects it may be related to Trulicity. No blood in stool reported. -Order blood tests to check pancreas function and rule out amatory bowel disease -Bentyl initiated -Refer to a Gastroenterologist for further evaluation.  Carpal Tunnel Syndrome Recurrent issues with right hand, particularly in the wrist. Previous history of carpal tunnel syndrome in the same wrist. -Refer to Orthopedics for evaluation and possible injection. -Advise to continue using wrist brace as needed.  Lower Back Pain Reports chronic lower back pain, with episodes of severe pain and muscle spasms. Pain is exacerbated by certain positions and movements. -Order X-ray of the back to evaluate for any structural abnormalities. -Continue current muscle relaxant medication (he states he has some at home), gabapentin -Consider referral to Physical Therapy if pain persists or worsens.  Type 2 Diabetes A1c has increased slightly from 7.2 to 7.5. Patient is currently on Trulicity and reports reduced appetite. -Advise to continue current medication regimen. -Encourage dietary modifications to help control blood glucose levels. -Check A1c regularly to monitor glycemic control.    Hypertension Controlled -Continue antihypertensive -Counseled on blood pressure goal of less than 130/80, low-sodium, DASH diet, medication compliance, 150 minutes of moderate intensity exercise per week. Discussed medication compliance, adverse effects.   Hyperlipidemia -Controlled -Continue statin and Lovaza -Low-cholesterol diet   CHF -Euvolemic with EF of 50 to 55% from echo 06/2013 -Consider ordering echo at next visit to evaluate cardiac function -Will also add on Jardiance if A1c still above goal  Obesity -Currently on GLP-1 RA -Continue to work on decreasing caloric intake         Meds ordered this encounter  Medications   amLODipine (NORVASC) 10 MG tablet    Sig: Take 1 tablet (10 mg total) by mouth daily.    Dispense:  90 tablet  Refill:  1   atorvastatin (LIPITOR) 20 MG tablet    Sig: Take 1 tablet (20 mg total) by mouth daily.    Dispense:  90 tablet    Refill:  1   carvedilol (COREG) 25 MG tablet    Sig: Take 1 tablet (25 mg total) by mouth 2 (two) times daily with a meal.    Dispense:  180 tablet    Refill:  1   Dulaglutide (TRULICITY) 1.5 MG/0.5ML SOAJ    Sig: Inject 1.5 mg into the skin once a week.    Dispense:  2 mL    Refill:  6   glipiZIDE (GLUCOTROL) 10 MG tablet    Sig: Take 1 tablet (10 mg total) by mouth 2 (two) times daily before a meal.    Dispense:  180 tablet    Refill:  1   insulin isophane & regular human KwikPen (HUMULIN 70/30 KWIKPEN) (70-30) 100 UNIT/ML KwikPen    Sig: Inject 50 Units into the skin 2 (two) times daily.    Dispense:  90 mL    Refill:  6   losartan (COZAAR) 100 MG tablet    Sig: Take 1 tablet (100 mg total) by mouth daily.    Dispense:  90 tablet    Refill:  1   omega-3 acid ethyl esters (LOVAZA) 1 g capsule    Sig: Take 2 capsules (2 g total) by mouth daily.    Dispense:  180 capsule    Refill:  1   spironolactone (ALDACTONE) 25 MG tablet    Sig: Take 1 tablet (25 mg total) by mouth daily.     Dispense:  90 tablet    Refill:  1    Follow-up: Return in about 6 months (around 11/01/2023) for Chronic medical conditions.       [name], MD, FAAFP. [facility] [place], [place] [phone]   05/04/2023, 5:02 PM

## 2023-05-04 NOTE — Patient Instructions (Signed)
VISIT SUMMARY:  During today's visit, we discussed your recent gastrointestinal distress, right wrist pain, lower back pain, and management of your Type 2 diabetes. We have outlined a plan to address each of these issues and will follow up as needed.  YOUR PLAN:  -GASTROINTESTINAL DISTRESS: You have been experiencing frequent, sudden, watery bowel movements, particularly after meals. This could be related to your medication, Trulicity. We will conduct blood tests to check your pancreas function and rule out other conditions like Crohn's disease. Additionally, you will be referred to a Gastroenterologist for further evaluation.  -CARPAL TUNNEL SYNDROME: You have recurrent issues with your right wrist, which is likely due to your history of carpal tunnel syndrome. We will refer you to Orthopedics for an evaluation and possible injection. In the meantime, please continue using your wrist brace as needed.  -LOWER BACK PAIN: You have chronic lower back pain that worsens with certain positions and movements. We will order an X-ray to check for any structural issues. You should continue taking your muscle relaxant medication, and if the pain persists or worsens, we may refer you to Physical Therapy.  -TYPE 2 DIABETES: Your A1c level has slightly increased from 7.2 to 7.5. You should continue your current medication, Trulicity, and make dietary changes to help control your blood sugar levels. We will regularly check your A1c to monitor your diabetes.  INSTRUCTIONS:  Please follow up with the blood tests and X-ray as ordered. You will also need to schedule appointments with the Gastroenterologist and Orthopedics as referred. Continue your current medications and use your wrist brace as needed. If your lower back pain persists or worsens, we may consider referring you to Physical Therapy.

## 2023-05-05 ENCOUNTER — Other Ambulatory Visit: Payer: Self-pay

## 2023-05-05 ENCOUNTER — Encounter: Payer: Self-pay | Admitting: Family Medicine

## 2023-05-05 LAB — CMP14+EGFR
ALT: 18 [IU]/L (ref 0–44)
AST: 21 [IU]/L (ref 0–40)
Albumin: 4.4 g/dL (ref 4.1–5.1)
Alkaline Phosphatase: 92 [IU]/L (ref 44–121)
BUN/Creatinine Ratio: 11 (ref 9–20)
BUN: 11 mg/dL (ref 6–24)
Bilirubin Total: 1.2 mg/dL (ref 0.0–1.2)
CO2: 24 mmol/L (ref 20–29)
Calcium: 9.8 mg/dL (ref 8.7–10.2)
Chloride: 105 mmol/L (ref 96–106)
Creatinine, Ser: 0.98 mg/dL (ref 0.76–1.27)
Globulin, Total: 2.8 g/dL (ref 1.5–4.5)
Glucose: 67 mg/dL — ABNORMAL LOW (ref 70–99)
Potassium: 3.6 mmol/L (ref 3.5–5.2)
Sodium: 147 mmol/L — ABNORMAL HIGH (ref 134–144)
Total Protein: 7.2 g/dL (ref 6.0–8.5)
eGFR: 94 mL/min/{1.73_m2} (ref >59)

## 2023-05-05 LAB — LP+NON-HDL CHOLESTEROL
Cholesterol, Total: 93 mg/dL — ABNORMAL LOW (ref 100–199)
HDL: 30 mg/dL — ABNORMAL LOW (ref >39)
LDL Chol Calc (NIH): 45 mg/dL (ref 0–99)
Total Non-HDL-Chol (LDL+VLDL): 63 mg/dL (ref 0–129)
Triglycerides: 89 mg/dL (ref 0–149)
VLDL Cholesterol Cal: 18 mg/dL (ref 5–40)

## 2023-05-05 LAB — MICROALBUMIN / CREATININE URINE RATIO
Creatinine, Urine: 320.8 mg/dL
Microalb/Creat Ratio: 35 mg/g{creat} — ABNORMAL HIGH (ref 0–29)
Microalbumin, Urine: 113.1 ug/mL

## 2023-05-05 LAB — AMYLASE

## 2023-05-05 LAB — LIPASE

## 2023-05-06 ENCOUNTER — Other Ambulatory Visit: Payer: Self-pay

## 2023-05-08 ENCOUNTER — Other Ambulatory Visit: Payer: Self-pay

## 2023-06-09 ENCOUNTER — Other Ambulatory Visit: Payer: Self-pay

## 2023-07-24 ENCOUNTER — Other Ambulatory Visit: Payer: Self-pay

## 2023-07-24 ENCOUNTER — Other Ambulatory Visit: Payer: Self-pay | Admitting: Family Medicine

## 2023-07-24 DIAGNOSIS — N528 Other male erectile dysfunction: Secondary | ICD-10-CM

## 2023-07-24 MED ORDER — SILDENAFIL CITRATE 25 MG PO TABS
ORAL_TABLET | ORAL | 2 refills | Status: AC
  Filled 2023-07-24: qty 20, 30d supply, fill #0

## 2023-07-26 ENCOUNTER — Other Ambulatory Visit: Payer: Self-pay

## 2023-07-27 ENCOUNTER — Other Ambulatory Visit: Payer: Self-pay

## 2023-07-27 ENCOUNTER — Other Ambulatory Visit: Payer: Self-pay | Admitting: Family Medicine

## 2023-07-27 DIAGNOSIS — G5601 Carpal tunnel syndrome, right upper limb: Secondary | ICD-10-CM

## 2023-07-28 ENCOUNTER — Other Ambulatory Visit: Payer: Self-pay

## 2023-07-28 MED ORDER — GABAPENTIN 300 MG PO CAPS
300.0000 mg | ORAL_CAPSULE | Freq: Every day | ORAL | 1 refills | Status: DC
  Filled 2023-07-28 – 2023-08-11 (×2): qty 90, 90d supply, fill #0

## 2023-07-29 ENCOUNTER — Other Ambulatory Visit: Payer: Self-pay

## 2023-08-10 ENCOUNTER — Other Ambulatory Visit: Payer: Self-pay

## 2023-08-11 ENCOUNTER — Other Ambulatory Visit: Payer: Self-pay

## 2023-08-12 ENCOUNTER — Other Ambulatory Visit: Payer: Self-pay | Admitting: Family Medicine

## 2023-08-12 ENCOUNTER — Other Ambulatory Visit: Payer: Self-pay

## 2023-08-12 DIAGNOSIS — G8929 Other chronic pain: Secondary | ICD-10-CM

## 2023-08-12 MED ORDER — TECHLITE PLUS PEN NEEDLES 32G X 4 MM MISC
2 refills | Status: AC
  Filled 2023-08-12 (×2): qty 100, 34d supply, fill #0
  Filled 2023-11-13: qty 100, 34d supply, fill #1
  Filled 2024-03-10: qty 100, 34d supply, fill #2

## 2023-08-12 MED ORDER — CYCLOBENZAPRINE HCL 10 MG PO TABS
10.0000 mg | ORAL_TABLET | Freq: Two times a day (BID) | ORAL | 2 refills | Status: DC | PRN
  Filled 2023-08-12: qty 30, 15d supply, fill #0
  Filled 2023-10-05: qty 30, 15d supply, fill #1

## 2023-08-18 ENCOUNTER — Other Ambulatory Visit: Payer: Self-pay

## 2023-10-05 ENCOUNTER — Other Ambulatory Visit: Payer: Self-pay

## 2023-10-06 ENCOUNTER — Other Ambulatory Visit: Payer: Self-pay

## 2023-10-07 ENCOUNTER — Other Ambulatory Visit: Payer: Self-pay

## 2023-10-09 ENCOUNTER — Other Ambulatory Visit: Payer: Self-pay

## 2023-10-30 ENCOUNTER — Telehealth: Payer: Self-pay | Admitting: Family Medicine

## 2023-10-30 NOTE — Telephone Encounter (Signed)
 Called patient to confirm upcoming appointment 11/02/2023 at 4:10 pm. Patient appointment has been successfully confirmed

## 2023-11-02 ENCOUNTER — Other Ambulatory Visit: Payer: Self-pay

## 2023-11-02 ENCOUNTER — Ambulatory Visit: Payer: Medicaid Other | Attending: Family Medicine | Admitting: Family Medicine

## 2023-11-02 ENCOUNTER — Encounter: Payer: Self-pay | Admitting: Family Medicine

## 2023-11-02 VITALS — BP 123/83 | HR 94 | Ht 73.0 in | Wt 276.8 lb

## 2023-11-02 DIAGNOSIS — I152 Hypertension secondary to endocrine disorders: Secondary | ICD-10-CM

## 2023-11-02 DIAGNOSIS — Z7985 Long-term (current) use of injectable non-insulin antidiabetic drugs: Secondary | ICD-10-CM | POA: Diagnosis not present

## 2023-11-02 DIAGNOSIS — Z794 Long term (current) use of insulin: Secondary | ICD-10-CM

## 2023-11-02 DIAGNOSIS — E785 Hyperlipidemia, unspecified: Secondary | ICD-10-CM | POA: Diagnosis not present

## 2023-11-02 DIAGNOSIS — I5040 Unspecified combined systolic (congestive) and diastolic (congestive) heart failure: Secondary | ICD-10-CM | POA: Diagnosis not present

## 2023-11-02 DIAGNOSIS — I110 Hypertensive heart disease with heart failure: Secondary | ICD-10-CM | POA: Diagnosis not present

## 2023-11-02 DIAGNOSIS — Z7984 Long term (current) use of oral hypoglycemic drugs: Secondary | ICD-10-CM

## 2023-11-02 DIAGNOSIS — E1169 Type 2 diabetes mellitus with other specified complication: Secondary | ICD-10-CM

## 2023-11-02 DIAGNOSIS — M5451 Vertebrogenic low back pain: Secondary | ICD-10-CM

## 2023-11-02 DIAGNOSIS — Z91148 Patient's other noncompliance with medication regimen for other reason: Secondary | ICD-10-CM

## 2023-11-02 DIAGNOSIS — R197 Diarrhea, unspecified: Secondary | ICD-10-CM

## 2023-11-02 DIAGNOSIS — R109 Unspecified abdominal pain: Secondary | ICD-10-CM

## 2023-11-02 DIAGNOSIS — G8929 Other chronic pain: Secondary | ICD-10-CM

## 2023-11-02 DIAGNOSIS — K117 Disturbances of salivary secretion: Secondary | ICD-10-CM

## 2023-11-02 DIAGNOSIS — E1159 Type 2 diabetes mellitus with other circulatory complications: Secondary | ICD-10-CM

## 2023-11-02 LAB — POCT GLYCOSYLATED HEMOGLOBIN (HGB A1C): HbA1c, POC (controlled diabetic range): 9.5 % — AB (ref 0.0–7.0)

## 2023-11-02 MED ORDER — HUMULIN 70/30 KWIKPEN (70-30) 100 UNIT/ML ~~LOC~~ SUPN
50.0000 [IU] | PEN_INJECTOR | Freq: Two times a day (BID) | SUBCUTANEOUS | 6 refills | Status: AC
  Filled 2023-11-02: qty 90, 90d supply, fill #0
  Filled 2024-04-04: qty 90, 90d supply, fill #1

## 2023-11-02 MED ORDER — DULOXETINE HCL 60 MG PO CPEP
60.0000 mg | ORAL_CAPSULE | Freq: Every day | ORAL | 3 refills | Status: DC
  Filled 2023-11-02: qty 30, 30d supply, fill #0
  Filled 2023-12-15: qty 30, 30d supply, fill #1
  Filled 2024-01-19: qty 30, 30d supply, fill #2
  Filled 2024-02-18: qty 30, 30d supply, fill #3

## 2023-11-02 MED ORDER — SPIRONOLACTONE 25 MG PO TABS
25.0000 mg | ORAL_TABLET | Freq: Every day | ORAL | 1 refills | Status: AC
  Filled 2023-11-02: qty 90, 90d supply, fill #0
  Filled 2024-03-10: qty 90, 90d supply, fill #1

## 2023-11-02 MED ORDER — GLIPIZIDE 10 MG PO TABS
10.0000 mg | ORAL_TABLET | Freq: Two times a day (BID) | ORAL | 1 refills | Status: AC
  Filled 2023-11-02: qty 180, 90d supply, fill #0
  Filled 2024-04-04 (×2): qty 180, 90d supply, fill #1

## 2023-11-02 MED ORDER — CARVEDILOL 25 MG PO TABS
25.0000 mg | ORAL_TABLET | Freq: Two times a day (BID) | ORAL | 1 refills | Status: AC
  Filled 2023-11-02: qty 180, 90d supply, fill #0
  Filled 2024-04-04: qty 180, 90d supply, fill #1

## 2023-11-02 MED ORDER — ATORVASTATIN CALCIUM 20 MG PO TABS
20.0000 mg | ORAL_TABLET | Freq: Every day | ORAL | 1 refills | Status: AC
  Filled 2023-11-02 – 2024-01-19 (×2): qty 90, 90d supply, fill #0
  Filled 2024-04-03: qty 90, 90d supply, fill #1

## 2023-11-02 MED ORDER — CYCLOBENZAPRINE HCL 10 MG PO TABS
10.0000 mg | ORAL_TABLET | Freq: Two times a day (BID) | ORAL | 2 refills | Status: DC | PRN
  Filled 2023-11-02: qty 60, 30d supply, fill #0
  Filled 2023-12-15: qty 60, 30d supply, fill #1
  Filled 2024-02-21: qty 60, 30d supply, fill #2

## 2023-11-02 MED ORDER — LOSARTAN POTASSIUM 100 MG PO TABS
100.0000 mg | ORAL_TABLET | Freq: Every day | ORAL | 1 refills | Status: AC
  Filled 2023-11-02 – 2023-12-15 (×2): qty 90, 90d supply, fill #0
  Filled 2024-03-10: qty 90, 90d supply, fill #1

## 2023-11-02 MED ORDER — AMLODIPINE BESYLATE 10 MG PO TABS
10.0000 mg | ORAL_TABLET | Freq: Every day | ORAL | 1 refills | Status: AC
  Filled 2023-11-02: qty 90, 90d supply, fill #0
  Filled 2024-03-10: qty 90, 90d supply, fill #1

## 2023-11-02 MED ORDER — GABAPENTIN 300 MG PO CAPS
600.0000 mg | ORAL_CAPSULE | Freq: Every day | ORAL | 1 refills | Status: AC
  Filled 2023-11-02 – 2023-11-13 (×2): qty 180, 90d supply, fill #0
  Filled 2024-04-03: qty 180, 90d supply, fill #1

## 2023-11-02 NOTE — Progress Notes (Unsigned)
 Subjective:  Patient ID: Dave Arnold, male    DOB: [birth year]  Age: 53 y.o. MRN: [id number]  CC: Medical Management of Chronic Issues (Lower back pain/Abdominal pain)     Discussed the use of AI scribe software for clinical note transcription with the patient, who gave verbal consent to proceed.  History of Present Illness Dave Arnold is a 53 year old male with a history of type 2 diabetes mellitus (A1c 7.2), hypertension, hyperlipidemia, CHF (EF 50-55% from 06/2013), obesity  who presents with lower back pain and diarrhea. He is accompanied by his girlfriend.  He experiences persistent lower back pain, rated as 5 out of 10, throbbing, and located on the right side. The pain worsens when getting up from lying on his side. Physical therapy has not provided relief.  He has frequent diarrhea, occurring 20-30 minutes after eating certain foods. Dicyclomine  10 mg twice daily has been ineffective. The diarrhea is unpredictable and sometimes results in accidents if not near a bathroom. He has not identified specific food triggers but mentions a family history of similar symptoms in his mother.  He reports a dry mouth, particularly upon waking, and occasional urinary urgency, sometimes resulting in minor incontinence. No burning during urination. He is concerned about potential kidney issues but reports no symptoms suggestive of a urinary tract infection.  His diabetes management includes Trulicity  1.5 mg weekly and Humulin  50 units, which he sometimes forgets to take twice daily. His recent A1c has increased to 9.5 from 7.5. He acknowledges dietary indiscretions, such as consuming sugary drinks, which he has recently reduced.  He mentions significant stress following the recent death of his mother, contributing to emotional distress and gambling issues.    Past Medical History:  Diagnosis Date   Diabetes mellitus without complication (HCC)    Heart failure of unknown type (HCC)  06/2011   Hypertension    Obesity    Thrombocytopenia (HCC)     No past surgical history on file.  Family History  Problem Relation Age of Onset   Heart disease Mother     Social History   Socioeconomic History   Marital status: Single    Spouse name: Not on file   Number of children: Not on file   Years of education: Not on file   Highest education level: 10th grade  Occupational History   Not on file  Tobacco Use   Smoking status: Never   Smokeless tobacco: Never  Vaping Use   Vaping status: Never Used  Substance and Sexual Activity   Alcohol  use: No   Drug use: No   Sexual activity: Not on file  Other Topics Concern   Not on file  Social History Narrative   He is married and has two daughters, was in prison years ago but released in 2008. Denies any smoking, drinking, or drugs.    Social Drivers of Health   Financial Resource Strain: Medium Risk (11/01/2023)   Overall Financial Resource Strain (CARDIA)    Difficulty of Paying Living Expenses: Somewhat hard  Food Insecurity: Food Insecurity Present (11/01/2023)   Hunger Vital Sign    Worried About Running Out of Food in the Last Year: Sometimes true    Ran Out of Food in the Last Year: Sometimes true  Transportation Needs: No Transportation Needs (11/01/2023)   PRAPARE - Administrator, Civil Service (Medical): No    Lack of Transportation (Non-Medical): No  Physical Activity: Unknown (11/01/2023)   Exercise Vital  Sign    Days of Exercise per Week: 3 days    Minutes of Exercise per Session: Not on file  Stress: Stress Concern Present (11/01/2023)   [employer] of Occupational Health - Occupational Stress Questionnaire    Feeling of Stress: To some extent  Social Connections: Moderately Integrated (11/01/2023)   Social Connection and Isolation Panel    Frequency of Communication with Friends and Family: Once a week    Frequency of Social Gatherings with Friends and Family: More than three times a week     Attends Religious Services: 1 to 4 times per year    Active Member of [employer] or Organizations: No    Attends Engineer, structural: Not on file    Marital Status: Living with partner    Allergies  Allergen Reactions   Lisinopril      cough    Outpatient Medications Prior to Visit  Medication Sig Dispense Refill   amLODipine  (NORVASC ) 10 MG tablet Take 1 tablet (10 mg total) by mouth daily. 90 tablet 1   aspirin  EC 81 MG tablet Take 81 mg by mouth daily.      atorvastatin  (LIPITOR) 20 MG tablet Take 1 tablet (20 mg total) by mouth daily. 90 tablet 1   carbamide peroxide (DEBROX) 6.5 % OTIC solution Place 5 drops into the right ear 2 (two) times daily. 15 mL 0   carvedilol  (COREG ) 25 MG tablet Take 1 tablet (25 mg total) by mouth 2 (two) times daily with a meal. 180 tablet 1   cetirizine  (ALLERGY RELIEF CETIRIZINE ) 10 MG tablet Take 1 tablet (10 mg total) by mouth daily. 30 tablet 2   clotrimazole  (LOTRIMIN ) 1 % cream Apply 1 application topically 2 (two) times daily. 30 g 0   Continuous Blood Gluc Receiver (FREESTYLE LIBRE 14 DAY READER) DEVI 1 Device by Does not apply route 3 (three) times daily. 1 each 0   Continuous Blood Gluc Sensor (FREESTYLE LIBRE 14 DAY SENSOR) MISC 1 Device by Does not apply route every 14 (fourteen) days. Use to check blood sugar daily. Use one sensor for 14 days. 2 each 6   cyclobenzaprine  (FLEXERIL ) 10 MG tablet Take 1 tablet (10 mg total) by mouth 2 (two) times daily as needed for muscle spasms. 60 tablet 2   dicyclomine  (BENTYL ) 10 MG capsule Take 1 capsule (10 mg total) by mouth 2 (two) times daily. 60 capsule 1   Dulaglutide  (TRULICITY ) 1.5 MG/0.5ML SOAJ Inject 1.5 mg into the skin once a week. 2 mL 6   EPINEPHrine  (EPIPEN  2-PAK) 0.3 mg/0.3 mL IJ SOAJ injection Inject 0.3 mLs (0.3 mg total) into the muscle once. 1 Device 0   gabapentin  (NEURONTIN ) 300 MG capsule Take 1 capsule (300 mg total) by mouth at bedtime. 90 capsule 1   glipiZIDE  (GLUCOTROL )  10 MG tablet Take 1 tablet (10 mg total) by mouth 2 (two) times daily before a meal. 180 tablet 1   ibuprofen  (ADVIL ) 600 MG tablet Take 1 tablet (600 mg total) by mouth every 6 (six) hours as needed. 30 tablet 0   ibuprofen  (ADVIL ,MOTRIN ) 600 MG tablet Take 1 tablet (600 mg total) by mouth every 6 (six) hours as needed. 30 tablet 0   insulin  isophane & regular human KwikPen (HUMULIN  70/30 KWIKPEN) (70-30) 100 UNIT/ML KwikPen Inject 50 Units into the skin 2 (two) times daily. 90 mL 6   Insulin  Pen Needle (TECHLITE PLUS PEN NEEDLES) 32G X 4 MM MISC Use as instructed to inject twice  daily. 200 each 2   Insulin  Syringe-Needle U-100 (BD INSULIN  SYRINGE ULTRAFINE) 31G X 15/64 0.5 ML MISC 1 each by Does not apply route 2 (two) times daily. 100 each 5   losartan  (COZAAR ) 100 MG tablet Take 1 tablet (100 mg total) by mouth daily. 90 tablet 1   omega-3 acid ethyl esters (LOVAZA ) 1 g capsule Take 2 capsules (2 g total) by mouth daily. 180 capsule 1   oxyCODONE -acetaminophen  (PERCOCET/ROXICET) 5-325 MG tablet Take 1 tablet by mouth every 6 (six) hours as needed for severe pain. 10 tablet 0   sildenafil  (VIAGRA ) 25 MG tablet TAKE 2 TABLETS (50 MG TOTAL) BY MOUTH DAILY AS NEEDED FOR ERECTILE DYSFUNCTION. AT LEAST 24 HOURS BETWEEN DOSES 20 tablet 2   spironolactone  (ALDACTONE ) 25 MG tablet Take 1 tablet (25 mg total) by mouth daily. 90 tablet 1   No facility-administered medications prior to visit.     ROS Review of Systems  Constitutional:  Negative for activity change and appetite change.  HENT:  Negative for sinus pressure and sore throat.   Respiratory:  Negative for chest tightness, shortness of breath and wheezing.   Cardiovascular:  Negative for chest pain and palpitations.  Gastrointestinal:  Positive for abdominal pain and diarrhea. Negative for abdominal distention and constipation.  Genitourinary: Negative.   Musculoskeletal:        See HPI  Psychiatric/Behavioral:  Negative for behavioral  problems and dysphoric mood.    *** Objective:  BP (!) 142/96   Pulse 94   Ht 6' 1 (1.854 m)   Wt 276 lb 12.8 oz (125.6 kg)   SpO2 97%   BMI 36.52 kg/m      11/02/2023    4:35 PM 05/04/2023    4:27 PM 07/24/2022    2:17 PM  BP/Weight  Systolic BP 142 114 115  Diastolic BP 96 74 74  Wt. (Lbs) 276.8 278.6 278.6  BMI 36.52 kg/m2 36.76 kg/m2 36.76 kg/m2      Physical Exam Constitutional:      Appearance: He is well-developed.  Cardiovascular:     Rate and Rhythm: Normal rate.     Heart sounds: Normal heart sounds. No murmur heard. Pulmonary:     Effort: Pulmonary effort is normal.     Breath sounds: Normal breath sounds. No wheezing or rales.  Chest:     Chest wall: No tenderness.  Abdominal:     General: Bowel sounds are normal. There is no distension.     Palpations: Abdomen is soft. There is no mass.     Tenderness: There is no abdominal tenderness.  Musculoskeletal:     Right lower leg: No edema.     Left lower leg: No edema.     Comments: No tenderness on lumbar spine palpation Negative straight leg raise bilaterally  Neurological:     Mental Status: He is alert and oriented to person, place, and time.  Psychiatric:        Mood and Affect: Mood normal.    ***    Latest Ref Rng & Units 05/04/2023    5:07 PM 06/26/2022   12:55 AM 12/11/2021    4:30 PM  CMP  Glucose 70 - 99 mg/dL 67  776  82   BUN 6 - 24 mg/dL 11  13  13    Creatinine 0.76 - 1.27 mg/dL 9.01  8.80  9.06   Sodium 134 - 144 mmol/L 147  142  147   Potassium 3.5 - 5.2 mmol/L 3.6  3.4  4.1   Chloride 96 - 106 mmol/L 105  102  108   CO2 20 - 29 mmol/L 24  26  22    Calcium  8.7 - 10.2 mg/dL 9.8  9.5  9.4   Total Protein 6.0 - 8.5 g/dL 7.2     Total Bilirubin 0.0 - 1.2 mg/dL 1.2     Alkaline Phos 44 - 121 IU/L 92     AST 0 - 40 IU/L 21     ALT 0 - 44 IU/L 18       Lipid Panel     Component Value Date/Time   CHOL 93 (L) 05/04/2023 1707   TRIG 89 05/04/2023 1707   HDL 30 (L) 05/04/2023 1707    CHOLHDL 5.1 (H) 09/05/2020 1008   CHOLHDL 5.6 (H) 05/27/2016 1024   VLDL 46 (H) 04/26/2015 1054   LDLCALC 45 05/04/2023 1707    CBC    Component Value Date/Time   WBC 9.7 06/26/2022 0055   RBC 4.76 06/26/2022 0055   HGB 14.2 06/26/2022 0055   HCT 40.9 06/26/2022 0055   PLT 108 (L) 06/26/2022 0055   MCV 85.9 06/26/2022 0055   MCH 29.8 06/26/2022 0055   MCHC 34.7 06/26/2022 0055   RDW 12.8 06/26/2022 0055   LYMPHSABS 2.2 06/26/2022 0055   MONOABS 0.7 06/26/2022 0055   EOSABS 0.0 06/26/2022 0055   BASOSABS 0.0 06/26/2022 0055    Lab Results  Component Value Date   HGBA1C 9.5 (A) 11/02/2023   Lab Results  Component Value Date   HGBA1C 9.5 (A) 11/02/2023   HGBA1C 7.5 (A) 05/04/2023   HGBA1C 7.2 (A) 07/24/2022  \    1. Type 2 diabetes mellitus with other specified complication, with long-term current use of insulin  (HCC) (Primary) *** - POCT glycosylated hemoglobin (Hb A1C)   No orders of the defined types were placed in this encounter.   Follow-up: No follow-ups on file.       Corrina Sabin, MD, FAAFP. [facility] [place], [place] [phone]   11/02/2023, 4:51 PM

## 2023-11-02 NOTE — Patient Instructions (Signed)

## 2023-11-03 ENCOUNTER — Other Ambulatory Visit: Payer: Self-pay

## 2023-11-03 ENCOUNTER — Other Ambulatory Visit: Payer: Self-pay | Admitting: Family Medicine

## 2023-11-03 ENCOUNTER — Encounter: Payer: Self-pay | Admitting: Family Medicine

## 2023-11-03 DIAGNOSIS — I5040 Unspecified combined systolic (congestive) and diastolic (congestive) heart failure: Secondary | ICD-10-CM

## 2023-11-04 ENCOUNTER — Other Ambulatory Visit: Payer: Self-pay

## 2023-11-06 ENCOUNTER — Other Ambulatory Visit: Payer: Self-pay

## 2023-11-12 ENCOUNTER — Other Ambulatory Visit: Payer: Self-pay

## 2023-11-13 ENCOUNTER — Other Ambulatory Visit: Payer: Self-pay

## 2023-11-20 ENCOUNTER — Ambulatory Visit (HOSPITAL_COMMUNITY)

## 2023-12-08 ENCOUNTER — Ambulatory Visit (HOSPITAL_COMMUNITY)
Admission: RE | Admit: 2023-12-08 | Discharge: 2023-12-08 | Disposition: A | Discharge status: Home / Self Care | Source: Ambulatory Visit | Attending: Family Medicine | Admitting: Family Medicine

## 2023-12-08 ENCOUNTER — Encounter (HOSPITAL_COMMUNITY): Payer: Self-pay

## 2023-12-08 DIAGNOSIS — I110 Hypertensive heart disease with heart failure: Secondary | ICD-10-CM

## 2023-12-09 ENCOUNTER — Other Ambulatory Visit: Payer: Self-pay

## 2023-12-09 ENCOUNTER — Ambulatory Visit: Payer: Self-pay | Admitting: Family Medicine

## 2023-12-09 ENCOUNTER — Ambulatory Visit (HOSPITAL_COMMUNITY)
Admission: RE | Admit: 2023-12-09 | Discharge: 2023-12-09 | Disposition: A | Discharge status: Home / Self Care | Source: Ambulatory Visit | Attending: Vascular Surgery | Admitting: Vascular Surgery

## 2023-12-09 DIAGNOSIS — I5040 Unspecified combined systolic (congestive) and diastolic (congestive) heart failure: Secondary | ICD-10-CM | POA: Insufficient documentation

## 2023-12-09 DIAGNOSIS — I110 Hypertensive heart disease with heart failure: Secondary | ICD-10-CM | POA: Diagnosis present

## 2023-12-09 LAB — ECHOCARDIOGRAM COMPLETE
AR max vel: 1.55 cm2
AV Area VTI: 1.47 cm2
AV Area mean vel: 1.65 cm2
AV Mean grad: 7 mmHg
AV Peak grad: 13.7 mmHg
Ao pk vel: 1.85 m/s
Area-P 1/2: 3.31 cm2
P 1/2 time: 831 ms
S' Lateral: 4.59 cm

## 2023-12-09 MED ORDER — DAPAGLIFLOZIN PROPANEDIOL 10 MG PO TABS
10.0000 mg | ORAL_TABLET | Freq: Every day | ORAL | 1 refills | Status: AC
  Filled 2023-12-09: qty 30, 30d supply, fill #0
  Filled 2024-01-19: qty 30, 30d supply, fill #1
  Filled 2024-02-18: qty 30, 30d supply, fill #2
  Filled 2024-04-04: qty 30, 30d supply, fill #3

## 2023-12-10 ENCOUNTER — Other Ambulatory Visit: Payer: Self-pay

## 2023-12-15 ENCOUNTER — Other Ambulatory Visit: Payer: Self-pay | Admitting: Family Medicine

## 2023-12-15 ENCOUNTER — Other Ambulatory Visit: Payer: Self-pay

## 2023-12-15 DIAGNOSIS — R197 Diarrhea, unspecified: Secondary | ICD-10-CM

## 2023-12-15 MED ORDER — DICYCLOMINE HCL 10 MG PO CAPS
10.0000 mg | ORAL_CAPSULE | Freq: Two times a day (BID) | ORAL | 2 refills | Status: AC
  Filled 2023-12-15: qty 60, 30d supply, fill #0
  Filled 2024-02-18: qty 60, 30d supply, fill #1
  Filled 2024-04-04: qty 60, 30d supply, fill #2

## 2023-12-16 ENCOUNTER — Other Ambulatory Visit: Payer: Self-pay

## 2023-12-17 ENCOUNTER — Other Ambulatory Visit: Payer: Self-pay

## 2023-12-18 ENCOUNTER — Other Ambulatory Visit: Payer: Self-pay

## 2024-01-19 ENCOUNTER — Other Ambulatory Visit: Payer: Self-pay

## 2024-01-19 ENCOUNTER — Other Ambulatory Visit: Payer: Self-pay | Admitting: Family Medicine

## 2024-01-19 DIAGNOSIS — E1169 Type 2 diabetes mellitus with other specified complication: Secondary | ICD-10-CM

## 2024-01-20 ENCOUNTER — Other Ambulatory Visit: Payer: Self-pay

## 2024-01-20 MED ORDER — OMEGA-3-ACID ETHYL ESTERS 1 G PO CAPS
2.0000 g | ORAL_CAPSULE | Freq: Every day | ORAL | 0 refills | Status: AC
  Filled 2024-01-20 – 2024-02-18 (×2): qty 180, 90d supply, fill #0

## 2024-01-22 ENCOUNTER — Other Ambulatory Visit: Payer: Self-pay

## 2024-01-29 ENCOUNTER — Other Ambulatory Visit: Payer: Self-pay

## 2024-02-01 ENCOUNTER — Encounter: Payer: Self-pay | Admitting: Radiology

## 2024-02-02 ENCOUNTER — Ambulatory Visit: Admitting: Family Medicine

## 2024-02-18 ENCOUNTER — Other Ambulatory Visit: Payer: Self-pay

## 2024-02-22 ENCOUNTER — Other Ambulatory Visit: Payer: Self-pay

## 2024-03-21 ENCOUNTER — Other Ambulatory Visit: Payer: Self-pay

## 2024-04-03 ENCOUNTER — Other Ambulatory Visit: Payer: Self-pay | Admitting: Family Medicine

## 2024-04-03 DIAGNOSIS — G8929 Other chronic pain: Secondary | ICD-10-CM

## 2024-04-04 ENCOUNTER — Other Ambulatory Visit: Payer: Self-pay

## 2024-04-04 MED ORDER — DULOXETINE HCL 60 MG PO CPEP
60.0000 mg | ORAL_CAPSULE | Freq: Every day | ORAL | 3 refills | Status: AC
  Filled 2024-04-04: qty 30, 30d supply, fill #0

## 2024-04-04 MED ORDER — CYCLOBENZAPRINE HCL 10 MG PO TABS
10.0000 mg | ORAL_TABLET | Freq: Two times a day (BID) | ORAL | 2 refills | Status: AC | PRN
  Filled 2024-04-04: qty 60, 30d supply, fill #0

## 2024-04-05 ENCOUNTER — Other Ambulatory Visit: Payer: Self-pay

## 2024-04-07 ENCOUNTER — Other Ambulatory Visit: Payer: Self-pay

## 2024-04-15 ENCOUNTER — Other Ambulatory Visit: Payer: Self-pay
